# Patient Record
Sex: Male | Born: 1955 | Race: White | Hispanic: No | Marital: Married | Smoking: Former smoker
Health system: Southern US, Community
[De-identification: ages and names within clinical notes are randomized; demographics above are authoritative.]

## PROBLEM LIST (undated history)

## (undated) ENCOUNTER — Encounter: Attending: Cardiovascular Disease | Primary: Cardiovascular Disease

## (undated) ENCOUNTER — Encounter
Attending: Student in an Organized Health Care Education/Training Program | Primary: Student in an Organized Health Care Education/Training Program

## (undated) ENCOUNTER — Ambulatory Visit

## (undated) ENCOUNTER — Ambulatory Visit: Payer: MEDICARE | Attending: Cardiovascular Disease | Primary: Cardiovascular Disease

## (undated) ENCOUNTER — Telehealth

## (undated) ENCOUNTER — Ambulatory Visit: Payer: Medicare (Managed Care) | Attending: Cardiovascular Disease | Primary: Cardiovascular Disease

## (undated) ENCOUNTER — Encounter

## (undated) ENCOUNTER — Ambulatory Visit: Attending: Foot and Ankle Surgery | Primary: Foot and Ankle Surgery

## (undated) ENCOUNTER — Ambulatory Visit
Payer: Medicare (Managed Care) | Attending: Student in an Organized Health Care Education/Training Program | Primary: Student in an Organized Health Care Education/Training Program

## (undated) ENCOUNTER — Ambulatory Visit: Attending: Family Medicine | Primary: Family Medicine

## (undated) ENCOUNTER — Ambulatory Visit: Attending: Pharmacist | Primary: Pharmacist

## (undated) DIAGNOSIS — E7800 Pure hypercholesterolemia, unspecified: Secondary | ICD-10-CM

## (undated) DIAGNOSIS — G8929 Other chronic pain: Secondary | ICD-10-CM

## (undated) DIAGNOSIS — I739 Peripheral vascular disease, unspecified: Secondary | ICD-10-CM

## (undated) DIAGNOSIS — M1990 Unspecified osteoarthritis, unspecified site: Secondary | ICD-10-CM

## (undated) DIAGNOSIS — I213 ST elevation (STEMI) myocardial infarction of unspecified site: Secondary | ICD-10-CM

## (undated) DIAGNOSIS — I700 Atherosclerosis of aorta: Secondary | ICD-10-CM

## (undated) DIAGNOSIS — G4730 Sleep apnea, unspecified: Secondary | ICD-10-CM

## (undated) DIAGNOSIS — I209 Angina pectoris, unspecified: Secondary | ICD-10-CM

## (undated) DIAGNOSIS — Z972 Presence of dental prosthetic device (complete) (partial): Secondary | ICD-10-CM

## (undated) DIAGNOSIS — D171 Benign lipomatous neoplasm of skin and subcutaneous tissue of trunk: Secondary | ICD-10-CM

## (undated) DIAGNOSIS — G4733 Obstructive sleep apnea (adult) (pediatric): Secondary | ICD-10-CM

## (undated) DIAGNOSIS — N200 Calculus of kidney: Secondary | ICD-10-CM

## (undated) DIAGNOSIS — D696 Thrombocytopenia, unspecified: Secondary | ICD-10-CM

## (undated) DIAGNOSIS — E119 Type 2 diabetes mellitus without complications: Secondary | ICD-10-CM

## (undated) DIAGNOSIS — Z7902 Long term (current) use of antithrombotics/antiplatelets: Secondary | ICD-10-CM

## (undated) DIAGNOSIS — I10 Essential (primary) hypertension: Secondary | ICD-10-CM

## (undated) DIAGNOSIS — I2510 Atherosclerotic heart disease of native coronary artery without angina pectoris: Secondary | ICD-10-CM

## (undated) DIAGNOSIS — I771 Stricture of artery: Secondary | ICD-10-CM

## (undated) DIAGNOSIS — Z87442 Personal history of urinary calculi: Secondary | ICD-10-CM

## (undated) DIAGNOSIS — M19071 Primary osteoarthritis, right ankle and foot: Secondary | ICD-10-CM

## (undated) DIAGNOSIS — E785 Hyperlipidemia, unspecified: Secondary | ICD-10-CM

## (undated) DIAGNOSIS — K219 Gastro-esophageal reflux disease without esophagitis: Secondary | ICD-10-CM

## (undated) HISTORY — PX: APPENDECTOMY: SHX54

## (undated) HISTORY — DX: Hyperlipidemia, unspecified: E78.5

## (undated) HISTORY — PX: ESOPHAGOGASTRODUODENOSCOPY: SHX1529

## (undated) HISTORY — PX: COLONOSCOPY: SHX174

## (undated) HISTORY — PX: OTHER SURGICAL HISTORY: SHX169

## (undated) HISTORY — DX: Thrombocytopenia, unspecified: D69.6

## (undated) HISTORY — PX: NISSEN FUNDOPLICATION: SHX2091

## (undated) HISTORY — PX: HERNIA REPAIR: SHX51

## (undated) HISTORY — DX: Stricture of artery: I77.1

## (undated) HISTORY — PX: BACK SURGERY: SHX140

## (undated) HISTORY — PX: LUMBAR DISC SURGERY: SHX700

## (undated) HISTORY — DX: Personal history of urinary calculi: Z87.442

## (undated) HISTORY — PX: COLONOSCOPY WITH ESOPHAGOGASTRODUODENOSCOPY (EGD): SHX5779

## (undated) HISTORY — PX: FRACTURE SURGERY: SHX138

---

## 1980-06-30 HISTORY — PX: OTHER SURGICAL HISTORY: SHX169

## 2004-12-10 ENCOUNTER — Ambulatory Visit: Payer: Self-pay | Admitting: Unknown Physician Specialty

## 2004-12-12 ENCOUNTER — Ambulatory Visit: Payer: Self-pay | Admitting: Unknown Physician Specialty

## 2004-12-16 ENCOUNTER — Ambulatory Visit: Payer: Self-pay | Admitting: Unknown Physician Specialty

## 2004-12-19 ENCOUNTER — Ambulatory Visit: Payer: Self-pay | Admitting: Unknown Physician Specialty

## 2005-01-08 ENCOUNTER — Ambulatory Visit: Payer: Self-pay | Admitting: Unknown Physician Specialty

## 2005-01-21 ENCOUNTER — Inpatient Hospital Stay: Payer: Self-pay | Admitting: Surgery

## 2005-06-11 ENCOUNTER — Ambulatory Visit: Payer: Self-pay | Admitting: Surgery

## 2006-07-29 ENCOUNTER — Ambulatory Visit: Payer: Self-pay

## 2007-03-25 ENCOUNTER — Ambulatory Visit: Payer: Self-pay | Admitting: Surgery

## 2007-04-23 ENCOUNTER — Other Ambulatory Visit: Payer: Self-pay

## 2007-04-23 ENCOUNTER — Observation Stay: Payer: Self-pay | Admitting: Internal Medicine

## 2008-01-29 DIAGNOSIS — M5126 Other intervertebral disc displacement, lumbar region: Secondary | ICD-10-CM

## 2008-01-29 HISTORY — DX: Other intervertebral disc displacement, lumbar region: M51.26

## 2008-02-10 ENCOUNTER — Ambulatory Visit (HOSPITAL_COMMUNITY): Admission: RE | Admit: 2008-02-10 | Discharge: 2008-02-11 | Payer: Self-pay | Admitting: Orthopedic Surgery

## 2008-02-10 HISTORY — PX: LUMBAR DISC SURGERY: SHX700

## 2010-10-29 HISTORY — PX: LUMBAR LAMINECTOMY/DECOMPRESSION MICRODISCECTOMY: SHX5026

## 2010-11-12 NOTE — Op Note (Signed)
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   OPERATIVE PROCEDURE:  Lumbar laminectomy for diskectomy, CPT code 6030.   COMPLICATIONS:  None.   SURGEON:  Dahari D. Shon Baton, MD   FIRST ASSISTANT:  Crissie Reese, PA   HISTORY:  This is a very pleasant 55 year old gentleman who had acute  onset of severe left leg pain, weakness in his EHL, and positive nerve  root tension sign.  He had a previous L5-S1 disk herniation which did  well.  Because of the new radiculopathy, a new MRI was done which  demonstrated a central small disk protrusion at L5-S1 with no  significant neural compromise, but a significant large left-sided disk  herniation at L4-5 with compression of the traversing L5 nerve root.  After discussing treatment options, the patient elected to proceed with  surgery because of the radicular pain.  All appropriate risks, benefits,  and alternatives were discussed with the patient and consent was  obtained.   OPERATIVE NOTE:  The patient was brought to the operating room and  placed supine on the operating table.  After successful induction of  general anesthesia and endotracheal intubation, TED and SCDs were  applied.  He was turned prone onto a Wilson frame.  All bony prominences  were well padded and the back was prepped and draped in standard  fashion.  The previous incision was identified.  I then extended that  incision caudally cranially.  Sharp dissection was carried out down to  the deep fascia.  The deep fascia was sharply incised.  Then, I  used the  Cobb elevator to dissect the paraspinal muscles off the L4-5 spinous  processes.  At this point, I exposed the 4-5 interbody, the 4-5  ligamentum flavum, and the 4-5 space on the left side.  It should be  noted that prior to incision, we did do appropriate time-out to confirm  the appropriate level and I rechecked again with the MRI.   At this point with the 4-5 space exposed, I placed a Penfield 4  underneath the lamina of L4 and took an intraoperative x-ray, thereby  confirming that I was at the 4-5 space.  Once confirmed, I then proceed  with surgery.  A Taylor retractor was placed on the lateral aspect facet  joint in order to have exposure.  I then used a small curette to expand  a plane underneath the lamina and used a 3-mm Kerrison to perform a  small laminotomy of L4.  I then resected the ligamentum flavum to expose  the underlying thecal sac.  The five nerve root and the thecal sac was  dorsally displaced due to the  fragmented disk.  I could easily maneuver  the thecal sac out of the way and protect it with the D'Errico to expose  the very large posterolateral disk fragment.  At this point, I  coagulated the traversing epidural veins with a bipolar and then incised  the annulus with a 15-blade scalpel.  Using a combination of  micropituitary rongeurs and a regular 2-mm pituitary rongeur, I was able  to resect the large fragment of the disk material.  I then took the  micropituitary rongeur and entered into the actual disk space to remove  any loose fragments of disk material.   At this point, I had an adequate amount of disk material excised and so  I was swept circumferentially with an extended long nerve hook  underneath the thecal sac superiorly in the lateral recess and  inferiorly and then down along the lateral recess where the L5 nerve  root was.  At this point, there was no retained fragments of disk  material and there was no undue tension on the nerve root.   I then took  a dural elevator (hockey stick) and which gave me a longer extension  down to ensure that the L5 traversing nerve root was free of any  tension.  Once I had confirmed this, I irrigated copiously with normal  saline and then took another Epstein curette just to palpate directly  underneath the annulus to ensure that I had no free fragments of disk  material.  Once I was assured that I had all the fragments of disk  material out, I then checked the epidural veins, coagulating anything  that was bleeding with bipolar electrocautery, and then irrigated  copiously with normal saline.  I then used FloSeal to maintain  hemostasis, closed the deep fascia with interrupted #1 Vicryl sutures,  superficial with 2-0 Vicryl sutures, and a 3-0 Monocryl for the skin.  At the end of the case, all needle and sponge counts were counted and  were noted to be correct.  The patient after the dry dressing was  applied was transferred to the PACU without incident.      Alvy Beal, MD  Electronically Signed     DDB/MEDQ  D:  02/10/2008  T:  02/10/2008  Job:  [id number]

## 2010-11-20 ENCOUNTER — Ambulatory Visit: Payer: Self-pay | Admitting: Unknown Physician Specialty

## 2011-03-28 LAB — CBC
MCHC: 34.4
MCHC: 34.4
MCV: 90.8
Platelets: 132 — ABNORMAL LOW
RBC: 4.59
RDW: 13.8
WBC: 14.5 — ABNORMAL HIGH

## 2011-03-28 LAB — BASIC METABOLIC PANEL
BUN: 17
CO2: 25
CO2: 26
Calcium: 9
Chloride: 110
Creatinine, Ser: 0.64
Creatinine, Ser: 0.73
GFR calc Af Amer: >60
GFR calc non Af Amer: >60
Glucose, Bld: 130 — ABNORMAL HIGH

## 2011-06-10 ENCOUNTER — Ambulatory Visit: Payer: Self-pay | Admitting: Unknown Physician Specialty

## 2011-06-10 ENCOUNTER — Inpatient Hospital Stay: Payer: Self-pay | Admitting: Internal Medicine

## 2012-04-02 ENCOUNTER — Ambulatory Visit: Payer: Self-pay | Admitting: Unknown Physician Specialty

## 2013-03-06 ENCOUNTER — Emergency Department: Payer: Self-pay | Admitting: Emergency Medicine

## 2013-03-06 LAB — BASIC METABOLIC PANEL
BUN: 15 mg/dL (ref 7–18)
Chloride: 111 mmol/L — ABNORMAL HIGH (ref 98–107)
Co2: 25 mmol/L (ref 21–32)
Creatinine: 0.79 mg/dL (ref 0.60–1.30)
EGFR (Non-African Amer.): >60
Glucose: 146 mg/dL — ABNORMAL HIGH (ref 65–99)
Potassium: 4 mmol/L (ref 3.5–5.1)
Sodium: 142 mmol/L (ref 136–145)

## 2013-03-06 LAB — CBC
HCT: 44.4 % (ref 40.0–52.0)
MCH: 31.1 pg (ref 26.0–34.0)
MCHC: 34.3 g/dL (ref 32.0–36.0)
MCV: 91 fL (ref 80–100)
WBC: 7 10*3/uL (ref 3.8–10.6)

## 2013-03-06 LAB — TROPONIN I: Troponin-I: <0.02 ng/mL

## 2013-03-07 ENCOUNTER — Telehealth: Payer: Self-pay

## 2013-03-07 NOTE — Telephone Encounter (Signed)
Pt states he his seeing his current cardiologist on this Wednedsay. Pt would not give name of who he was seeing.

## 2013-03-07 NOTE — Telephone Encounter (Signed)
Called pt to schedule a follow up from ED over the weekend, pt states

## 2014-04-11 ENCOUNTER — Ambulatory Visit: Payer: Self-pay | Admitting: Urology

## 2014-08-01 DIAGNOSIS — E119 Type 2 diabetes mellitus without complications: Secondary | ICD-10-CM | POA: Insufficient documentation

## 2014-12-25 ENCOUNTER — Encounter: Payer: Self-pay | Admitting: *Deleted

## 2014-12-25 ENCOUNTER — Ambulatory Visit
Admission: RE | Admit: 2014-12-25 | Discharge: 2014-12-25 | Disposition: A | Discharge status: Home / Self Care | Payer: Medicare Other | Source: Ambulatory Visit | Attending: Unknown Physician Specialty | Admitting: Unknown Physician Specialty

## 2014-12-25 ENCOUNTER — Encounter
Admission: RE | Disposition: A | Discharge status: Home / Self Care | Payer: Self-pay | Source: Ambulatory Visit | Attending: Unknown Physician Specialty

## 2014-12-25 ENCOUNTER — Ambulatory Visit: Payer: Medicare Other | Admitting: Anesthesiology

## 2014-12-25 DIAGNOSIS — F17290 Nicotine dependence, other tobacco product, uncomplicated: Secondary | ICD-10-CM | POA: Diagnosis not present

## 2014-12-25 DIAGNOSIS — Z9049 Acquired absence of other specified parts of digestive tract: Secondary | ICD-10-CM | POA: Insufficient documentation

## 2014-12-25 DIAGNOSIS — Z9889 Other specified postprocedural states: Secondary | ICD-10-CM | POA: Insufficient documentation

## 2014-12-25 DIAGNOSIS — K317 Polyp of stomach and duodenum: Secondary | ICD-10-CM | POA: Diagnosis present

## 2014-12-25 DIAGNOSIS — Z79899 Other long term (current) drug therapy: Secondary | ICD-10-CM | POA: Insufficient documentation

## 2014-12-25 DIAGNOSIS — K219 Gastro-esophageal reflux disease without esophagitis: Secondary | ICD-10-CM | POA: Diagnosis not present

## 2014-12-25 DIAGNOSIS — K2950 Unspecified chronic gastritis without bleeding: Secondary | ICD-10-CM | POA: Insufficient documentation

## 2014-12-25 DIAGNOSIS — K319 Disease of stomach and duodenum, unspecified: Secondary | ICD-10-CM | POA: Diagnosis not present

## 2014-12-25 HISTORY — PX: ESOPHAGOGASTRODUODENOSCOPY (EGD) WITH PROPOFOL: SHX5813

## 2014-12-25 HISTORY — DX: Gastro-esophageal reflux disease without esophagitis: K21.9

## 2014-12-25 SURGERY — ESOPHAGOGASTRODUODENOSCOPY (EGD) WITH PROPOFOL
Anesthesia: General

## 2014-12-25 MED ORDER — LACTATED RINGERS IV SOLN
INTRAVENOUS | Status: DC
  Administered 2014-12-25: 10:00:00 via INTRAVENOUS

## 2014-12-25 MED ORDER — SODIUM CHLORIDE 0.9 % IV SOLN: INTRAVENOUS | Status: DC

## 2014-12-25 MED ORDER — PROPOFOL 10 MG/ML IV BOLUS
INTRAVENOUS | Status: DC | PRN
  Administered 2014-12-25: 50 mg via INTRAVENOUS

## 2014-12-25 MED ORDER — GLYCOPYRROLATE 0.2 MG/ML IJ SOLN
INTRAMUSCULAR | Status: DC | PRN
  Administered 2014-12-25: 0.2 mg via INTRAVENOUS

## 2014-12-25 MED ORDER — MIDAZOLAM HCL 5 MG/5ML IJ SOLN
INTRAMUSCULAR | Status: DC | PRN
  Administered 2014-12-25: 1 mg via INTRAVENOUS

## 2014-12-25 MED ORDER — PROPOFOL INFUSION 10 MG/ML OPTIME
INTRAVENOUS | Status: DC | PRN
  Administered 2014-12-25: 150 ug/kg/min via INTRAVENOUS

## 2014-12-25 MED ORDER — LIDOCAINE HCL (PF) 2 % IJ SOLN
INTRAMUSCULAR | Status: DC | PRN
  Administered 2014-12-25: 50 mg

## 2014-12-25 MED ORDER — FENTANYL CITRATE (PF) 100 MCG/2ML IJ SOLN
INTRAMUSCULAR | Status: DC | PRN
  Administered 2014-12-25: 50 ug via INTRAVENOUS

## 2014-12-25 NOTE — H&P (Signed)
Primary Care Physician:  Beaulah DinningFeldstein, Diana, MD Primary Gastroenterologist:  Dr. Mechele CollinElliott  Pre-Procedure History & Physical: HPI:  Curtis Sherman is a 59 y.o. male is here for an    Past Medical History  Diagnosis Date  . GERD (gastroesophageal reflux disease)     Past Surgical History  Procedure Laterality Date  . Back surgery    . Appendectomy    . Nissen fundoplication    . Fracture surgery      Prior to Admission medications   Medication Sig Start Date End Date Taking? Authorizing Provider  esomeprazole (NEXIUM) 20 MG capsule Take 20 mg by mouth daily at 12 noon.   Yes Historical Provider, MD  ezetimibe-simvastatin (VYTORIN) 10-40 MG per tablet Take 1 tablet by mouth daily.   Yes Historical Provider, MD    Allergies as of 11/03/2014  . (Not on File)    History reviewed. No pertinent family history.  History   Social History  . Marital Status: Married    Spouse Name: N/A  . Number of Children: N/A  . Years of Education: N/A   Occupational History  . Not on file.   Social History Main Topics  . Smoking status: Current Every Day Smoker    Types: Cigars  . Smokeless tobacco: Not on file  . Alcohol Use: No  . Drug Use: No  . Sexual Activity: Not on file   Other Topics Concern  . Not on file   Social History Narrative  . No narrative on file    Review of Systems: See HPI, otherwise negative ROS  Physical Exam: BP 124/69 mmHg  Pulse 51  Temp(Src) 96.6 F (35.9 C) (Tympanic)  Resp 18  Ht 6\' 1"  (1.854 m)  Wt 81.647 kg (180 lb)  BMI 23.75 kg/m2  SpO2 98% General:   Alert,  pleasant and cooperative in NAD Head:  Normocephalic and atraumatic. Neck:  Supple; no masses or thyromegaly. Lungs:  Clear throughout to auscultation.    Heart:  Regular rate and rhythm. Abdomen:  Soft, nontender and nondistended. Normal bowel sounds, without guarding, and without rebound.   Neurologic:  Alert and  oriented x4;  grossly normal  neurologically.  Impression/Plan: Curtis Sherman is here for an endoscopy to be performed for follow up duodenal polyp  Risks, benefits, limitations, and alternatives regarding  endoscopy have been reviewed with the patient.  Questions have been answered.  All parties agreeable.   Lynnae PrudeELLIOTT, Brynley Cuddeback, MD  12/25/2014, 10:10 AM   Primary Care Physician:  Beaulah DinningFeldstein, Diana, MD Primary Gastroenterologist:  Dr. Mechele CollinElliott  Pre-Procedure History & Physical: HPI:  Curtis Sherman is a 59 y.o. male is here for an endoscopy.   Past Medical History  Diagnosis Date  . GERD (gastroesophageal reflux disease)     Past Surgical History  Procedure Laterality Date  . Back surgery    . Appendectomy    . Nissen fundoplication    . Fracture surgery      Prior to Admission medications   Medication Sig Start Date End Date Taking? Authorizing Provider  esomeprazole (NEXIUM) 20 MG capsule Take 20 mg by mouth daily at 12 noon.   Yes Historical Provider, MD  ezetimibe-simvastatin (VYTORIN) 10-40 MG per tablet Take 1 tablet by mouth daily.   Yes Historical Provider, MD    Allergies as of 11/03/2014  . (Not on File)    History reviewed. No pertinent family history.  History   Social History  . Marital Status: Married  Spouse Name: N/A  . Number of Children: N/A  . Years of Education: N/A   Occupational History  . Not on file.   Social History Main Topics  . Smoking status: Current Every Day Smoker    Types: Cigars  . Smokeless tobacco: Not on file  . Alcohol Use: No  . Drug Use: No  . Sexual Activity: Not on file   Other Topics Concern  . Not on file   Social History Narrative  . No narrative on file    Review of Systems: See HPI, otherwise negative ROS  Physical Exam: BP 124/69 mmHg  Pulse 51  Temp(Src) 96.6 F (35.9 C) (Tympanic)  Resp 18  Ht  (1.854 m)  Wt 81.647 kg (180 lb)  BMI 23.75 kg/m2  SpO2 98% General:   Alert,  pleasant and cooperative in  NAD Head:  Normocephalic and atraumatic. Neck:  Supple; no masses or thyromegaly. Lungs:  Clear throughout to auscultation.    Heart:  Regular rate and rhythm. Abdomen:  Soft, nontender and nondistended. Normal bowel sounds, without guarding, and without rebound.   Neurologic:  Alert and  oriented x4;  grossly normal neurologically.  Impression/Plan: Curtis Sherman is here for an endoscopy to be performed for follow up duodenal polyp  Risks, benefits, limitations, and alternatives regarding  endoscopy have been reviewed with the patient.  Questions have been answered.  All parties agreeable.   Lynnae Prude, MD  12/25/2014, 10:10 AM

## 2014-12-25 NOTE — Op Note (Signed)
[facility] Patient Name: Curtis Sherman Procedure Date: 12/25/2014 10:15 AM MRN: [id number] Account #: [id number] Date of Birth: 19-Nov-1955 Admit Type: Outpatient Age: 59 Room: [facility] ENDO ROOM 1 Gender: Male Note Status: Finalized Procedure:         Upper GI endoscopy Indications:       follow up carcinoid of duodenum Providers:         Scot Jun, MD Referring MD:      Marina Goodell (Referring MD) Medicines:         Propofol per Anesthesia Complications:     No immediate complications. Procedure:         Pre-Anesthesia Assessment:                    - After reviewing the risks and benefits, the patient was                     deemed in satisfactory condition to undergo the procedure.                    After obtaining informed consent, the endoscope was passed                     under direct vision. Throughout the procedure, the                     patient's blood pressure, pulse, and oxygen saturations                     were monitored continuously. The Olympus GIF-160 endoscope                     (S#. [id number]) was introduced through the mouth, and                     advanced to the second part of duodenum. The upper GI                     endoscopy was accomplished without difficulty. The patient                     tolerated the procedure well. Findings:      The examined esophagus was normal. GEJ 43cm      Multiple localized, small non-bleeding erosions were found in the       gastric body. There were no stigmata of recent bleeding. Biopsies were       taken with a cold forceps for histology. Biopsies were taken with a cold       forceps for Helicobacter pylori testing.      Patchy mildly erythematous mucosa without active bleeding and with no       stigmata of bleeding was found in the duodenal bulb. Impression:        - Normal esophagus.                    - Non-bleeding erosive gastropathy. Biopsied.                    -  Normal examined duodenum. Recommendation:    - Await pathology results. Scot Jun, MD 12/25/2014 10:36:57 AM This report has been signed electronically. Number of Addenda: 0 Note Initiated On: 12/25/2014 10:15 AM      [facility]

## 2014-12-25 NOTE — Transfer of Care (Signed)
Immediate Anesthesia Transfer of Care Note  Patient: Curtis Sherman  Procedure(s) Performed: Procedure(s): ESOPHAGOGASTRODUODENOSCOPY (EGD) WITH PROPOFOL (N/A)  Patient Location: PACU  Anesthesia Type:General  Level of Consciousness: awake and alert   Airway & Oxygen Therapy: Patient Spontanous Breathing  Post-op Assessment: Report given to RN and Post -op Vital signs reviewed and stable  Post vital signs: Reviewed and stable  Last Vitals:  Filed Vitals:   12/25/14 1038  BP: 112/77  Pulse: 58  Temp:   Resp: 14    Complications: No apparent anesthesia complications

## 2014-12-25 NOTE — Anesthesia Preprocedure Evaluation (Signed)
Anesthesia Evaluation  Patient identified by MRN, date of birth, ID band Patient awake    Reviewed: Allergy & Precautions, H&P , NPO status , Patient's Chart, lab work & pertinent test results, reviewed documented beta blocker date and time   Airway Mallampati: II  TM Distance: >3 FB Neck ROM: full    Dental no notable dental hx.    Pulmonary neg pulmonary ROS, Current Smoker,  breath sounds clear to auscultation  Pulmonary exam normal       Cardiovascular Exercise Tolerance: Good negative cardio ROS  Rhythm:regular Rate:Normal     Neuro/Psych negative neurological ROS  negative psych ROS   GI/Hepatic negative GI ROS, Neg liver ROS, GERD-  ,  Endo/Other  negative endocrine ROS  Renal/GU negative Renal ROS  negative genitourinary   Musculoskeletal   Abdominal   Peds  Hematology negative hematology ROS (+)   Anesthesia Other Findings   Reproductive/Obstetrics negative OB ROS                             Anesthesia Physical Anesthesia Plan  ASA: II  Anesthesia Plan: General   Post-op Pain Management:    Induction:   Airway Management Planned:   Additional Equipment:   Intra-op Plan:   Post-operative Plan:   Informed Consent: I have reviewed the patients History and Physical, chart, labs and discussed the procedure including the risks, benefits and alternatives for the proposed anesthesia with the patient or authorized representative who has indicated his/her understanding and acceptance.   Dental Advisory Given  Plan Discussed with: CRNA  Anesthesia Plan Comments:         Anesthesia Quick Evaluation

## 2014-12-26 LAB — SURGICAL PATHOLOGY

## 2014-12-26 NOTE — Anesthesia Postprocedure Evaluation (Signed)
  Anesthesia Post-op Note  Patient: Curtis Sherman  Procedure(s) Performed: Procedure(s): ESOPHAGOGASTRODUODENOSCOPY (EGD) WITH PROPOFOL (N/A)  Anesthesia type:General  Patient location: PACU  Post pain: Pain level controlled  Post assessment: Post-op Vital signs reviewed, Patient's Cardiovascular Status Stable, Respiratory Function Stable, Patent Airway and No signs of Nausea or vomiting  Post vital signs: Reviewed and stable  Last Vitals:  Filed Vitals:   12/25/14 1100  BP: 138/85  Pulse: 54  Temp:   Resp: 19    Level of consciousness: awake, alert  and patient cooperative  Complications: No apparent anesthesia complications

## 2015-01-31 DIAGNOSIS — G8929 Other chronic pain: Secondary | ICD-10-CM | POA: Insufficient documentation

## 2015-01-31 DIAGNOSIS — M549 Dorsalgia, unspecified: Secondary | ICD-10-CM

## 2016-11-04 ENCOUNTER — Emergency Department
Admission: EM | Admit: 2016-11-04 | Discharge: 2016-11-04 | Disposition: A | Discharge status: Home / Self Care | Payer: Medicare HMO | Attending: Emergency Medicine | Admitting: Emergency Medicine

## 2016-11-04 ENCOUNTER — Emergency Department: Payer: Medicare HMO

## 2016-11-04 DIAGNOSIS — Y929 Unspecified place or not applicable: Secondary | ICD-10-CM | POA: Diagnosis not present

## 2016-11-04 DIAGNOSIS — S6992XA Unspecified injury of left wrist, hand and finger(s), initial encounter: Secondary | ICD-10-CM | POA: Diagnosis present

## 2016-11-04 DIAGNOSIS — W268XXA Contact with other sharp object(s), not elsewhere classified, initial encounter: Secondary | ICD-10-CM | POA: Diagnosis not present

## 2016-11-04 DIAGNOSIS — M659 Synovitis and tenosynovitis, unspecified: Secondary | ICD-10-CM | POA: Insufficient documentation

## 2016-11-04 DIAGNOSIS — F17290 Nicotine dependence, other tobacco product, uncomplicated: Secondary | ICD-10-CM | POA: Diagnosis not present

## 2016-11-04 DIAGNOSIS — S66992A Other injury of unspecified muscle, fascia and tendon at wrist and hand level, left hand, initial encounter: Secondary | ICD-10-CM

## 2016-11-04 DIAGNOSIS — Y999 Unspecified external cause status: Secondary | ICD-10-CM | POA: Insufficient documentation

## 2016-11-04 DIAGNOSIS — S61432A Puncture wound without foreign body of left hand, initial encounter: Secondary | ICD-10-CM | POA: Insufficient documentation

## 2016-11-04 DIAGNOSIS — Y9389 Activity, other specified: Secondary | ICD-10-CM | POA: Insufficient documentation

## 2016-11-04 DIAGNOSIS — Z23 Encounter for immunization: Secondary | ICD-10-CM | POA: Diagnosis not present

## 2016-11-04 LAB — CBC WITH DIFFERENTIAL/PLATELET
Basophils Absolute: 0.1 10*3/uL (ref 0–0.1)
Basophils Relative: 1 %
EOS ABS: 0.2 10*3/uL (ref 0–0.7)
Eosinophils Relative: 2 %
HEMATOCRIT: 49.3 % (ref 40.0–52.0)
HEMOGLOBIN: 17.1 g/dL (ref 13.0–18.0)
LYMPHS PCT: 28 %
Lymphs Abs: 2.5 10*3/uL (ref 1.0–3.6)
MCH: 30.8 pg (ref 26.0–34.0)
MCHC: 34.6 g/dL (ref 32.0–36.0)
MCV: 88.9 fL (ref 80.0–100.0)
MONO ABS: 0.7 10*3/uL (ref 0.2–1.0)
Monocytes Relative: 8 %
Neutro Abs: 5.7 10*3/uL (ref 1.4–6.5)
Neutrophils Relative %: 61 %
Platelets: 130 10*3/uL — ABNORMAL LOW (ref 150–440)
RBC: 5.55 MIL/uL (ref 4.40–5.90)
RDW: 13.7 % (ref 11.5–14.5)
WBC: 9.1 10*3/uL (ref 3.8–10.6)

## 2016-11-04 LAB — BASIC METABOLIC PANEL
Anion gap: 8 (ref 5–15)
BUN: 10 mg/dL (ref 6–20)
CALCIUM: 9.7 mg/dL (ref 8.9–10.3)
CO2: 27 mmol/L (ref 22–32)
Chloride: 104 mmol/L (ref 101–111)
Creatinine, Ser: 0.71 mg/dL (ref 0.61–1.24)
GFR calc Af Amer: >60 mL/min (ref >60)
GFR calc non Af Amer: >60 mL/min (ref >60)
GLUCOSE: 88 mg/dL (ref 65–99)
POTASSIUM: 4 mmol/L (ref 3.5–5.1)
SODIUM: 139 mmol/L (ref 135–145)

## 2016-11-04 MED ORDER — CEFTRIAXONE SODIUM IN DEXTROSE 20 MG/ML IV SOLN
1.0000 g | Freq: Once | INTRAVENOUS | Status: AC
  Administered 2016-11-04: 1 g via INTRAVENOUS
  Filled 2016-11-04: qty 50

## 2016-11-04 MED ORDER — AMOXICILLIN-POT CLAVULANATE 875-125 MG PO TABS: 1.0000 | ORAL_TABLET | Freq: Two times a day (BID) | ORAL | 0 refills | Status: DC

## 2016-11-04 MED ORDER — KETOROLAC TROMETHAMINE 10 MG PO TABS: 10.0000 mg | ORAL_TABLET | Freq: Three times a day (TID) | ORAL | 0 refills | Status: DC

## 2016-11-04 MED ORDER — ORPHENADRINE CITRATE 30 MG/ML IJ SOLN
60.0000 mg | INTRAMUSCULAR | Status: AC
  Administered 2016-11-04: 60 mg via INTRAMUSCULAR
  Filled 2016-11-04: qty 2

## 2016-11-04 MED ORDER — SULFAMETHOXAZOLE-TRIMETHOPRIM 800-160 MG PO TABS: 1.0000 | ORAL_TABLET | Freq: Two times a day (BID) | ORAL | 0 refills | Status: DC

## 2016-11-04 MED ORDER — KETOROLAC TROMETHAMINE 30 MG/ML IJ SOLN
30.0000 mg | Freq: Once | INTRAMUSCULAR | Status: AC
  Administered 2016-11-04: 30 mg via INTRAVENOUS
  Filled 2016-11-04: qty 1

## 2016-11-04 MED ORDER — TETANUS-DIPHTH-ACELL PERTUSSIS 5-2.5-18.5 LF-MCG/0.5 IM SUSP
0.5000 mL | Freq: Once | INTRAMUSCULAR | Status: AC
  Administered 2016-11-04: 0.5 mL via INTRAMUSCULAR
  Filled 2016-11-04: qty 0.5

## 2016-11-04 MED ORDER — CYCLOBENZAPRINE HCL 5 MG PO TABS: 5.0000 mg | ORAL_TABLET | Freq: Three times a day (TID) | ORAL | 0 refills | Status: DC | PRN
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
## 2016-11-04 NOTE — ED Notes (Signed)
D/c inst to pt.  Pt signed esignature.  Iv d/ced 
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
## 2016-11-04 NOTE — Discharge Instructions (Signed)
You are being treated for a severe inflammatory response after a puncture wound to the palm of the hand. This puncture caused injury and inflammation to the tendons of the fingers. Take the antibiotics as directed. Take the pain medicines as needed. Call Dr. Ernest PineHooten for wound check and follow-up tomorrow. Return to the ED for any concerning symptoms or developments in the interim.
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
## 2017-06-02 ENCOUNTER — Emergency Department
Admission: EM | Admit: 2017-06-02 | Discharge: 2017-06-02 | Disposition: A | Discharge status: Home / Self Care | Payer: No Typology Code available for payment source | Attending: Emergency Medicine | Admitting: Emergency Medicine

## 2017-06-02 ENCOUNTER — Encounter: Payer: Self-pay | Admitting: Emergency Medicine

## 2017-06-02 ENCOUNTER — Other Ambulatory Visit: Payer: Self-pay

## 2017-06-02 ENCOUNTER — Emergency Department: Payer: No Typology Code available for payment source

## 2017-06-02 DIAGNOSIS — R200 Anesthesia of skin: Secondary | ICD-10-CM | POA: Diagnosis not present

## 2017-06-02 DIAGNOSIS — Z79899 Other long term (current) drug therapy: Secondary | ICD-10-CM | POA: Insufficient documentation

## 2017-06-02 DIAGNOSIS — S199XXA Unspecified injury of neck, initial encounter: Secondary | ICD-10-CM | POA: Diagnosis present

## 2017-06-02 DIAGNOSIS — Y939 Activity, unspecified: Secondary | ICD-10-CM | POA: Diagnosis not present

## 2017-06-02 DIAGNOSIS — S161XXA Strain of muscle, fascia and tendon at neck level, initial encounter: Secondary | ICD-10-CM | POA: Insufficient documentation

## 2017-06-02 DIAGNOSIS — F17210 Nicotine dependence, cigarettes, uncomplicated: Secondary | ICD-10-CM | POA: Insufficient documentation

## 2017-06-02 DIAGNOSIS — Y929 Unspecified place or not applicable: Secondary | ICD-10-CM | POA: Insufficient documentation

## 2017-06-02 DIAGNOSIS — Y999 Unspecified external cause status: Secondary | ICD-10-CM | POA: Diagnosis not present

## 2017-06-02 MED ORDER — CYCLOBENZAPRINE HCL 5 MG PO TABS
5.0000 mg | ORAL_TABLET | Freq: Three times a day (TID) | ORAL | 0 refills | Status: DC | PRN
Start: 2017-06-02 — End: 2017-08-24

## 2017-06-02 MED ORDER — CYCLOBENZAPRINE HCL 10 MG PO TABS
10.0000 mg | ORAL_TABLET | Freq: Once | ORAL | Status: AC
  Administered 2017-06-02: 10 mg via ORAL
  Filled 2017-06-02: qty 1
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
## 2017-06-17 ENCOUNTER — Emergency Department
Admission: EM | Admit: 2017-06-17 | Discharge: 2017-06-17 | Disposition: A | Discharge status: Other Acute Inpatient Hospital | Payer: Medicare HMO | Attending: Internal Medicine | Admitting: Internal Medicine

## 2017-06-17 ENCOUNTER — Emergency Department: Payer: Medicare HMO

## 2017-06-17 ENCOUNTER — Inpatient Hospital Stay (HOSPITAL_COMMUNITY)
Admission: AD | Admit: 2017-06-17 | Discharge: 2017-06-24 | DRG: 236 | Disposition: A | Discharge status: Home / Self Care | Payer: Medicare HMO | Source: Other Acute Inpatient Hospital | Attending: Cardiothoracic Surgery | Admitting: Cardiothoracic Surgery

## 2017-06-17 ENCOUNTER — Encounter
Admission: EM | Disposition: A | Discharge status: Other Acute Inpatient Hospital | Payer: Self-pay | Source: Home / Self Care | Attending: Emergency Medicine

## 2017-06-17 ENCOUNTER — Other Ambulatory Visit: Payer: Self-pay

## 2017-06-17 ENCOUNTER — Inpatient Hospital Stay (HOSPITAL_COMMUNITY): Payer: Medicare HMO

## 2017-06-17 DIAGNOSIS — R079 Chest pain, unspecified: Secondary | ICD-10-CM | POA: Diagnosis present

## 2017-06-17 DIAGNOSIS — F17290 Nicotine dependence, other tobacco product, uncomplicated: Secondary | ICD-10-CM | POA: Diagnosis not present

## 2017-06-17 DIAGNOSIS — R001 Bradycardia, unspecified: Secondary | ICD-10-CM | POA: Diagnosis present

## 2017-06-17 DIAGNOSIS — E8770 Fluid overload, unspecified: Secondary | ICD-10-CM | POA: Diagnosis present

## 2017-06-17 DIAGNOSIS — I4891 Unspecified atrial fibrillation: Secondary | ICD-10-CM | POA: Diagnosis present

## 2017-06-17 DIAGNOSIS — I2119 ST elevation (STEMI) myocardial infarction involving other coronary artery of inferior wall: Secondary | ICD-10-CM

## 2017-06-17 DIAGNOSIS — D696 Thrombocytopenia, unspecified: Secondary | ICD-10-CM | POA: Diagnosis present

## 2017-06-17 DIAGNOSIS — I208 Other forms of angina pectoris: Secondary | ICD-10-CM | POA: Diagnosis present

## 2017-06-17 DIAGNOSIS — I25119 Atherosclerotic heart disease of native coronary artery with unspecified angina pectoris: Secondary | ICD-10-CM | POA: Diagnosis not present

## 2017-06-17 DIAGNOSIS — E119 Type 2 diabetes mellitus without complications: Secondary | ICD-10-CM | POA: Diagnosis present

## 2017-06-17 DIAGNOSIS — I10 Essential (primary) hypertension: Secondary | ICD-10-CM | POA: Insufficient documentation

## 2017-06-17 DIAGNOSIS — K219 Gastro-esophageal reflux disease without esophagitis: Secondary | ICD-10-CM | POA: Diagnosis not present

## 2017-06-17 DIAGNOSIS — I213 ST elevation (STEMI) myocardial infarction of unspecified site: Secondary | ICD-10-CM | POA: Insufficient documentation

## 2017-06-17 DIAGNOSIS — Z951 Presence of aortocoronary bypass graft: Secondary | ICD-10-CM

## 2017-06-17 DIAGNOSIS — J9811 Atelectasis: Secondary | ICD-10-CM | POA: Diagnosis not present

## 2017-06-17 DIAGNOSIS — F17210 Nicotine dependence, cigarettes, uncomplicated: Secondary | ICD-10-CM | POA: Diagnosis present

## 2017-06-17 DIAGNOSIS — E7800 Pure hypercholesterolemia, unspecified: Secondary | ICD-10-CM | POA: Diagnosis present

## 2017-06-17 DIAGNOSIS — Z79899 Other long term (current) drug therapy: Secondary | ICD-10-CM | POA: Insufficient documentation

## 2017-06-17 DIAGNOSIS — D62 Acute posthemorrhagic anemia: Secondary | ICD-10-CM | POA: Diagnosis not present

## 2017-06-17 DIAGNOSIS — I2510 Atherosclerotic heart disease of native coronary artery without angina pectoris: Secondary | ICD-10-CM

## 2017-06-17 DIAGNOSIS — E785 Hyperlipidemia, unspecified: Secondary | ICD-10-CM | POA: Diagnosis not present

## 2017-06-17 DIAGNOSIS — I361 Nonrheumatic tricuspid (valve) insufficiency: Secondary | ICD-10-CM

## 2017-06-17 DIAGNOSIS — Z09 Encounter for follow-up examination after completed treatment for conditions other than malignant neoplasm: Secondary | ICD-10-CM

## 2017-06-17 DIAGNOSIS — I480 Paroxysmal atrial fibrillation: Secondary | ICD-10-CM | POA: Diagnosis not present

## 2017-06-17 DIAGNOSIS — R946 Abnormal results of thyroid function studies: Secondary | ICD-10-CM | POA: Diagnosis present

## 2017-06-17 DIAGNOSIS — Z01810 Encounter for preprocedural cardiovascular examination: Secondary | ICD-10-CM | POA: Diagnosis not present

## 2017-06-17 HISTORY — DX: ST elevation (STEMI) myocardial infarction of unspecified site: I21.3

## 2017-06-17 HISTORY — DX: ST elevation (STEMI) myocardial infarction involving other coronary artery of inferior wall: I21.19

## 2017-06-17 HISTORY — DX: Pure hypercholesterolemia, unspecified: E78.00

## 2017-06-17 HISTORY — PX: CORONARY/GRAFT ACUTE MI REVASCULARIZATION: CATH118305

## 2017-06-17 HISTORY — PX: LEFT HEART CATH AND CORONARY ANGIOGRAPHY: CATH118249

## 2017-06-17 LAB — COMPREHENSIVE METABOLIC PANEL
ALBUMIN: 4 g/dL (ref 3.5–5.0)
ALBUMIN: 3.5 g/dL (ref 3.5–5.0)
ALK PHOS: 87 U/L (ref 38–126)
ALT: 38 U/L (ref 17–63)
ALT: 37 U/L (ref 17–63)
ANION GAP: 7 (ref 5–15)
AST: 39 U/L (ref 15–41)
AST: 37 U/L (ref 15–41)
Alkaline Phosphatase: 88 U/L (ref 38–126)
Anion gap: 3 — ABNORMAL LOW (ref 5–15)
BILIRUBIN TOTAL: 1 mg/dL (ref 0.3–1.2)
BUN: 12 mg/dL (ref 6–20)
BUN: 8 mg/dL (ref 6–20)
CALCIUM: 9.3 mg/dL (ref 8.9–10.3)
CHLORIDE: 107 mmol/L (ref 101–111)
CO2: 24 mmol/L (ref 22–32)
CO2: 24 mmol/L (ref 22–32)
CREATININE: 0.72 mg/dL (ref 0.61–1.24)
CREATININE: 0.71 mg/dL (ref 0.61–1.24)
Calcium: 9.3 mg/dL (ref 8.9–10.3)
Chloride: 108 mmol/L (ref 101–111)
GFR calc Af Amer: >60 mL/min (ref >60)
GFR calc Af Amer: >60 mL/min (ref >60)
GFR calc non Af Amer: >60 mL/min (ref >60)
GLUCOSE: 122 mg/dL — AB (ref 65–99)
GLUCOSE: 95 mg/dL (ref 65–99)
POTASSIUM: 3.9 mmol/L (ref 3.5–5.1)
Potassium: 4 mmol/L (ref 3.5–5.1)
SODIUM: 134 mmol/L — AB (ref 135–145)
Sodium: 139 mmol/L (ref 135–145)
TOTAL PROTEIN: 5.9 g/dL — AB (ref 6.5–8.1)
Total Bilirubin: 0.9 mg/dL (ref 0.3–1.2)
Total Protein: 6.6 g/dL (ref 6.5–8.1)

## 2017-06-17 LAB — CBC WITH DIFFERENTIAL/PLATELET
BASOS ABS: 0 10*3/uL (ref 0–0.1)
BASOS PCT: 0 %
Eosinophils Absolute: 0.1 10*3/uL (ref 0–0.7)
Eosinophils Relative: 1 %
HEMATOCRIT: 50 % (ref 40.0–52.0)
HEMOGLOBIN: 16.8 g/dL (ref 13.0–18.0)
LYMPHS PCT: 15 %
Lymphs Abs: 1.7 10*3/uL (ref 1.0–3.6)
MCH: 30.8 pg (ref 26.0–34.0)
MCHC: 33.5 g/dL (ref 32.0–36.0)
MCV: 92 fL (ref 80.0–100.0)
MONO ABS: 0.9 10*3/uL (ref 0.2–1.0)
Monocytes Relative: 8 %
NEUTROS ABS: 8.4 10*3/uL — AB (ref 1.4–6.5)
NEUTROS PCT: 76 %
Platelets: 117 10*3/uL — ABNORMAL LOW (ref 150–440)
RBC: 5.44 MIL/uL (ref 4.40–5.90)
RDW: 13.4 % (ref 11.5–14.5)
WBC: 11.2 10*3/uL — ABNORMAL HIGH (ref 3.8–10.6)

## 2017-06-17 LAB — LIPID PANEL
CHOL/HDL RATIO: 3.7 ratio
Cholesterol: 173 mg/dL (ref 0–200)
HDL: 47 mg/dL (ref >40)
LDL Cholesterol: 109 mg/dL — ABNORMAL HIGH (ref 0–99)
Triglycerides: 85 mg/dL (ref <150)
VLDL: 17 mg/dL (ref 0–40)

## 2017-06-17 LAB — APTT
APTT: 28 s (ref 24–36)
APTT: 45 s — AB (ref 24–36)

## 2017-06-17 LAB — POCT I-STAT 3, ART BLOOD GAS (G3+)
ACID-BASE DEFICIT: 1 mmol/L (ref 0.0–2.0)
Bicarbonate: 23 mmol/L (ref 20.0–28.0)
O2 SAT: 94 %
PCO2 ART: 37.4 mmHg (ref 32.0–48.0)
PH ART: 7.397 (ref 7.350–7.450)
TCO2: 24 mmol/L (ref 22–32)
pO2, Arterial: 70 mmHg — ABNORMAL LOW (ref 83.0–108.0)

## 2017-06-17 LAB — HEMOGLOBIN A1C
Hgb A1c MFr Bld: 6.1 % — ABNORMAL HIGH (ref 4.8–5.6)
Mean Plasma Glucose: 128.37 mg/dL

## 2017-06-17 LAB — TYPE AND SCREEN
ABO/RH(D): A POS
Antibody Screen: NEGATIVE

## 2017-06-17 LAB — PROTIME-INR
INR: 1.01
INR: 1.12
Prothrombin Time: 13.2 seconds (ref 11.4–15.2)
Prothrombin Time: 14.3 seconds (ref 11.4–15.2)

## 2017-06-17 LAB — TROPONIN I: TROPONIN I: 1.13 ng/mL — AB (ref <0.03)

## 2017-06-17 SURGERY — CORONARY/GRAFT ACUTE MI REVASCULARIZATION
Anesthesia: Moderate Sedation

## 2017-06-17 MED ORDER — PLASMA-LYTE 148 IV SOLN
INTRAVENOUS | Status: AC
  Filled 2017-06-17: qty 2.5

## 2017-06-17 MED ORDER — TEMAZEPAM 15 MG PO CAPS: 15.0000 mg | ORAL_CAPSULE | Freq: Once | ORAL | Status: DC | PRN

## 2017-06-17 MED ORDER — SODIUM CHLORIDE 0.9 % IV SOLN
30.0000 ug/min | INTRAVENOUS | Status: DC
  Filled 2017-06-17: qty 2

## 2017-06-17 MED ORDER — HEPARIN (PORCINE) IN NACL 2-0.9 UNIT/ML-% IJ SOLN
INTRAMUSCULAR | Status: AC | PRN
  Administered 2017-06-17: 1000 mL via INTRA_ARTERIAL

## 2017-06-17 MED ORDER — DEXMEDETOMIDINE HCL IN NACL 400 MCG/100ML IV SOLN
0.1000 ug/kg/h | INTRAVENOUS | Status: DC
  Filled 2017-06-17: qty 100

## 2017-06-17 MED ORDER — ACETAMINOPHEN 325 MG PO TABS: 650.0000 mg | ORAL_TABLET | ORAL | Status: DC | PRN

## 2017-06-17 MED ORDER — NITROGLYCERIN 0.4 MG SL SUBL: 0.4000 mg | SUBLINGUAL_TABLET | SUBLINGUAL | Status: DC | PRN

## 2017-06-17 MED ORDER — ASPIRIN EC 81 MG PO TBEC
81.0000 mg | DELAYED_RELEASE_TABLET | Freq: Every day | ORAL | Status: DC
  Administered 2017-06-18: 81 mg via ORAL
  Filled 2017-06-17: qty 1

## 2017-06-17 MED ORDER — HEPARIN (PORCINE) IN NACL 100-0.45 UNIT/ML-% IJ SOLN
1250.0000 [IU]/h | INTRAMUSCULAR | Status: DC
  Administered 2017-06-17: 1100 [IU]/h via INTRAVENOUS
  Administered 2017-06-18: 1250 [IU]/h via INTRAVENOUS
  Filled 2017-06-17: qty 250

## 2017-06-17 MED ORDER — ASPIRIN 81 MG PO CHEW
324.0000 mg | CHEWABLE_TABLET | ORAL | Status: AC
  Administered 2017-06-17: 324 mg via ORAL
  Filled 2017-06-17: qty 4

## 2017-06-17 MED ORDER — ONDANSETRON HCL 4 MG/2ML IJ SOLN: 4.0000 mg | Freq: Four times a day (QID) | INTRAMUSCULAR | Status: DC | PRN

## 2017-06-17 MED ORDER — CHLORHEXIDINE GLUCONATE CLOTH 2 % EX PADS: 6.0000 | MEDICATED_PAD | Freq: Once | CUTANEOUS | Status: DC

## 2017-06-17 MED ORDER — HEPARIN (PORCINE) IN NACL 100-0.45 UNIT/ML-% IJ SOLN
1100.0000 [IU]/h | INTRAMUSCULAR | Status: DC
  Administered 2017-06-17: 1100 [IU]/h via INTRAVENOUS

## 2017-06-17 MED ORDER — CHLORHEXIDINE GLUCONATE 0.12 % MT SOLN: 15.0000 mL | Freq: Once | OROMUCOSAL | Status: DC

## 2017-06-17 MED ORDER — CHLORHEXIDINE GLUCONATE CLOTH 2 % EX PADS
6.0000 | MEDICATED_PAD | Freq: Once | CUTANEOUS | Status: AC
  Administered 2017-06-17: 6 via TOPICAL

## 2017-06-17 MED ORDER — BISACODYL 5 MG PO TBEC: 5.0000 mg | DELAYED_RELEASE_TABLET | Freq: Once | ORAL | Status: DC

## 2017-06-17 MED ORDER — SODIUM CHLORIDE 0.9 % IV SOLN
INTRAVENOUS | Status: DC
  Administered 2017-06-17: 10 mL/h via INTRAVENOUS

## 2017-06-17 MED ORDER — VANCOMYCIN HCL 10 G IV SOLR
1500.0000 mg | INTRAVENOUS | Status: DC
  Filled 2017-06-17: qty 1500

## 2017-06-17 MED ORDER — EPINEPHRINE PF 1 MG/ML IJ SOLN
0.0000 ug/min | INTRAVENOUS | Status: DC
  Filled 2017-06-17: qty 4

## 2017-06-17 MED ORDER — MILRINONE LACTATE IN DEXTROSE 20-5 MG/100ML-% IV SOLN
0.1250 ug/kg/min | INTRAVENOUS | Status: AC
  Filled 2017-06-17: qty 100

## 2017-06-17 MED ORDER — HEPARIN SODIUM (PORCINE) 5000 UNIT/ML IJ SOLN: 60.0000 [IU]/kg | Freq: Once | INTRAMUSCULAR | Status: DC

## 2017-06-17 MED ORDER — MAGNESIUM SULFATE 50 % IJ SOLN
40.0000 meq | INTRAMUSCULAR | Status: DC
  Filled 2017-06-17 (×2): qty 9.85

## 2017-06-17 MED ORDER — POTASSIUM CHLORIDE 2 MEQ/ML IV SOLN
80.0000 meq | INTRAVENOUS | Status: DC
  Filled 2017-06-17: qty 40

## 2017-06-17 MED ORDER — DEXTROSE 5 % IV SOLN
1.5000 g | INTRAVENOUS | Status: DC
  Filled 2017-06-17: qty 1.5

## 2017-06-17 MED ORDER — CEFUROXIME SODIUM 750 MG IJ SOLR
750.0000 mg | INTRAMUSCULAR | Status: DC
  Filled 2017-06-17: qty 750

## 2017-06-17 MED ORDER — HEPARIN SODIUM (PORCINE) 5000 UNIT/ML IJ SOLN
4000.0000 [IU] | Freq: Once | INTRAMUSCULAR | Status: AC
  Administered 2017-06-17: 4000 [IU] via INTRAVENOUS

## 2017-06-17 MED ORDER — IOPAMIDOL (ISOVUE-300) INJECTION 61%
INTRAVENOUS | Status: DC | PRN
Start: 2017-06-17 — End: 2017-06-17
  Administered 2017-06-17: 125 mL via INTRA_ARTERIAL

## 2017-06-17 MED ORDER — SODIUM CHLORIDE 0.9 % IV SOLN
INTRAVENOUS | Status: DC
  Filled 2017-06-17: qty 1

## 2017-06-17 MED ORDER — METOPROLOL TARTRATE 12.5 MG HALF TABLET: 12.5000 mg | ORAL_TABLET | Freq: Once | ORAL | Status: DC

## 2017-06-17 MED ORDER — DOPAMINE-DEXTROSE 3.2-5 MG/ML-% IV SOLN
0.0000 ug/kg/min | INTRAVENOUS | Status: DC
  Filled 2017-06-17: qty 250

## 2017-06-17 MED ORDER — HEPARIN (PORCINE) IN NACL 100-0.45 UNIT/ML-% IJ SOLN
INTRAMUSCULAR | Status: AC
  Filled 2017-06-17: qty 250

## 2017-06-17 MED ORDER — NITROGLYCERIN IN D5W 200-5 MCG/ML-% IV SOLN
2.0000 ug/min | INTRAVENOUS | Status: DC
  Filled 2017-06-17: qty 250

## 2017-06-17 MED ORDER — BIVALIRUDIN TRIFLUOROACETATE 250 MG IV SOLR
INTRAVENOUS | Status: AC
  Filled 2017-06-17: qty 250

## 2017-06-17 MED ORDER — HEPARIN (PORCINE) IN NACL 2-0.9 UNIT/ML-% IJ SOLN
INTRAMUSCULAR | Status: AC
  Filled 2017-06-17: qty 500

## 2017-06-17 MED ORDER — TRANEXAMIC ACID 1000 MG/10ML IV SOLN
1.5000 mg/kg/h | INTRAVENOUS | Status: DC
  Filled 2017-06-17: qty 25

## 2017-06-17 MED ORDER — TRANEXAMIC ACID (OHS) PUMP PRIME SOLUTION
2.0000 mg/kg | INTRAVENOUS | Status: DC
  Filled 2017-06-17: qty 1.67

## 2017-06-17 MED ORDER — ASPIRIN 300 MG RE SUPP: 300.0000 mg | RECTAL | Status: AC

## 2017-06-17 MED ORDER — SODIUM CHLORIDE 0.9 % IV SOLN
INTRAVENOUS | Status: DC
  Filled 2017-06-17: qty 30

## 2017-06-17 MED ORDER — NITROGLYCERIN 5 MG/ML IV SOLN
INTRAVENOUS | Status: AC
  Filled 2017-06-17: qty 10

## 2017-06-17 MED ORDER — TRANEXAMIC ACID (OHS) BOLUS VIA INFUSION
15.0000 mg/kg | INTRAVENOUS | Status: DC
  Filled 2017-06-17: qty 1250

## 2017-06-17 MED ORDER — SODIUM CHLORIDE 0.9 % IV SOLN: INTRAVENOUS | Status: DC

## 2017-06-17 SURGICAL SUPPLY — 10 items
CATH 5FR JL4 DIAGNOSTIC (CATHETERS) ×3 IMPLANT
CATH INFINITI 5FR ANG PIGTAIL (CATHETERS) ×3 IMPLANT
CATH INFINITI JR4 5F (CATHETERS) ×3 IMPLANT
DEVICE CLOSURE MYNXGRIP 6/7F (Vascular Products) ×3 IMPLANT
DEVICE INFLAT 30 PLUS (MISCELLANEOUS) ×3 IMPLANT
GUIDEWIRE 3MM J TIP .035 145 (WIRE) ×3 IMPLANT
KIT MANI 3VAL PERCEP (MISCELLANEOUS) ×3 IMPLANT
NEEDLE PERC 18GX7CM (NEEDLE) ×3 IMPLANT
PACK CARDIAC CATH (CUSTOM PROCEDURE TRAY) ×3 IMPLANT
SHEATH AVANTI 6FR X 11CM (SHEATH) ×3 IMPLANT

## 2017-06-17 NOTE — Anesthesia Preprocedure Evaluation (Addendum)
Anesthesia Evaluation    Reviewed: Allergy & Precautions, NPO status , Patient's Chart, lab work & pertinent test results, reviewed documented beta blocker date and time   Airway Mallampati: II  TM Distance: >3 FB Neck ROM: Full    Dental  (+) Dental Advisory Given, Edentulous Upper, Partial Lower   Pulmonary Current Smoker,    Pulmonary exam normal breath sounds clear to auscultation       Cardiovascular hypertension, + CAD and + Past MI  Normal cardiovascular exam Rhythm:Regular Rate:Normal  TTE 2018 - Mild LVH, EF 55-60%, possible inferolateral hypokinesis  Cath 2018 -  Dist RCA lesion is 95% stenosed.  Prox RCA lesion is 60% stenosed.  Prox RCA to Mid RCA lesion is 50% stenosed.  Mid RCA lesion is 50% stenosed.  Ost 1st Mrg lesion is 100% stenosed.  Ost LAD to Prox LAD lesion is 75% stenosed.  Ost 1st Diag lesion is 80% stenosed.   Neuro/Psych negative neurological ROS     GI/Hepatic Neg liver ROS, GERD  Medicated and Controlled,S/p Nissen   Endo/Other  Pre-DM  Renal/GU negative Renal ROS     Musculoskeletal   Abdominal   Peds  Hematology  (+) Blood dyscrasia (thrombocytopenia), ,   Anesthesia Other Findings   Reproductive/Obstetrics                           Lab Results  Component Value Date   WBC 11.2 (H) 06/17/2017   HGB 16.8 06/17/2017   HCT 50.0 06/17/2017   MCV 92.0 06/17/2017   PLT 117 (L) 06/17/2017   Lab Results  Component Value Date   CREATININE 0.72 06/17/2017   BUN 12 06/17/2017   NA 134 (L) 06/17/2017   K 3.9 06/17/2017   CL 107 06/17/2017   CO2 24 06/17/2017    Anesthesia Physical Anesthesia Plan  ASA: IV  Anesthesia Plan: General   Post-op Pain Management:    Induction: Intravenous  PONV Risk Score and Plan: Treatment may vary due to age or medical condition  Airway Management Planned: Oral ETT  Additional Equipment: Arterial line, CVP,  PA Cath and Ultrasound Guidance Line Placement  Intra-op Plan:   Post-operative Plan: Post-operative intubation/ventilation  Informed Consent: I have reviewed the patients History and Physical, chart, labs and discussed the procedure including the risks, benefits and alternatives for the proposed anesthesia with the patient or authorized representative who has indicated his/her understanding and acceptance.   Dental advisory given  Plan Discussed with: CRNA and Surgeon  Anesthesia Plan Comments: (Will avoid TEE given hx Nissen, unless absolutely required for diagnostic purposes intraoperatively)     Anesthesia Quick Evaluation

## 2017-06-17 NOTE — Progress Notes (Signed)
  Echocardiogram 2D Echocardiogram has been performed.  [name], Curtis Sherman R 06/17/2017, 10:22 PM
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
## 2017-06-17 NOTE — ED Provider Notes (Signed)
[facility] Emergency Department Provider Note  ____________________________________________   First MD Initiated Contact with Patient 06/17/17 1719     (approximate)  I have reviewed the triage vital signs and the nursing notes.   HISTORY  Chief Complaint Chest Pain   HPI Curtis Sherman is a [age] y.o. male with a history of high cholesterol who is chest pain that started suddenly about 1 hour previously.  He says the pain was a 10 out of 10 but after 2 nitroglycerin and in route with EMS it is now a 4 out of 10.  He was also given 324 mg of aspirin prior to arrival.  Patient says the pain is radiating down both arms.  Says that he has a history of high cholesterol but no previous coronary artery disease.  Has a brother who has had cardiac bypass surgery.   Past Medical History:  Diagnosis Date  . GERD (gastroesophageal reflux disease)   . High cholesterol     There are no active problems to display for this patient.   Past Surgical History:  Procedure Laterality Date  . APPENDECTOMY    . BACK SURGERY    . ESOPHAGOGASTRODUODENOSCOPY (EGD) WITH PROPOFOL N/A 12/25/2014   Procedure: ESOPHAGOGASTRODUODENOSCOPY (EGD) WITH PROPOFOL;  Surgeon: Scot Junobert T Elliott, MD;  [facility] ENDOSCOPY;  Service: Endoscopy;  Laterality: N/A;  . FRACTURE SURGERY    . HERNIA REPAIR    . NISSEN FUNDOPLICATION      Prior to Admission medications   Medication Sig Start Date End Date Taking? Authorizing Provider  cyclobenzaprine (FLEXERIL) 5 MG tablet Take 1 tablet (5 mg total) by mouth 3 (three) times daily as needed for muscle spasms. 06/02/17   Menshew, Charlesetta IvoryJenise V Bacon, PA-C  ezetimibe-simvastatin (VYTORIN) 10-40 MG per tablet Take 1 tablet by mouth daily.    [provider]  ketorolac (TORADOL) 10 MG tablet Take 1 tablet (10 mg total) by mouth every 8 (eight) hours. 11/04/16   Menshew, Charlesetta IvoryJenise V Bacon, PA-C  pantoprazole (PROTONIX) 40 MG tablet Take 40 mg by  mouth daily.    [provider]    Allergies Codeine  History reviewed. No pertinent family history.  Social History Social History   Tobacco Use  . Smoking status: Current Every Day Smoker    Types: Cigars  . Smokeless tobacco: Never Used  Substance Use Topics  . Alcohol use: No  . Drug use: No    Review of Systems  Constitutional: No fever/chills Eyes: No visual changes. ENT: No sore throat. Cardiovascular: Denies chest pain. Respiratory: As above Gastrointestinal: No abdominal pain.  No nausea, no vomiting.  No diarrhea.  No constipation. Genitourinary: Negative for dysuria. Musculoskeletal: Negative for back pain. Skin: Negative for rash. Neurological: Negative for headaches, focal weakness or numbness.   ____________________________________________   PHYSICAL EXAM:  VITAL SIGNS: ED Triage Vitals  Enc Vitals Group     BP 06/17/17 1719 (!) 145/84     Pulse Rate 06/17/17 1719 65     Resp 06/17/17 1719 14     Temp 06/17/17 1719 98.3 F (36.8 C)     Temp Source 06/17/17 1719 Oral     SpO2 06/17/17 1719 97 %     Weight 06/17/17 1723 185 lb (83.9 kg)     Height 06/17/17 1723 6\' 1"  (1.854 m)     Head Circumference --      Peak Flow --      Pain Score 06/17/17 1712 4  Pain Loc --      Pain Edu? --      Excl. in GC? --     Constitutional: Alert and oriented. Well appearing and in no acute distress. Eyes: Conjunctivae are normal.  Head: Atraumatic. Nose: No congestion/rhinnorhea. Mouth/Throat: Mucous membranes are moist.  Neck: No stridor.   Cardiovascular: Normal rate, regular rhythm. Grossly normal heart sounds.  Good peripheral circulation with equal and bilateral radial pulses Respiratory: Normal respiratory effort.  No retractions. Lungs CTAB. Gastrointestinal: Soft and nontender. No distention.  Musculoskeletal: No lower extremity tenderness nor edema.  No joint effusions. Neurologic:  Normal speech and language. No gross focal  neurologic deficits are appreciated. Skin:  Skin is warm, dry and intact. No rash noted. Psychiatric: Mood and affect are normal. Speech and behavior are normal.  ____________________________________________   LABS (all labs ordered are listed, but only abnormal results are displayed)  Labs Reviewed  CBC WITH DIFFERENTIAL/PLATELET - Abnormal; Notable for the following components:      Result Value   WBC 11.2 (*)    Platelets 117 (*)    Neutro Abs 8.4 (*)    All other components within normal limits  PROTIME-INR  APTT  COMPREHENSIVE METABOLIC PANEL  TROPONIN I  LIPID PANEL   ____________________________________________  EKG  ED ECG REPORT I, Arelia Longestavid M Ciin Brazzel, the attending physician, personally viewed and interpreted this ECG.   Date: 06/17/2017  EKG Time: 1658  Rate: 70  Rhythm: normal sinus rhythm  Axis: normal  Intervals:none  ST&T Change: T wave peaking in V2 and V3 with minimal depression in 2 3 and aVF with T wave inversion in 2 3, aVF as well as V5 and V6.  ED ECG REPORT I, Arelia Longestavid M Timmy Bubeck, the attending physician, personally viewed and interpreted this ECG.   Date: 06/17/2017  EKG Time: 1713  Rate: 63  Rhythm: normal sinus rhythm  Axis: Normal  Intervals:none  ST&T Change: Hyperacute T waves in V2 with ST depression in V5 and V6 with deep T wave inversions in 3 and aVF.  Compared with previous EKG and the T wave inversions and depressions are new.   ____________________________________________  RADIOLOGY  No acute finding on the chest x-ray ____________________________________________   PROCEDURES  Procedure(s) performed:   Procedures  Critical Care performed:  CRITICAL CARE Performed by: Arelia Longestavid M Jamez Ambrocio   Total critical care time: 35 minutes  Critical care time was exclusive of separately billable procedures and treating other patients.  Critical care was necessary to treat or prevent imminent or life-threatening  deterioration.  Critical care was time spent personally by me on the following activities: development of treatment plan with patient and/or surrogate as well as nursing, discussions with consultants, evaluation of patient's response to treatment, examination of patient, obtaining history from patient or surrogate, ordering and performing treatments and interventions, ordering and review of laboratory studies, ordering and review of radiographic studies, pulse oximetry and re-evaluation of patient's condition.   ____________________________________________   INITIAL IMPRESSION / ASSESSMENT AND PLAN / ED COURSE  Pertinent labs & imaging results that were available during my care of the patient were reviewed by me and considered in my medical decision making (see chart for details).  Differential diagnosis includes, but is not limited to, ACS, aortic dissection, pulmonary embolism, cardiac tamponade, pneumothorax, pneumonia, pericarditis, myocarditis, GI-related causes including esophagitis/gastritis, and musculoskeletal chest wall pain.   As part of my medical decision making, I reviewed the following data within the electronic MEDICAL RECORD NUMBER Notes  from prior ED visits  STEMI alert called and I discussed the case with Dr. Darrold Junker who says that he is on his way to the emergency department.    ----------------------------------------- 5:38 PM on 06/17/2017 -----------------------------------------  Cardiologist at the bedside and will be taking the patient to the Cath Lab.  Patient would not like any pain meds at this time.  Says that he does not need them.  He was administered 4000 units of heparin prior to leaving the emergency department.  Also discussed the case with Dr. Nemiah Commander of the medicine service.  ____________________________________________   FINAL CLINICAL IMPRESSION(S) / ED DIAGNOSES  STEMI   NEW MEDICATIONS STARTED DURING THIS VISIT:  This SmartLink is deprecated.  Use AVSMEDLIST instead to display the medication list for a patient.   Note:  This document was prepared using Dragon voice recognition software and may include unintentional dictation errors.     Myrna Blazer, MD 06/17/17 1739

## 2017-06-17 NOTE — ED Notes (Signed)
Dr. Parachos at bedside.  

## 2017-06-17 NOTE — ED Provider Notes (Signed)
received a call from Dr. Darrold JunkerParaschos is taking care of this patient in the catheter lab. Patient is planned to be transferred to [facility] for CABG due to three-vessel disease. Currently stabilized per Dr. Darrold JunkerParaschos. Due to the complexity of the electronic medical record, he is unable to complete the EMTALA form for transfer, which has been arranged.  I entered the form based on information provided by phone to help facilitate care and overcome EMR shortcomings.   Sharman CheekStafford, Mahli Glahn, MD 06/17/17 1949

## 2017-06-17 NOTE — Progress Notes (Signed)
eLink Physician-Brief Progress Note Patient Name: Curtis Sherman DOB: 14-Apr-1956 MRN: [id number]   Date of Service  06/17/2017  HPI/Events of Note  [facility] with STEMI. Taken to cath lab, found to have 3V CAD, [facility] for [facility] management.  Camera current not active. Pt is hemodynamically stable, on RA.   eICU Interventions  No recommendations at this time.         [name] 06/17/2017, 9:12 PM

## 2017-06-17 NOTE — Progress Notes (Addendum)
ANTICOAGULATION CONSULT NOTE - Initial Consult  Pharmacy Consult for Heparin  Indication: Three vessel CAD awaiting CABG  Allergies  Allergen Reactions  . Codeine Nausea Only    Patient Measurements: Height: 6\' 1"  (185.4 cm) Weight: 183 lb 10.3 oz (83.3 kg) IBW/kg (Calculated) : 79.9 Vital Signs: Temp: 98.6 F (37 C) (12/19 2035) Temp Source: Oral (12/19 2035) BP: 154/77 (12/19 2035) Pulse Rate: 63 (12/19 2035)  Labs: Recent Labs    06/17/17 1718  HGB 16.8  HCT 50.0  PLT 117*  APTT 28  LABPROT 13.2  INR 1.01  CREATININE 0.72  TROPONINI 1.13*    Estimated Creatinine Clearance: 108.2 mL/min (by C-G formula based on SCr of 0.72 mg/dL).   Medical History: Past Medical History:  Diagnosis Date  . GERD (gastroesophageal reflux disease)   . High cholesterol     Assessment: 61 y/o M transfer from [place] s/p cath on heparin at 1100 units/hr, awaiting CABG 12/20 AM, cath site looks ok per RN, CBC/renal function good  Goal of Therapy:  Heparin level 0.3-0.7 units/ml Monitor platelets by anticoagulation protocol: Yes   Plan:  Cont heparin at 1100 units/hr 0500 HL  Jeanetta Alonzo 06/17/2017,11:27 PM   =========================== Addendum 6:29 AM Initial heparin level this AM is just below goal at 0.28 -Inc heparin to 1250 units/hr -1300 HL Abran DukeJames Amrita Radu, PharmD, BCPS Clinical Pharmacist Phone: [phone] ============================

## 2017-06-17 NOTE — ED Triage Notes (Signed)
Pt arrives via [facility] for chest pain that is all across chest and down L arm. Pt wasn't exerting self when pain began. Took 4 baby ASA, 2 nitros PTA. Arrives 18 L AC. Doesn't wear oxygen. No N&V. Alert, oriented.

## 2017-06-17 NOTE — ED Notes (Signed)
Pt denies cardiac hx. Brother has cardiac hx. Pt denies blood thinner use.

## 2017-06-17 NOTE — ED Notes (Signed)
Pt transferred to cath lab.

## 2017-06-18 ENCOUNTER — Other Ambulatory Visit: Payer: Self-pay

## 2017-06-18 ENCOUNTER — Inpatient Hospital Stay (HOSPITAL_COMMUNITY): Payer: Medicare HMO

## 2017-06-18 ENCOUNTER — Encounter: Payer: Self-pay | Admitting: Cardiology

## 2017-06-18 DIAGNOSIS — Z01810 Encounter for preprocedural cardiovascular examination: Secondary | ICD-10-CM

## 2017-06-18 DIAGNOSIS — I213 ST elevation (STEMI) myocardial infarction of unspecified site: Secondary | ICD-10-CM

## 2017-06-18 DIAGNOSIS — I2510 Atherosclerotic heart disease of native coronary artery without angina pectoris: Secondary | ICD-10-CM

## 2017-06-18 DIAGNOSIS — I779 Disorder of arteries and arterioles, unspecified: Secondary | ICD-10-CM

## 2017-06-18 HISTORY — DX: Disorder of arteries and arterioles, unspecified: I77.9

## 2017-06-18 LAB — ECHOCARDIOGRAM COMPLETE
E decel time: 275 msec
E/e' ratio: 9.42
FS: 22 % — AB (ref 28–44)
Height: 73 in
IVS/LV PW RATIO, ED: 1.11
LA ID, A-P, ES: 31 mm
LA diam end sys: 31 mm
LA diam index: 1.49 cm/m2
LA vol A4C: 41.4 ml
LA vol index: 21.8 mL/m2
LA vol: 45.4 mL
LV E/e' medial: 9.42
LV E/e'average: 9.42
LV PW d: 12.9 mm — AB (ref 0.6–1.1)
LV dias vol index: 42 mL/m2
LV dias vol: 87 mL (ref 62–150)
LV e' LATERAL: 9.14 cm/s
LV sys vol index: 20 mL/m2
LV sys vol: 41 mL (ref 21–61)
LVOT SV: 90 mL
LVOT VTI: 26 cm
LVOT area: 3.46 cm2
LVOT diameter: 21 mm
LVOT peak grad rest: 5 mmHg
LVOT peak vel: 114 cm/s
Lateral S' vel: 8.38 cm/s
MV Dec: 275
MV Peak grad: 3 mmHg
MV pk A vel: 104 m/s
MV pk E vel: 86.1 m/s
Simpson's disk: 53
Stroke v: 46 ml
TAPSE: 17.5 mm
TDI e' lateral: 9.14
TDI e' medial: 5
Weight: 2938.29 oz

## 2017-06-18 LAB — PULMONARY FUNCTION TEST
FEF 25-75 POST: 3.73 L/s
FEF 25-75 Pre: 2.8 L/sec
FEF2575-%Change-Post: 33 %
FEF2575-%PRED-POST: 119 %
FEF2575-%Pred-Pre: 89 %
FEV1-%CHANGE-POST: 5 %
FEV1-%Pred-Post: 80 %
FEV1-%Pred-Pre: 76 %
FEV1-POST: 3.16 L
FEV1-Pre: 2.99 L
FEV1FVC-%Change-Post: 5 %
FEV1FVC-%PRED-PRE: 105 %
FEV6-%Change-Post: 3 %
FEV6-%PRED-POST: 76 %
FEV6-%Pred-Pre: 73 %
FEV6-POST: 3.78 L
FEV6-PRE: 3.65 L
FEV6FVC-%CHANGE-POST: 2 %
FEV6FVC-%PRED-PRE: 102 %
FEV6FVC-%Pred-Post: 104 %
FVC-%CHANGE-POST: 0 %
FVC-%PRED-POST: 73 %
FVC-%PRED-PRE: 72 %
FVC-POST: 3.8 L
FVC-PRE: 3.78 L
POST FEV1/FVC RATIO: 83 %
PRE FEV6/FVC RATIO: 97 %
Post FEV6/FVC ratio: 100 %
Pre FEV1/FVC ratio: 79 %

## 2017-06-18 LAB — BLOOD GAS, ARTERIAL

## 2017-06-18 LAB — URINALYSIS, ROUTINE W REFLEX MICROSCOPIC
BACTERIA UA: NONE SEEN
Bilirubin Urine: NEGATIVE
Glucose, UA: NEGATIVE mg/dL
Ketones, ur: NEGATIVE mg/dL
Leukocytes, UA: NEGATIVE
Nitrite: NEGATIVE
Protein, ur: NEGATIVE mg/dL
SPECIFIC GRAVITY, URINE: 1.033 — AB (ref 1.005–1.030)
SQUAMOUS EPITHELIAL / LPF: NONE SEEN
pH: 6 (ref 5.0–8.0)

## 2017-06-18 LAB — CBC
HEMATOCRIT: 47.6 % (ref 39.0–52.0)
Hemoglobin: 16.4 g/dL (ref 13.0–17.0)
MCH: 31.6 pg (ref 26.0–34.0)
MCHC: 34.5 g/dL (ref 30.0–36.0)
MCV: 91.7 fL (ref 78.0–100.0)
Platelets: 116 10*3/uL — ABNORMAL LOW (ref 150–400)
RBC: 5.19 MIL/uL (ref 4.22–5.81)
RDW: 13.6 % (ref 11.5–15.5)
WBC: 7.1 10*3/uL (ref 4.0–10.5)

## 2017-06-18 LAB — VAS US DOPPLER PRE CABG
LEFT ECA DIAS: -20 cm/s
LEFT VERTEBRAL DIAS: -14 cm/s
Left CCA dist dias: -23 cm/s
Left CCA dist sys: -100 cm/s
Left CCA prox dias: 20 cm/s
Left CCA prox sys: 122 cm/s
Left ICA dist dias: -34 cm/s
Left ICA dist sys: -109 cm/s
Left ICA prox dias: -29 cm/s
Left ICA prox sys: -96 cm/s
RIGHT ECA DIAS: -28 cm/s
RIGHT VERTEBRAL DIAS: 12 cm/s
Right CCA prox dias: -14 cm/s
Right CCA prox sys: -81 cm/s
Right cca dist sys: -91 cm/s

## 2017-06-18 LAB — BASIC METABOLIC PANEL
Anion gap: 8 (ref 5–15)
BUN: 10 mg/dL (ref 6–20)
CHLORIDE: 105 mmol/L (ref 101–111)
CO2: 24 mmol/L (ref 22–32)
CREATININE: 0.77 mg/dL (ref 0.61–1.24)
Calcium: 9.2 mg/dL (ref 8.9–10.3)
GFR calc Af Amer: >60 mL/min (ref >60)
GFR calc non Af Amer: >60 mL/min (ref >60)
GLUCOSE: 102 mg/dL — AB (ref 65–99)
Potassium: 4.2 mmol/L (ref 3.5–5.1)
SODIUM: 137 mmol/L (ref 135–145)

## 2017-06-18 LAB — MRSA PCR SCREENING: MRSA by PCR: NEGATIVE

## 2017-06-18 LAB — HEPARIN LEVEL (UNFRACTIONATED)
HEPARIN UNFRACTIONATED: 0.28 [IU]/mL — AB (ref 0.30–0.70)
HEPARIN UNFRACTIONATED: 0.32 [IU]/mL (ref 0.30–0.70)

## 2017-06-18 LAB — HIV ANTIBODY (ROUTINE TESTING W REFLEX): HIV Screen 4th Generation wRfx: NONREACTIVE

## 2017-06-18 LAB — ABO/RH: ABO/RH(D): A POS

## 2017-06-18 MED ORDER — PROPOFOL 10 MG/ML IV BOLUS
INTRAVENOUS | Status: AC
  Filled 2017-06-18: qty 20

## 2017-06-18 MED ORDER — MIDAZOLAM HCL 10 MG/2ML IJ SOLN
INTRAMUSCULAR | Status: AC
  Filled 2017-06-18: qty 2

## 2017-06-18 MED ORDER — KENNESTONE BLOOD CARDIOPLEGIA (KBC) MANNITOL SYRINGE (20%, 32ML)
32.0000 mL | Freq: Once | INTRAVENOUS | Status: DC
  Filled 2017-06-18 (×4): qty 32

## 2017-06-18 MED ORDER — CHLORHEXIDINE GLUCONATE CLOTH 2 % EX PADS
6.0000 | MEDICATED_PAD | Freq: Four times a day (QID) | CUTANEOUS | Status: AC
  Administered 2017-06-18 – 2017-06-19 (×2): 6 via TOPICAL

## 2017-06-18 MED ORDER — KENNESTONE BLOOD CARDIOPLEGIA VIAL
13.0000 mL | Freq: Once | Status: DC
  Filled 2017-06-18 (×3): qty 13

## 2017-06-18 MED ORDER — ALBUTEROL SULFATE (2.5 MG/3ML) 0.083% IN NEBU
2.5000 mg | INHALATION_SOLUTION | Freq: Once | RESPIRATORY_TRACT | Status: AC
  Administered 2017-06-18: 2.5 mg via RESPIRATORY_TRACT

## 2017-06-18 MED ORDER — FENTANYL CITRATE (PF) 250 MCG/5ML IJ SOLN
INTRAMUSCULAR | Status: AC
  Filled 2017-06-18: qty 25

## 2017-06-18 MED ORDER — SODIUM CHLORIDE 0.9 % IV SOLN: INTRAVENOUS | Status: DC

## 2017-06-18 NOTE — Care Management Note (Signed)
Case Management Note  Patient Details  Name: Curtis Sherman MRN: [id number] Date of Birth: 1956-06-24  Subjective/Objective:   From home with spouse, presents with NSTEMI, cath shows 3 vessel dz, plan for CABG tomorrow.                  Action/Plan: NCM will follow for dc needs.   Expected Discharge Date:                  Expected Discharge Plan:     In-House Referral:     Discharge planning Services  CM Consult  Post Acute Care Choice:    Choice offered to:     DME Arranged:    DME Agency:     HH Arranged:    HH Agency:     Status of Service:  In process, will continue to follow  If discussed at Long Length of Stay Meetings, dates discussed:    Additional Comments:  Leone Havenaylor, Angelea Penny Clinton, RN 06/18/2017, 3:44 PM

## 2017-06-18 NOTE — Progress Notes (Signed)
ANTICOAGULATION CONSULT NOTE - Follow Up Consult  Pharmacy Consult for Heparin  Indication: Three vessel CAD awaiting CABG  Allergies  Allergen Reactions  . Codeine Nausea Only    Patient Measurements: Height: 6\' 1"  (185.4 cm) Weight: 183 lb 10.3 oz (83.3 kg) IBW/kg (Calculated) : 79.9 Vital Signs: Temp: 98.2 F (36.8 C) (12/20 1236) Temp Source: Oral (12/20 1236) BP: 124/63 (12/20 0800) Pulse Rate: 58 (12/20 0800)  Labs: Recent Labs    06/17/17 1718 06/17/17 2214 06/18/17 0530 06/18/17 0532 06/18/17 1414  HGB 16.8  --  16.4  --   --   HCT 50.0  --  47.6  --   --   PLT 117*  --  116*  --   --   APTT 28 45*  --   --   --   LABPROT 13.2 14.3  --   --   --   INR 1.01 1.12  --   --   --   HEPARINUNFRC  --   --   --  0.28* 0.32  CREATININE 0.72 0.71 0.77  --   --   TROPONINI 1.13*  --   --   --   --     Estimated Creatinine Clearance: 108.2 mL/min (by C-G formula based on SCr of 0.77 mg/dL).   Medical History: Past Medical History:  Diagnosis Date  . GERD (gastroesophageal reflux disease)   . High cholesterol     Assessment: 61 y/o M transfer from Short s/p cath on heparin at 1100 units/hr, awaiting CABG. Surgery was moved to 12/21 AM at 0815. First level had come back close to goal at 0.28, with slight rate increase. Repeat level came back therapeutic at 0.32, on 1250 units/hr. CBC remains stable. No signs/symptoms of bleeding. No issues with heparin infusion per nursing.  Goal of Therapy:  Heparin level 0.3-0.7 units/ml Monitor platelets by anticoagulation protocol: Yes   Plan:  Continue heparin at 1250 units/hr Monitor daily heparin level and CBC Monitor signs/symptoms of bleeding Follow up after CABG tomorrow   Girard CooterKimberly Perkins, PharmD Clinical Pharmacist  Pager: [phone] Clinical Phone for 06/18/2017 until 3:30pm: [phone] If after 3:30pm, please call main pharmacy at [phone] 06/18/2017,3:07 PM

## 2017-06-18 NOTE — Progress Notes (Signed)
Pre-op Cardiac Surgery  Carotid Findings:  1-39% ICA stenosis.  Vertebral artery flow is antegrade.  Upper Extremity Right Left  Brachial Pressures 149T 140T  Radial Waveforms T T  Ulnar Waveforms T T  Palmar Arch (Allen's Test) Doppler signal remains normal with radial compression and obliterates with ulnar compression Doppler signal remains normal with radial compression and obliterates with ulnar compression   Findings:      Lower  Extremity Right Left  Dorsalis Pedis 129T 144T  Anterior Tibial    Posterior Tibial 146T 148TT  Ankle/Brachial Indices      Findings:

## 2017-06-18 NOTE — Progress Notes (Signed)
Patient ID: Curtis Sherman, male   DOB: 11-12-1955, 61 y.o.   MRN: [id number] TCTS DAILY ICU PROGRESS NOTE                   [address]            [place] [id number]          [phone]    Total Length of Stay:  LOS: 1 day   Subjective: Patient stable , awake and neurologically intact.  He has had no chest pain since admission  Objective: Vital signs in last 24 hours: Temp:  [97.9 F (36.6 C)-98.6 F (37 C)] 98.1 F (36.7 C) (12/20 0300) Pulse Rate:  [51-71] 52 (12/20 0500) Cardiac Rhythm: Sinus bradycardia (12/20 0400) Resp:  [10-17] 16 (12/20 0500) BP: (108-154)/(62-84) 108/62 (12/20 0500) SpO2:  [94 %-99 %] 94 % (12/20 0500) Weight:  [183 lb 10.3 oz (83.3 kg)-185 lb (83.9 kg)] 183 lb 10.3 oz (83.3 kg) (12/19 2035)  Filed Weights   06/17/17 2035  Weight: 183 lb 10.3 oz (83.3 kg)    Weight change:    Hemodynamic parameters for last 24 hours:    Intake/Output from previous day: 12/19 0701 - 12/20 0700 In: 45.3 [I.V.:45.3] Out: 450 [Urine:450]  Intake/Output this shift: Total I/O In: 45.3 [I.V.:45.3] Out: 450 [Urine:450]  Current Meds: Scheduled Meds: . aspirin EC  81 mg Oral Daily  . bisacodyl  5 mg Oral Once  . chlorhexidine  15 mL Mouth/Throat Once  . Chlorhexidine Gluconate Cloth  6 each Topical Once  . heparin-papaverine-plasmalyte irrigation   Irrigation To OR  . magnesium sulfate  40 mEq Other To OR  . metoprolol tartrate  12.5 mg Oral Once  . potassium chloride  80 mEq Other To OR  . tranexamic acid  15 mg/kg Intravenous To OR  . tranexamic acid  2 mg/kg Intracatheter To OR   Continuous Infusions: . cefUROXime (ZINACEF)  IV    . cefUROXime (ZINACEF)  IV    . dexmedetomidine    . DOPamine    . epinephrine    . heparin 30,000 units/NS 1000 mL solution for CELLSAVER    . heparin 1,100 Units/hr (06/17/17 2353)  . insulin (NOVOLIN-R) infusion    . milrinone    . nitroGLYCERIN    . phenylephrine 20mg /22450mL NS (0.08mg /ml)  infusion    . tranexamic acid (CYKLOKAPRON) infusion ([facility])    . vancomycin     PRN Meds:.acetaminophen, nitroGLYCERIN, ondansetron (ZOFRAN) IV, temazepam  General appearance: alert and cooperative Neurologic: intact Heart: regular rate and rhythm, S1, S2 normal, no murmur, click, rub or gallop Lungs: clear to auscultation bilaterally Abdomen: soft, non-tender; bowel sounds normal; no masses,  no organomegaly Extremities: extremities normal, atraumatic, no cyanosis or edema Wound: Right groin stable  Lab Results: CBC: Recent Labs    06/17/17 1718 06/18/17 0530  WBC 11.2* 7.1  HGB 16.8 16.4  HCT 50.0 47.6  PLT 117* PENDING   BMET:  Recent Labs    06/17/17 2214 06/18/17 0530  NA 139 137  K 4.0 4.2  CL 108 105  CO2 24 24  GLUCOSE 95 102*  BUN 8 10  CREATININE 0.71 0.77  CALCIUM 9.3 9.2    CMET: Lab Results  Component Value Date   WBC 7.1 06/18/2017   HGB 16.4 06/18/2017   HCT 47.6 06/18/2017   PLT PENDING 06/18/2017   GLUCOSE 102 (H) 06/18/2017   CHOL 173 06/17/2017   TRIG 85  06/17/2017   HDL 47 06/17/2017   LDLCALC 109 (H) 06/17/2017   ALT 37 06/17/2017   AST 37 06/17/2017   NA 137 06/18/2017   K 4.2 06/18/2017   CL 105 06/18/2017   CREATININE 0.77 06/18/2017   BUN 10 06/18/2017   CO2 24 06/18/2017   INR 1.12 06/17/2017   HGBA1C 6.1 (H) 06/17/2017      PT/INR:  Recent Labs    06/17/17 2214  LABPROT 14.3  INR 1.12   Radiology: Dg Chest Port 1 View  Result Date: 06/17/2017 CLINICAL DATA:  Chest pain across chest and down LEFT arm, began without exertion EXAM: PORTABLE CHEST 1 VIEW COMPARISON:  Portable exam 1820 hours compared to 03/06/2013 FINDINGS: External pacing leads project over chest. Upper normal size of cardiac silhouette. Mediastinal contours normal. Peribronchial thickening with questionable LEFT perihilar infiltrate versus edema. Remaining lungs grossly clear. No pleural effusion or pneumothorax. Bones demineralized. IMPRESSION:  Peribronchial thickening with LEFT perihilar opacity which could represent pneumonia or asymmetric edema. Electronically Signed   By: Ulyses SouthwardMark  Boles M.D.   On: 06/17/2017 17:37    Lab Results  Component Value Date   TROPONINI 1.13 (HH) 06/17/2017    Assessment/Plan: Patient was scheduled initially for coronary artery bypass grafting this morning, due to another critical patient with acute aortic dissection patient's surgery has been delayed from first thing this morning.  Potentially will proceed with coronary artery bypass grafting tomorrow morning or possibly sooner depending on the availability of room and surgeon.    Delight Ovensdward B Mervyn Pflaum 06/18/2017 6:38 AM

## 2017-06-18 NOTE — Progress Notes (Signed)
Discussed sternal precautions, IS (able to inspire 2500 mL with correct mechanics), mobility post op and d/c planning. His wife works so encouraged pt to talk with family about when he goes home. Gave [facility] booklet and careguide, video system is not working right now.  [id number] Ethelda ChickKristan Cloe Sockwell CES, ACSM 1:48 PM 06/18/2017

## 2017-06-18 NOTE — H&P (Signed)
Cardiology Admission History and Physical:   Patient ID: Curtis Sherman; MRN: [id number]; DOB: 01-10-1956   Admission date: 06/17/2017  Primary Care Provider: Marina Goodell, MD Primary Cardiologist:  Primary Electrophysiologist:    Chief Complaint:  STEMI, S/P CATH  3V disease, transferred for CAB G 12/20 AM    Patient Profile:   Curtis Sherman is a 61 y.o. male with a history of 3vd  , CAD,  S/P EMERGENT  CATH AT  [facility]    in cardiac intensive care unit [facility] approximately 8:45 pm .    On presentation he appears comfortable and notes the chest pain that he previously had had resolved.      History of Present Illness:   Curtis Sherman had a STEMI at [facility]   The patient reports his usual health until 1 hour prior to presenting to [facility] emergency room with substernal chest pain, radiation to his left arm, with tingling in his right arm. At one point chest pain was rated 10 out of 10.   The patient was brought to [facility] emergency room via EMS, treated with nitroglycerin spray, with some relief of chest pain. ECG reveals sinus rhythm, peaked T waves in leads V2 and V3 with reciprocal ST depressions in the inferior and lateral leads. Cardiac cath done. Patient was chest pain free , no acute intervention done on RCA and patient transferred to [facility] to consider CABG. CURRENTLY ON HEP GTT, NO CHEST PAIN, Underwent Echo which shows EF 60%, NO SIGNIFICANT VALCE REGURGITATION OR STENOSIS.( PRELIM READ/PERSONAL).   Past Medical History:  Diagnosis Date  . GERD (gastroesophageal reflux disease)   . High cholesterol     Past Surgical History:  Procedure Laterality Date  . APPENDECTOMY    . BACK SURGERY    . ESOPHAGOGASTRODUODENOSCOPY (EGD) WITH PROPOFOL N/A 12/25/2014   Procedure: ESOPHAGOGASTRODUODENOSCOPY (EGD) WITH PROPOFOL;  Surgeon: Scot Jun, MD;  Location: [facility] ENDOSCOPY;  Service: Endoscopy;  Laterality: N/A;  . FRACTURE SURGERY    . HERNIA REPAIR    . NISSEN  FUNDOPLICATION       Medications Prior to Admission: Prior to Admission medications   Medication Sig Start Date End Date Taking? Authorizing Provider  ezetimibe-simvastatin (VYTORIN) 10-40 MG per tablet Take 1 tablet by mouth daily.   Yes [provider]  pantoprazole (PROTONIX) 40 MG tablet Take 40 mg by mouth daily.   Yes [provider]  cyclobenzaprine (FLEXERIL) 5 MG tablet Take 1 tablet (5 mg total) by mouth 3 (three) times daily as needed for muscle spasms. Patient not taking: Reported on 06/17/2017 06/02/17   Menshew, Charlesetta Ivory, PA-C  ketorolac (TORADOL) 10 MG tablet Take 1 tablet (10 mg total) by mouth every 8 (eight) hours. Patient not taking: Reported on 06/17/2017 11/04/16   Menshew, Charlesetta Ivory, PA-C     Allergies:    Allergies  Allergen Reactions  . Codeine Nausea Only    Social History:   Social History   Socioeconomic History  . Marital status: Married    Spouse name: Not on file  . Number of children: Not on file  . Years of education: Not on file  . Highest education level: Not on file  Social Needs  . Financial resource strain: Not on file  . Food insecurity - worry: Not on file  . Food insecurity - inability: Not on file  . Transportation needs - medical: Not on file  . Transportation needs - non-medical: Not on file  Occupational History  . Not on file  Tobacco Use  . Smoking status: Current Every Day Smoker    Types: Cigars  . Smokeless tobacco: Never Used  Substance and Sexual Activity  . Alcohol use: No  . Drug use: No  . Sexual activity: Not on file  Other Topics Concern  . Not on file  Social History Narrative  . Not on file    Family History:   The patient's family history is not on file.    ROS:  Please see the history of present illness.  All other ROS reviewed and negative.    POSITIVE FOR CHEST PAIN  EARLIER NO  CHEST PAIN NOW, RESTING  COMFORTABLY  Physical Exam/Data:   Vitals:   06/17/17 2035  06/17/17 2300  BP: (!) 154/77   Pulse: 63   Resp: 17   Temp: 98.6 F (37 C) 97.9 F (36.6 C)  TempSrc: Oral Oral  SpO2: 95%   Weight: 183 lb 10.3 oz (83.3 kg)   Height: 6\' 1"  (1.854 m)     Intake/Output Summary (Last 24 hours) at 06/18/2017 0054 Last data filed at 06/17/2017 2035 Gross per 24 hour  Intake -  Output 450 ml  Net -450 ml   Filed Weights   06/17/17 2035  Weight: 183 lb 10.3 oz (83.3 kg)   Body mass index is 24.23 kg/m.   Review of Systems:   Review of Systems  Constitutional: Negative for chills, diaphoresis, fever, malaise/fatigue and weight loss.  HENT: Negative.   Eyes: Negative for blurred vision, double vision, discharge and redness.  Respiratory: Negative for cough, hemoptysis, sputum production, shortness of breath and wheezing.   Cardiovascular: Positive for chest pain. Negative for palpitations, orthopnea, claudication, leg swelling and PND.  Gastrointestinal: Positive for heartburn. Negative for abdominal pain, blood in stool, constipation, diarrhea, melena, nausea and vomiting.  Genitourinary: Negative for dysuria, frequency and urgency.  Musculoskeletal: Positive for neck pain. Negative for back pain, falls and joint pain.  Skin: Negative.   Neurological: Negative for dizziness, tingling, tremors, sensory change, speech change, focal weakness, seizures, loss of consciousness and headaches.  Endo/Heme/Allergies: Negative for environmental allergies. Does not bruise/bleed easily.  Psychiatric/Behavioral: Negative.    Pertinent items are noted in HPI.    EKG:  The ECG that was done  was personally reviewed and demonstrates [facility] EKG/STEMI pattern , v2 v3  Relevant CV Studies: 12/19 CATH AT [facility]   Coronary/Graft Acute MI Revascularization  LEFT HEART CATH AND CORONARY ANGIOGRAPHY  Conclusion     Dist RCA lesion is 95% stenosed.  Prox RCA lesion is 60% stenosed.  Prox RCA to Mid RCA lesion is 50% stenosed.  Mid RCA lesion is 50%  stenosed.  Ost 1st Mrg lesion is 100% stenosed.  Ost LAD to Prox LAD lesion is 75% stenosed.  Ost 1st Diag lesion is 80% stenosed.  1. Three-vessel coronary artery disease with eccentric diffuse 75% stenosis proximal mid LAD, 80% stenosis ostium D G1, occluded OM1, high-grade thrombotic 95% stenosis distal RCA, diffuse 50-75% stenosis proximal mid RCA. 2. Mild reduced left ventricular function, with LVEF 45-50%, with inferior apical hypokinesis  Recommendations  1. CABG    Laboratory Data:  Chemistry Recent Labs  Lab 06/17/17 1718 06/17/17 2214  NA 134* 139  K 3.9 4.0  CL 107 108  CO2 24 24  GLUCOSE 122* 95  BUN 12 8  CREATININE 0.72 0.71  CALCIUM 9.3 9.3  GFRNONAA >60 >60  GFRAA >60 >60  ANIONGAP  3* 7    Recent Labs  Lab 06/17/17 1718 06/17/17 2214  PROT 6.6 5.9*  ALBUMIN 4.0 3.5  AST 39 37  ALT 38 37  ALKPHOS 88 87  BILITOT 0.9 1.0   Hematology Recent Labs  Lab 06/17/17 1718  WBC 11.2*  RBC 5.44  HGB 16.8  HCT 50.0  MCV 92.0  MCH 30.8  MCHC 33.5  RDW 13.4  PLT 117*   Cardiac Enzymes Recent Labs  Lab 06/17/17 1718  TROPONINI 1.13*   No results for input(s): TROPIPOC in the last 168 hours.  BNPNo results for input(s): BNP, PROBNP in the last 168 hours.  DDimer No results for input(s): DDIMER in the last 168 hours.  Radiology/Studies:  Dg Chest Port 1 View  Result Date: 06/17/2017 CLINICAL DATA:  Chest pain across chest and down LEFT arm, began without exertion EXAM: PORTABLE CHEST 1 VIEW COMPARISON:  Portable exam 1820 hours compared to 03/06/2013 FINDINGS: External pacing leads project over chest. Upper normal size of cardiac silhouette. Mediastinal contours normal. Peribronchial thickening with questionable LEFT perihilar infiltrate versus edema. Remaining lungs grossly clear. No pleural effusion or pneumothorax. Bones demineralized. IMPRESSION: Peribronchial thickening with LEFT perihilar opacity which could represent pneumonia or  asymmetric edema. Electronically Signed   By: Ulyses SouthwardMark  Boles M.D.   On: 06/17/2017 17:37    Assessment and Plan:   1/ Three vessel coronary disease S/P EMERGENT LHC   Dist RCA lesion is 95% stenosed.  Prox RCA lesion is 60% stenosed.  Prox RCA to Mid RCA lesion is 50% stenosed.  Mid RCA lesion is 50% stenosed.  Ost 1st Mrg lesion is 100% stenosed.  Ost LAD to Prox LAD lesion is 75% stenosed.  Ost 1st Diag lesion is 80% stenosed.  1. Three-vessel coronary artery disease with eccentric diffuse 75% stenosis proximal mid LAD, 80% stenosis ostium D G1, occluded OM1, high-grade thrombotic 95% stenosis distal RCA, diffuse 50-75% stenosis proximal mid RCA. 2. Mild reduced left ventricular function, with LVEF 45-50%, with inferior apical CABG planned 12/20 AM  Continue hep gtt EF 55-60%,No significant valve regurgitation or stenosis,PRELIM  2. Dyslipidemia Continue high intensity statin  3. HTN  WELL CONTROLLED, stable       Severity of Illness: The appropriate patient status for this patient is INPATIENT. Inpatient status is judged to be reasonable and necessary in order to provide the required intensity of service to ensure the patient's safety. The patient's presenting symptoms, physical exam findings, and initial radiographic and laboratory data in the context of their chronic comorbidities is felt to place them at high risk for further clinical deterioration. Furthermore, it is not anticipated that the patient will be medically stable for discharge from the hospital within 2 midnights of admission. The following factors support the patient status of inpatient.   " The patient's presenting symptoms include chest pain, dyspnea. " The worrisome physical exam findings include Difficutly in breathing, abnl  Ekg, decreased  Lung  Sounds, rhonchi " The initial radiographic and laboratory data are worrisome because of  ABNL  EKG, V3 V3 , STEMI,  3 VESSEL DISEASE ON CATH  " The  chronic co-morbidities include  AGE, dyslipidemia, family history    * I certify that at the point of admission it is my clinical judgment that the patient will require inpatient hospital care spanning beyond 2 midnights from the point of admission due to high intensity of service, high risk for further deterioration and high frequency of surveillance required.*  For questions or updates, please contact [facility] Please consult [email or web address] for contact info under Cardiology/STEMI.    Signed, Lynwood DawleyAmeeth Kellan Boehlke, MD  06/18/2017 12:54 AM

## 2017-06-19 ENCOUNTER — Inpatient Hospital Stay (HOSPITAL_COMMUNITY): Payer: Medicare HMO

## 2017-06-19 ENCOUNTER — Inpatient Hospital Stay (HOSPITAL_COMMUNITY): Payer: Medicare HMO | Admitting: Anesthesiology

## 2017-06-19 ENCOUNTER — Inpatient Hospital Stay (HOSPITAL_COMMUNITY)
Admission: AD | Disposition: A | Discharge status: Home / Self Care | Payer: Self-pay | Source: Other Acute Inpatient Hospital | Attending: Cardiothoracic Surgery

## 2017-06-19 DIAGNOSIS — Z951 Presence of aortocoronary bypass graft: Secondary | ICD-10-CM

## 2017-06-19 HISTORY — DX: Presence of aortocoronary bypass graft: Z95.1

## 2017-06-19 HISTORY — PX: CORONARY ARTERY BYPASS GRAFT: SHX141

## 2017-06-19 LAB — POCT I-STAT, CHEM 8
BUN: 11 mg/dL (ref 6–20)
BUN: 11 mg/dL (ref 6–20)
BUN: 9 mg/dL (ref 6–20)
BUN: 10 mg/dL (ref 6–20)
BUN: 10 mg/dL (ref 6–20)
BUN: 12 mg/dL (ref 6–20)
CALCIUM ION: 1.13 mmol/L — AB (ref 1.15–1.40)
CHLORIDE: 106 mmol/L (ref 101–111)
CHLORIDE: 103 mmol/L (ref 101–111)
CREATININE: 0.5 mg/dL — AB (ref 0.61–1.24)
CREATININE: 0.5 mg/dL — AB (ref 0.61–1.24)
CREATININE: 0.5 mg/dL — AB (ref 0.61–1.24)
Calcium, Ion: 1.3 mmol/L (ref 1.15–1.40)
Calcium, Ion: 1.1 mmol/L — ABNORMAL LOW (ref 1.15–1.40)
Calcium, Ion: 1.24 mmol/L (ref 1.15–1.40)
Calcium, Ion: 1.17 mmol/L (ref 1.15–1.40)
Calcium, Ion: 1.19 mmol/L (ref 1.15–1.40)
Chloride: 105 mmol/L (ref 101–111)
Chloride: 100 mmol/L — ABNORMAL LOW (ref 101–111)
Chloride: 105 mmol/L (ref 101–111)
Chloride: 103 mmol/L (ref 101–111)
Creatinine, Ser: 0.4 mg/dL — ABNORMAL LOW (ref 0.61–1.24)
Creatinine, Ser: 0.5 mg/dL — ABNORMAL LOW (ref 0.61–1.24)
Creatinine, Ser: 0.6 mg/dL — ABNORMAL LOW (ref 0.61–1.24)
GLUCOSE: 148 mg/dL — AB (ref 65–99)
Glucose, Bld: 103 mg/dL — ABNORMAL HIGH (ref 65–99)
Glucose, Bld: 103 mg/dL — ABNORMAL HIGH (ref 65–99)
Glucose, Bld: 91 mg/dL (ref 65–99)
Glucose, Bld: 102 mg/dL — ABNORMAL HIGH (ref 65–99)
Glucose, Bld: 109 mg/dL — ABNORMAL HIGH (ref 65–99)
HCT: 42 % (ref 39.0–52.0)
HEMATOCRIT: 43 % (ref 39.0–52.0)
HEMATOCRIT: 34 % — AB (ref 39.0–52.0)
HEMATOCRIT: 43 % (ref 39.0–52.0)
HEMATOCRIT: 35 % — AB (ref 39.0–52.0)
HEMATOCRIT: 35 % — AB (ref 39.0–52.0)
HEMOGLOBIN: 14.6 g/dL (ref 13.0–17.0)
HEMOGLOBIN: 11.6 g/dL — AB (ref 13.0–17.0)
HEMOGLOBIN: 14.6 g/dL (ref 13.0–17.0)
HEMOGLOBIN: 11.9 g/dL — AB (ref 13.0–17.0)
Hemoglobin: 11.9 g/dL — ABNORMAL LOW (ref 13.0–17.0)
Hemoglobin: 14.3 g/dL (ref 13.0–17.0)
POTASSIUM: 4.6 mmol/L (ref 3.5–5.1)
POTASSIUM: 4.6 mmol/L (ref 3.5–5.1)
Potassium: 4.5 mmol/L (ref 3.5–5.1)
Potassium: 4.7 mmol/L (ref 3.5–5.1)
Potassium: 4 mmol/L (ref 3.5–5.1)
Potassium: 4.9 mmol/L (ref 3.5–5.1)
SODIUM: 138 mmol/L (ref 135–145)
SODIUM: 139 mmol/L (ref 135–145)
SODIUM: 141 mmol/L (ref 135–145)
Sodium: 139 mmol/L (ref 135–145)
Sodium: 138 mmol/L (ref 135–145)
Sodium: 139 mmol/L (ref 135–145)
TCO2: 28 mmol/L (ref 22–32)
TCO2: 27 mmol/L (ref 22–32)
TCO2: 28 mmol/L (ref 22–32)
TCO2: 26 mmol/L (ref 22–32)
TCO2: 27 mmol/L (ref 22–32)
TCO2: 24 mmol/L (ref 22–32)

## 2017-06-19 LAB — POCT I-STAT 3, ART BLOOD GAS (G3+)
ACID-BASE DEFICIT: 1 mmol/L (ref 0.0–2.0)
ACID-BASE DEFICIT: 6 mmol/L — AB (ref 0.0–2.0)
ACID-BASE DEFICIT: 6 mmol/L — AB (ref 0.0–2.0)
ACID-BASE DEFICIT: 4 mmol/L — AB (ref 0.0–2.0)
ACID-BASE EXCESS: 2 mmol/L (ref 0.0–2.0)
Acid-Base Excess: 2 mmol/L (ref 0.0–2.0)
Acid-base deficit: 5 mmol/L — ABNORMAL HIGH (ref 0.0–2.0)
BICARBONATE: 28 mmol/L (ref 20.0–28.0)
BICARBONATE: 25.3 mmol/L (ref 20.0–28.0)
BICARBONATE: 22.1 mmol/L (ref 20.0–28.0)
BICARBONATE: 21.1 mmol/L (ref 20.0–28.0)
Bicarbonate: 26.3 mmol/L (ref 20.0–28.0)
Bicarbonate: 24.5 mmol/L (ref 20.0–28.0)
Bicarbonate: 20.1 mmol/L (ref 20.0–28.0)
Bicarbonate: 22.3 mmol/L (ref 20.0–28.0)
O2 SAT: 100 %
O2 SAT: 98 %
O2 SAT: 97 %
O2 SAT: 97 %
O2 SAT: 97 %
O2 Saturation: 100 %
O2 Saturation: 100 %
O2 Saturation: 96 %
PCO2 ART: 34.2 mmHg (ref 32.0–48.0)
PCO2 ART: 46.2 mmHg (ref 32.0–48.0)
PCO2 ART: 42.2 mmHg (ref 32.0–48.0)
PCO2 ART: 44.7 mmHg (ref 32.0–48.0)
PH ART: 7.37 (ref 7.350–7.450)
PH ART: 7.307 — AB (ref 7.350–7.450)
PO2 ART: 317 mmHg — AB (ref 83.0–108.0)
PO2 ART: 478 mmHg — AB (ref 83.0–108.0)
PO2 ART: 391 mmHg — AB (ref 83.0–108.0)
PO2 ART: 110 mmHg — AB (ref 83.0–108.0)
PO2 ART: 95 mmHg (ref 83.0–108.0)
PO2 ART: 89 mmHg (ref 83.0–108.0)
Patient temperature: 35.9
Patient temperature: 36.5
Patient temperature: 37.1
TCO2: 28 mmol/L (ref 22–32)
TCO2: 29 mmol/L (ref 22–32)
TCO2: 26 mmol/L (ref 22–32)
TCO2: 26 mmol/L (ref 22–32)
TCO2: 24 mmol/L (ref 22–32)
TCO2: 21 mmol/L — AB (ref 22–32)
TCO2: 23 mmol/L (ref 22–32)
TCO2: 24 mmol/L (ref 22–32)
pCO2 arterial: 51.4 mmHg — ABNORMAL HIGH (ref 32.0–48.0)
pCO2 arterial: 48.5 mmHg — ABNORMAL HIGH (ref 32.0–48.0)
pCO2 arterial: 40.2 mmHg (ref 32.0–48.0)
pCO2 arterial: 46.5 mmHg (ref 32.0–48.0)
pH, Arterial: 7.318 — ABNORMAL LOW (ref 7.350–7.450)
pH, Arterial: 7.476 — ABNORMAL HIGH (ref 7.350–7.450)
pH, Arterial: 7.393 (ref 7.350–7.450)
pH, Arterial: 7.281 — ABNORMAL LOW (ref 7.350–7.450)
pH, Arterial: 7.263 — ABNORMAL LOW (ref 7.350–7.450)
pH, Arterial: 7.285 — ABNORMAL LOW (ref 7.350–7.450)
pO2, Arterial: 95 mmHg (ref 83.0–108.0)
pO2, Arterial: 104 mmHg (ref 83.0–108.0)

## 2017-06-19 LAB — CBC
HCT: 42.5 % (ref 39.0–52.0)
HCT: 42.4 % (ref 39.0–52.0)
Hemoglobin: 14.2 g/dL (ref 13.0–17.0)
Hemoglobin: 14.3 g/dL (ref 13.0–17.0)
MCH: 31.2 pg (ref 26.0–34.0)
MCH: 31.2 pg (ref 26.0–34.0)
MCHC: 33.4 g/dL (ref 30.0–36.0)
MCHC: 33.7 g/dL (ref 30.0–36.0)
MCV: 93.4 fL (ref 78.0–100.0)
MCV: 92.4 fL (ref 78.0–100.0)
PLATELETS: 73 10*3/uL — AB (ref 150–400)
Platelets: 82 10*3/uL — ABNORMAL LOW (ref 150–400)
RBC: 4.55 MIL/uL (ref 4.22–5.81)
RBC: 4.59 MIL/uL (ref 4.22–5.81)
RDW: 13.3 % (ref 11.5–15.5)
RDW: 13.2 % (ref 11.5–15.5)
WBC: 11 10*3/uL — ABNORMAL HIGH (ref 4.0–10.5)
WBC: 10.8 10*3/uL — ABNORMAL HIGH (ref 4.0–10.5)

## 2017-06-19 LAB — GLUCOSE, CAPILLARY
GLUCOSE-CAPILLARY: 116 mg/dL — AB (ref 65–99)
GLUCOSE-CAPILLARY: 106 mg/dL — AB (ref 65–99)
GLUCOSE-CAPILLARY: 123 mg/dL — AB (ref 65–99)
Glucose-Capillary: 92 mg/dL (ref 65–99)
Glucose-Capillary: 92 mg/dL (ref 65–99)
Glucose-Capillary: 138 mg/dL — ABNORMAL HIGH (ref 65–99)

## 2017-06-19 LAB — HEMOGLOBIN AND HEMATOCRIT, BLOOD
HCT: 38.1 % — ABNORMAL LOW (ref 39.0–52.0)
Hemoglobin: 12.7 g/dL — ABNORMAL LOW (ref 13.0–17.0)

## 2017-06-19 LAB — POCT I-STAT 4, (NA,K, GLUC, HGB,HCT)
Glucose, Bld: 97 mg/dL (ref 65–99)
HCT: 41 % (ref 39.0–52.0)
Hemoglobin: 13.9 g/dL (ref 13.0–17.0)
Potassium: 3.9 mmol/L (ref 3.5–5.1)
Sodium: 142 mmol/L (ref 135–145)

## 2017-06-19 LAB — CREATININE, SERUM
Creatinine, Ser: 0.77 mg/dL (ref 0.61–1.24)
GFR calc Af Amer: >60 mL/min (ref >60)
GFR calc non Af Amer: >60 mL/min (ref >60)

## 2017-06-19 LAB — MAGNESIUM: Magnesium: 2.4 mg/dL (ref 1.7–2.4)

## 2017-06-19 LAB — SURGICAL PCR SCREEN
MRSA, PCR: NEGATIVE
Staphylococcus aureus: NEGATIVE

## 2017-06-19 LAB — APTT: aPTT: 31 seconds (ref 24–36)

## 2017-06-19 LAB — PLATELET COUNT: Platelets: 84 10*3/uL — ABNORMAL LOW (ref 150–400)

## 2017-06-19 LAB — PROTIME-INR
INR: 1.36
PROTHROMBIN TIME: 16.6 s — AB (ref 11.4–15.2)

## 2017-06-19 SURGERY — CORONARY ARTERY BYPASS GRAFTING (CABG)
Anesthesia: General | Site: Chest

## 2017-06-19 MED ORDER — SODIUM CHLORIDE 0.9 % IV SOLN
0.0000 ug/min | INTRAVENOUS | Status: DC
  Filled 2017-06-19: qty 2

## 2017-06-19 MED ORDER — ASPIRIN EC 325 MG PO TBEC
325.0000 mg | DELAYED_RELEASE_TABLET | Freq: Every day | ORAL | Status: DC
  Administered 2017-06-20: 325 mg via ORAL
  Filled 2017-06-19: qty 1

## 2017-06-19 MED ORDER — PROTAMINE SULFATE 10 MG/ML IV SOLN
INTRAVENOUS | Status: DC | PRN
  Administered 2017-06-19 (×2): 50 mg via INTRAVENOUS
  Administered 2017-06-19: 40 mg via INTRAVENOUS
  Administered 2017-06-19 (×3): 50 mg via INTRAVENOUS

## 2017-06-19 MED ORDER — CEFUROXIME SODIUM 1.5 G IV SOLR
1.5000 g | INTRAVENOUS | Status: DC
  Filled 2017-06-19: qty 1.5

## 2017-06-19 MED ORDER — PLASMA-LYTE 148 IV SOLN
INTRAVENOUS | Status: AC
  Administered 2017-06-19: 500 mL
  Filled 2017-06-19: qty 2.5

## 2017-06-19 MED ORDER — PAPAVERINE HCL 30 MG/ML IJ SOLN
INTRAMUSCULAR | Status: AC
  Administered 2017-06-19: 13:00:00
  Filled 2017-06-19: qty 2.5

## 2017-06-19 MED ORDER — PHENYLEPHRINE HCL 10 MG/ML IJ SOLN
30.0000 ug/min | INTRAMUSCULAR | Status: DC
  Filled 2017-06-19: qty 2

## 2017-06-19 MED ORDER — SODIUM CHLORIDE 0.9 % IV SOLN
INTRAVENOUS | Status: DC | PRN
  Administered 2017-06-19: 1 [IU]/h via INTRAVENOUS

## 2017-06-19 MED ORDER — SODIUM CHLORIDE 0.9% FLUSH
3.0000 mL | Freq: Two times a day (BID) | INTRAVENOUS | Status: DC
  Administered 2017-06-20 – 2017-06-21 (×4): 3 mL via INTRAVENOUS

## 2017-06-19 MED ORDER — DEXMEDETOMIDINE HCL IN NACL 400 MCG/100ML IV SOLN
0.1000 ug/kg/h | INTRAVENOUS | Status: DC
  Filled 2017-06-19: qty 100

## 2017-06-19 MED ORDER — EPINEPHRINE PF 1 MG/ML IJ SOLN
0.0000 ug/min | INTRAMUSCULAR | Status: DC
Start: 2017-06-19 — End: 2017-06-19
  Filled 2017-06-19: qty 4

## 2017-06-19 MED ORDER — OXYCODONE HCL 5 MG PO TABS
5.0000 mg | ORAL_TABLET | ORAL | Status: DC | PRN
  Administered 2017-06-20 (×2): 5 mg via ORAL
  Filled 2017-06-19 (×2): qty 1

## 2017-06-19 MED ORDER — MIDAZOLAM HCL 2 MG/2ML IJ SOLN
2.0000 mg | INTRAMUSCULAR | Status: DC | PRN
  Administered 2017-06-19 (×2): 2 mg via INTRAVENOUS
  Filled 2017-06-19 (×2): qty 2

## 2017-06-19 MED ORDER — LACTATED RINGERS IV SOLN
INTRAVENOUS | Status: DC | PRN
  Administered 2017-06-19: 09:00:00 via INTRAVENOUS

## 2017-06-19 MED ORDER — PLASMA-LYTE 148 IV SOLN
INTRAVENOUS | Status: DC
  Filled 2017-06-19: qty 2.5

## 2017-06-19 MED ORDER — MORPHINE SULFATE (PF) 4 MG/ML IV SOLN: 1.0000 mg | INTRAVENOUS | Status: AC | PRN

## 2017-06-19 MED ORDER — PROTAMINE SULFATE 10 MG/ML IV SOLN
INTRAVENOUS | Status: AC
  Filled 2017-06-19: qty 25

## 2017-06-19 MED ORDER — HEPARIN SODIUM (PORCINE) 1000 UNIT/ML IJ SOLN
INTRAMUSCULAR | Status: DC | PRN
  Administered 2017-06-19: 29 mL via INTRAVENOUS

## 2017-06-19 MED ORDER — DEXTROSE 5 % IV SOLN
INTRAVENOUS | Status: DC | PRN
  Administered 2017-06-19: 750 mg via INTRAVENOUS

## 2017-06-19 MED ORDER — TRANEXAMIC ACID (OHS) BOLUS VIA INFUSION
15.0000 mg/kg | INTRAVENOUS | Status: AC
  Administered 2017-06-19: 1249.5 mg via INTRAVENOUS
  Filled 2017-06-19: qty 1250

## 2017-06-19 MED ORDER — FAMOTIDINE IN NACL 20-0.9 MG/50ML-% IV SOLN
20.0000 mg | Freq: Two times a day (BID) | INTRAVENOUS | Status: DC
  Administered 2017-06-19: 20 mg via INTRAVENOUS

## 2017-06-19 MED ORDER — NITROGLYCERIN IN D5W 200-5 MCG/ML-% IV SOLN: 0.0000 ug/min | INTRAVENOUS | Status: DC

## 2017-06-19 MED ORDER — TRAMADOL HCL 50 MG PO TABS
50.0000 mg | ORAL_TABLET | ORAL | Status: DC | PRN
  Administered 2017-06-21: 100 mg via ORAL
  Administered 2017-06-21: 50 mg via ORAL
  Administered 2017-06-22: 100 mg via ORAL
  Filled 2017-06-19: qty 1
  Filled 2017-06-19 (×2): qty 2

## 2017-06-19 MED ORDER — VANCOMYCIN HCL IN DEXTROSE 1-5 GM/200ML-% IV SOLN
1000.0000 mg | Freq: Once | INTRAVENOUS | Status: AC
  Administered 2017-06-19: 1000 mg via INTRAVENOUS
  Filled 2017-06-19: qty 200

## 2017-06-19 MED ORDER — ALBUMIN HUMAN 5 % IV SOLN
INTRAVENOUS | Status: DC | PRN
  Administered 2017-06-19: 15:00:00 via INTRAVENOUS

## 2017-06-19 MED ORDER — ACETAMINOPHEN 500 MG PO TABS
1000.0000 mg | ORAL_TABLET | Freq: Four times a day (QID) | ORAL | Status: DC
  Administered 2017-06-20 – 2017-06-22 (×9): 1000 mg via ORAL
  Filled 2017-06-19 (×9): qty 2

## 2017-06-19 MED ORDER — MIDAZOLAM HCL 10 MG/2ML IJ SOLN
INTRAMUSCULAR | Status: AC
  Filled 2017-06-19: qty 2

## 2017-06-19 MED ORDER — MORPHINE SULFATE (PF) 4 MG/ML IV SOLN
2.0000 mg | INTRAVENOUS | Status: DC | PRN
  Administered 2017-06-20 (×2): 2 mg via INTRAVENOUS
  Filled 2017-06-19 (×2): qty 1

## 2017-06-19 MED ORDER — MIDAZOLAM HCL 5 MG/5ML IJ SOLN
INTRAMUSCULAR | Status: DC | PRN
  Administered 2017-06-19: 2 mg via INTRAVENOUS
  Administered 2017-06-19: 3 mg via INTRAVENOUS
  Administered 2017-06-19: 1 mg via INTRAVENOUS
  Administered 2017-06-19 (×2): 2 mg via INTRAVENOUS

## 2017-06-19 MED ORDER — MORPHINE SULFATE (PF) 2 MG/ML IV SOLN: 2.0000 mg | INTRAVENOUS | Status: DC | PRN

## 2017-06-19 MED ORDER — FENTANYL CITRATE (PF) 250 MCG/5ML IJ SOLN
INTRAMUSCULAR | Status: AC
  Filled 2017-06-19: qty 25

## 2017-06-19 MED ORDER — KENNESTONE BLOOD CARDIOPLEGIA VIAL
13.0000 mL | Freq: Once | Status: DC
  Filled 2017-06-19: qty 13

## 2017-06-19 MED ORDER — DOPAMINE-DEXTROSE 3.2-5 MG/ML-% IV SOLN
0.0000 ug/kg/min | INTRAVENOUS | Status: DC
  Filled 2017-06-19: qty 250

## 2017-06-19 MED ORDER — ONDANSETRON HCL 4 MG/2ML IJ SOLN
4.0000 mg | Freq: Four times a day (QID) | INTRAMUSCULAR | Status: DC | PRN
  Administered 2017-06-19 – 2017-06-21 (×4): 4 mg via INTRAVENOUS
  Filled 2017-06-19 (×4): qty 2

## 2017-06-19 MED ORDER — SODIUM CHLORIDE 0.9 % IV SOLN: INTRAVENOUS | Status: DC

## 2017-06-19 MED ORDER — 0.9 % SODIUM CHLORIDE (POUR BTL) OPTIME
TOPICAL | Status: DC | PRN
  Administered 2017-06-19: 6000 mL

## 2017-06-19 MED ORDER — SODIUM CHLORIDE 0.45 % IV SOLN
INTRAVENOUS | Status: DC | PRN
  Administered 2017-06-19: 20 mL/h via INTRAVENOUS

## 2017-06-19 MED ORDER — PROPOFOL 10 MG/ML IV BOLUS
INTRAVENOUS | Status: DC | PRN
  Administered 2017-06-19: 50 mg via INTRAVENOUS
  Administered 2017-06-19 (×2): 100 mg via INTRAVENOUS

## 2017-06-19 MED ORDER — INSULIN REGULAR BOLUS VIA INFUSION
0.0000 [IU] | Freq: Three times a day (TID) | INTRAVENOUS | Status: DC
  Filled 2017-06-19: qty 10

## 2017-06-19 MED ORDER — SODIUM CHLORIDE 0.9 % IV SOLN
0.0000 ug/kg/h | INTRAVENOUS | Status: DC
  Administered 2017-06-19: 0.7 ug/kg/h via INTRAVENOUS
  Filled 2017-06-19: qty 2

## 2017-06-19 MED ORDER — DEXTROSE 5 % IV SOLN
1.5000 g | Freq: Two times a day (BID) | INTRAVENOUS | Status: AC
  Administered 2017-06-19 – 2017-06-21 (×4): 1.5 g via INTRAVENOUS
  Filled 2017-06-19 (×4): qty 1.5

## 2017-06-19 MED ORDER — SODIUM CHLORIDE 0.9 % IV SOLN
INTRAVENOUS | Status: DC
  Filled 2017-06-19: qty 30

## 2017-06-19 MED ORDER — DEXTROSE 5 % IV SOLN
INTRAVENOUS | Status: DC | PRN
  Administered 2017-06-19: 1.5 g via INTRAVENOUS

## 2017-06-19 MED ORDER — HEMOSTATIC AGENTS (NO CHARGE) OPTIME
TOPICAL | Status: DC | PRN
  Administered 2017-06-19 (×2): 1 via TOPICAL

## 2017-06-19 MED ORDER — METOPROLOL TARTRATE 25 MG/10 ML ORAL SUSPENSION: 12.5000 mg | Freq: Two times a day (BID) | ORAL | Status: DC

## 2017-06-19 MED ORDER — INSULIN ASPART 100 UNIT/ML ~~LOC~~ SOLN
0.0000 [IU] | SUBCUTANEOUS | Status: DC
  Administered 2017-06-19 – 2017-06-20 (×3): 2 [IU] via SUBCUTANEOUS

## 2017-06-19 MED ORDER — FENTANYL CITRATE (PF) 250 MCG/5ML IJ SOLN
INTRAMUSCULAR | Status: DC | PRN
  Administered 2017-06-19: 100 ug via INTRAVENOUS
  Administered 2017-06-19: 250 ug via INTRAVENOUS
  Administered 2017-06-19 (×3): 50 ug via INTRAVENOUS
  Administered 2017-06-19 (×2): 250 ug via INTRAVENOUS
  Administered 2017-06-19: 150 ug via INTRAVENOUS
  Administered 2017-06-19: 50 ug via INTRAVENOUS
  Administered 2017-06-19 (×2): 250 ug via INTRAVENOUS
  Administered 2017-06-19: 50 ug via INTRAVENOUS

## 2017-06-19 MED ORDER — TRANEXAMIC ACID (OHS) PUMP PRIME SOLUTION
2.0000 mg/kg | INTRAVENOUS | Status: DC
  Filled 2017-06-19: qty 1.67

## 2017-06-19 MED ORDER — MORPHINE SULFATE (PF) 2 MG/ML IV SOLN: 1.0000 mg | INTRAVENOUS | Status: DC | PRN

## 2017-06-19 MED ORDER — MAGNESIUM SULFATE 4 GM/100ML IV SOLN
4.0000 g | Freq: Once | INTRAVENOUS | Status: AC
  Administered 2017-06-19: 4 g via INTRAVENOUS
  Filled 2017-06-19: qty 100

## 2017-06-19 MED ORDER — ACETAMINOPHEN 160 MG/5ML PO SOLN: 1000.0000 mg | Freq: Four times a day (QID) | ORAL | Status: DC

## 2017-06-19 MED ORDER — PROPOFOL 10 MG/ML IV BOLUS
INTRAVENOUS | Status: AC
  Filled 2017-06-19: qty 20

## 2017-06-19 MED ORDER — PROTAMINE SULFATE 10 MG/ML IV SOLN
INTRAVENOUS | Status: AC
  Filled 2017-06-19: qty 5

## 2017-06-19 MED ORDER — TRANEXAMIC ACID 1000 MG/10ML IV SOLN
INTRAVENOUS | Status: DC | PRN
  Administered 2017-06-19: 1.5 mg/kg/h via INTRAVENOUS

## 2017-06-19 MED ORDER — LACTATED RINGERS IV SOLN
INTRAVENOUS | Status: DC
  Administered 2017-06-20: 09:00:00 via INTRAVENOUS

## 2017-06-19 MED ORDER — LACTATED RINGERS IV SOLN: INTRAVENOUS | Status: DC

## 2017-06-19 MED ORDER — ASPIRIN 81 MG PO CHEW
324.0000 mg | CHEWABLE_TABLET | Freq: Every day | ORAL | Status: DC
  Administered 2017-06-21: 324 mg
  Filled 2017-06-19: qty 4

## 2017-06-19 MED ORDER — KENNESTONE BLOOD CARDIOPLEGIA (KBC) MANNITOL SYRINGE (20%, 32ML)
32.0000 mL | Freq: Once | INTRAVENOUS | Status: DC
  Filled 2017-06-19: qty 32

## 2017-06-19 MED ORDER — SODIUM CHLORIDE 0.9% FLUSH: 3.0000 mL | INTRAVENOUS | Status: DC | PRN

## 2017-06-19 MED ORDER — SODIUM CHLORIDE 0.9 % IV SOLN
INTRAVENOUS | Status: DC
  Filled 2017-06-19: qty 1

## 2017-06-19 MED ORDER — ACETAMINOPHEN 160 MG/5ML PO SOLN: 650.0000 mg | Freq: Once | ORAL | Status: AC

## 2017-06-19 MED ORDER — CHLORHEXIDINE GLUCONATE 0.12 % MT SOLN
15.0000 mL | OROMUCOSAL | Status: AC
  Administered 2017-06-19: 15 mL via OROMUCOSAL

## 2017-06-19 MED ORDER — SODIUM CHLORIDE 0.9 % IV SOLN: 250.0000 mL | INTRAVENOUS | Status: DC

## 2017-06-19 MED ORDER — LACTATED RINGERS IV SOLN
INTRAVENOUS | Status: DC | PRN
  Administered 2017-06-19 (×2): via INTRAVENOUS

## 2017-06-19 MED ORDER — ROCURONIUM BROMIDE 100 MG/10ML IV SOLN
INTRAVENOUS | Status: DC | PRN
  Administered 2017-06-19 (×2): 100 mg via INTRAVENOUS
  Administered 2017-06-19: 70 mg via INTRAVENOUS

## 2017-06-19 MED ORDER — DOCUSATE SODIUM 100 MG PO CAPS
200.0000 mg | ORAL_CAPSULE | Freq: Every day | ORAL | Status: DC
  Administered 2017-06-20 – 2017-06-21 (×2): 200 mg via ORAL
  Filled 2017-06-19 (×2): qty 2

## 2017-06-19 MED ORDER — FENTANYL CITRATE (PF) 250 MCG/5ML IJ SOLN
INTRAMUSCULAR | Status: AC
  Filled 2017-06-19: qty 10

## 2017-06-19 MED ORDER — ACETAMINOPHEN 650 MG RE SUPP
650.0000 mg | Freq: Once | RECTAL | Status: AC
  Administered 2017-06-19: 650 mg via RECTAL

## 2017-06-19 MED ORDER — NITROGLYCERIN IN D5W 200-5 MCG/ML-% IV SOLN
2.0000 ug/min | INTRAVENOUS | Status: DC
  Filled 2017-06-19: qty 250

## 2017-06-19 MED ORDER — TRANEXAMIC ACID 1000 MG/10ML IV SOLN
1.5000 mg/kg/h | INTRAVENOUS | Status: DC
  Filled 2017-06-19: qty 25

## 2017-06-19 MED ORDER — BISACODYL 10 MG RE SUPP: 10.0000 mg | Freq: Every day | RECTAL | Status: DC

## 2017-06-19 MED ORDER — SODIUM CHLORIDE 0.9 % IV SOLN
INTRAVENOUS | Status: DC | PRN
  Administered 2017-06-19: 0.2 ug/kg/h via INTRAVENOUS

## 2017-06-19 MED ORDER — NITROGLYCERIN IN D5W 200-5 MCG/ML-% IV SOLN
INTRAVENOUS | Status: DC | PRN
  Administered 2017-06-19: 16.6 ug/min via INTRAVENOUS

## 2017-06-19 MED ORDER — POTASSIUM CHLORIDE 2 MEQ/ML IV SOLN
80.0000 meq | INTRAVENOUS | Status: DC
  Filled 2017-06-19: qty 40

## 2017-06-19 MED ORDER — MILRINONE LACTATE IN DEXTROSE 20-5 MG/100ML-% IV SOLN
0.1250 ug/kg/min | INTRAVENOUS | Status: DC
  Filled 2017-06-19: qty 100

## 2017-06-19 MED ORDER — METOPROLOL TARTRATE 12.5 MG HALF TABLET
12.5000 mg | ORAL_TABLET | Freq: Two times a day (BID) | ORAL | Status: DC
  Administered 2017-06-21: 12.5 mg via ORAL
  Filled 2017-06-19: qty 1

## 2017-06-19 MED ORDER — ALBUMIN HUMAN 5 % IV SOLN
250.0000 mL | INTRAVENOUS | Status: AC | PRN
  Administered 2017-06-19 (×2): 250 mL via INTRAVENOUS

## 2017-06-19 MED ORDER — HEPARIN SODIUM (PORCINE) 1000 UNIT/ML IJ SOLN
INTRAMUSCULAR | Status: AC
  Filled 2017-06-19: qty 1

## 2017-06-19 MED ORDER — LACTATED RINGERS IV SOLN: 500.0000 mL | Freq: Once | INTRAVENOUS | Status: DC | PRN

## 2017-06-19 MED ORDER — VANCOMYCIN HCL 10 G IV SOLR
1250.0000 mg | INTRAVENOUS | Status: AC
  Administered 2017-06-19: 1250 mg via INTRAVENOUS
  Filled 2017-06-19: qty 1250

## 2017-06-19 MED ORDER — PANTOPRAZOLE SODIUM 40 MG PO TBEC
40.0000 mg | DELAYED_RELEASE_TABLET | Freq: Every day | ORAL | Status: DC
  Administered 2017-06-21: 40 mg via ORAL
  Filled 2017-06-19 (×2): qty 1

## 2017-06-19 MED ORDER — POTASSIUM CHLORIDE 10 MEQ/50ML IV SOLN
10.0000 meq | INTRAVENOUS | Status: AC
  Administered 2017-06-19 (×3): 10 meq via INTRAVENOUS

## 2017-06-19 MED ORDER — MAGNESIUM SULFATE 50 % IJ SOLN
40.0000 meq | INTRAMUSCULAR | Status: DC
  Filled 2017-06-19: qty 9.85

## 2017-06-19 MED ORDER — METOPROLOL TARTRATE 5 MG/5ML IV SOLN: 2.5000 mg | INTRAVENOUS | Status: DC | PRN

## 2017-06-19 MED ORDER — ORAL CARE MOUTH RINSE
15.0000 mL | Freq: Four times a day (QID) | OROMUCOSAL | Status: DC
  Administered 2017-06-19 – 2017-06-20 (×2): 15 mL via OROMUCOSAL

## 2017-06-19 MED ORDER — LACTATED RINGERS IV SOLN
INTRAVENOUS | Status: DC | PRN
  Administered 2017-06-19: 08:00:00 via INTRAVENOUS

## 2017-06-19 MED ORDER — DEXTROSE 5 % IV SOLN
750.0000 mg | INTRAVENOUS | Status: DC
  Filled 2017-06-19: qty 750

## 2017-06-19 MED ORDER — SODIUM CHLORIDE 0.9 % IJ SOLN
INTRAMUSCULAR | Status: DC | PRN
  Administered 2017-06-19 (×3): 4 mL via TOPICAL

## 2017-06-19 MED ORDER — SODIUM BICARBONATE 8.4 % IV SOLN
50.0000 meq | Freq: Once | INTRAVENOUS | Status: AC
  Administered 2017-06-19: 50 meq via INTRAVENOUS

## 2017-06-19 MED ORDER — BISACODYL 5 MG PO TBEC
10.0000 mg | DELAYED_RELEASE_TABLET | Freq: Every day | ORAL | Status: DC
  Administered 2017-06-20 – 2017-06-21 (×2): 10 mg via ORAL
  Filled 2017-06-19 (×2): qty 2

## 2017-06-19 MED ORDER — CHLORHEXIDINE GLUCONATE 0.12% ORAL RINSE (MEDLINE KIT)
15.0000 mL | Freq: Two times a day (BID) | OROMUCOSAL | Status: DC
  Administered 2017-06-19 – 2017-06-20 (×2): 15 mL via OROMUCOSAL

## 2017-06-19 SURGICAL SUPPLY — 71 items
BAG DECANTER FOR FLEXI CONT (MISCELLANEOUS) ×2 IMPLANT
BANDAGE ACE 4X5 VEL STRL LF (GAUZE/BANDAGES/DRESSINGS) IMPLANT
BANDAGE ACE 6X5 VEL STRL LF (GAUZE/BANDAGES/DRESSINGS) IMPLANT
BANDAGE ELASTIC 4 VELCRO ST LF (GAUZE/BANDAGES/DRESSINGS) ×2 IMPLANT
BANDAGE ELASTIC 6 VELCRO ST LF (GAUZE/BANDAGES/DRESSINGS) ×2 IMPLANT
BLADE STERNUM SYSTEM 6 (BLADE) ×2 IMPLANT
BNDG GAUZE ELAST 4 BULKY (GAUZE/BANDAGES/DRESSINGS) ×2 IMPLANT
CANISTER SUCT 3000ML PPV (MISCELLANEOUS) ×2 IMPLANT
CATH CPB KIT GERHARDT (MISCELLANEOUS) ×2 IMPLANT
CATH THORACIC 28FR (CATHETERS) ×2 IMPLANT
CLIP FOGARTY SPRING 6M (CLIP) ×2 IMPLANT
CLIP VESOCCLUDE SM WIDE 24/CT (CLIP) ×8 IMPLANT
COVER SURGICAL LIGHT HANDLE (MISCELLANEOUS) ×2 IMPLANT
CRADLE DONUT ADULT HEAD (MISCELLANEOUS) ×2 IMPLANT
DRAIN CHANNEL 28F RND 3/8 FF (WOUND CARE) ×2 IMPLANT
DRAPE CARDIOVASCULAR INCISE (DRAPES) ×1
DRAPE SLUSH/WARMER DISC (DRAPES) ×2 IMPLANT
DRAPE SRG 135X102X78XABS (DRAPES) ×1 IMPLANT
DRSG AQUACEL AG ADV 3.5X14 (GAUZE/BANDAGES/DRESSINGS) ×2 IMPLANT
ELECT BLADE 4.0 EZ CLEAN MEGAD (MISCELLANEOUS) ×2
ELECT REM PT RETURN 9FT ADLT (ELECTROSURGICAL) ×4
ELECTRODE BLDE 4.0 EZ CLN MEGD (MISCELLANEOUS) ×1 IMPLANT
ELECTRODE REM PT RTRN 9FT ADLT (ELECTROSURGICAL) ×2 IMPLANT
FELT TEFLON 1X6 (MISCELLANEOUS) ×2 IMPLANT
GAUZE SPONGE 4X4 12PLY STRL (GAUZE/BANDAGES/DRESSINGS) IMPLANT
GAUZE SPONGE 4X4 12PLY STRL LF (GAUZE/BANDAGES/DRESSINGS) ×4 IMPLANT
GLOVE BIO SURGEON STRL SZ 6.5 (GLOVE) ×6 IMPLANT
GOWN STRL REUS W/ TWL LRG LVL3 (GOWN DISPOSABLE) ×4 IMPLANT
GOWN STRL REUS W/TWL LRG LVL3 (GOWN DISPOSABLE) ×4
HEMOSTAT POWDER SURGIFOAM 1G (HEMOSTASIS) ×6 IMPLANT
HEMOSTAT SURGICEL 2X14 (HEMOSTASIS) ×2 IMPLANT
KIT BASIN OR (CUSTOM PROCEDURE TRAY) ×2 IMPLANT
KIT CATH SUCT 8FR (CATHETERS) ×2 IMPLANT
KIT ROOM TURNOVER OR (KITS) ×2 IMPLANT
KIT SUCTION CATH 14FR (SUCTIONS) ×4 IMPLANT
KIT VASOVIEW HEMOPRO VH 3000 (KITS) ×2 IMPLANT
LEAD PACING MYOCARDI (MISCELLANEOUS) ×2 IMPLANT
MARKER GRAFT CORONARY BYPASS (MISCELLANEOUS) ×6 IMPLANT
NS IRRIG 1000ML POUR BTL (IV SOLUTION) ×10 IMPLANT
PACK E OPEN HEART (SUTURE) ×2 IMPLANT
PACK OPEN HEART (CUSTOM PROCEDURE TRAY) ×2 IMPLANT
PAD ARMBOARD 7.5X6 YLW CONV (MISCELLANEOUS) ×4 IMPLANT
PAD ELECT DEFIB RADIOL ZOLL (MISCELLANEOUS) ×2 IMPLANT
PENCIL BUTTON HOLSTER BLD 10FT (ELECTRODE) ×2 IMPLANT
PUNCH AORTIC ROTATE  4.5MM 8IN (MISCELLANEOUS) ×2 IMPLANT
SET CARDIOPLEGIA MPS 5001102 (MISCELLANEOUS) ×2 IMPLANT
SPONGE LAP 18X18 X RAY DECT (DISPOSABLE) ×4 IMPLANT
SUT BONE WAX W31G (SUTURE) ×2 IMPLANT
SUT PROLENE 3 0 SH1 36 (SUTURE) ×2 IMPLANT
SUT PROLENE 4 0 TF (SUTURE) ×6 IMPLANT
SUT PROLENE 6 0 C 1 30 (SUTURE) ×4 IMPLANT
SUT PROLENE 6 0 CC (SUTURE) ×6 IMPLANT
SUT PROLENE 7 0 BV1 MDA (SUTURE) ×6 IMPLANT
SUT PROLENE 8 0 BV175 6 (SUTURE) ×4 IMPLANT
SUT SILK  1 MH (SUTURE) ×1
SUT SILK 1 MH (SUTURE) ×1 IMPLANT
SUT STEEL 6MS V (SUTURE) ×2 IMPLANT
SUT STEEL SZ 6 DBL 3X14 BALL (SUTURE) ×2 IMPLANT
SUT VIC AB 1 CTX 18 (SUTURE) ×4 IMPLANT
SUT VIC AB 2-0 CT1 27 (SUTURE) ×1
SUT VIC AB 2-0 CT1 TAPERPNT 27 (SUTURE) ×1 IMPLANT
SUT VIC AB 3-0 X1 27 (SUTURE) ×2 IMPLANT
SYSTEM SAHARA CHEST DRAIN ATS (WOUND CARE) ×2 IMPLANT
TAPE CLOTH SURG 4X10 WHT LF (GAUZE/BANDAGES/DRESSINGS) ×2 IMPLANT
TAPE PAPER 2X10 WHT MICROPORE (GAUZE/BANDAGES/DRESSINGS) ×2 IMPLANT
TOWEL GREEN STERILE (TOWEL DISPOSABLE) ×2 IMPLANT
TOWEL GREEN STERILE FF (TOWEL DISPOSABLE) ×2 IMPLANT
TRAY FOLEY SILVER 16FR TEMP (SET/KITS/TRAYS/PACK) ×2 IMPLANT
TUBING INSUFFLATION (TUBING) ×2 IMPLANT
UNDERPAD 30X30 (UNDERPADS AND DIAPERS) ×2 IMPLANT
WATER STERILE IRR 1000ML POUR (IV SOLUTION) ×4 IMPLANT

## 2017-06-19 NOTE — Transfer of Care (Signed)
Immediate Anesthesia Transfer of Care Note  Patient: Curtis Sherman  Procedure(s) Performed: CORONARY ARTERY BYPASS GRAFTING (CABG) x 3 WITH RIGHT ENDOVEIN HARVESTING (N/A Chest)  Patient Location: SICU  Anesthesia Type:General  Level of Consciousness: Patient remains intubated per anesthesia plan  Airway & Oxygen Therapy: Patient remains intubated per anesthesia plan and Patient placed on Ventilator (see vital sign flow sheet for setting)  Post-op Assessment: Report given to RN and Post -op Vital signs reviewed and stable  Post vital signs: Reviewed and stable  Last Vitals:  Vitals:   06/19/17 1558 06/19/17 1600  BP: (!) 101/43   Pulse: 90   Resp: 12   Temp:    SpO2: 93% 93%    Last Pain:  Vitals:   06/19/17 0400  TempSrc:   PainSc: 0-No pain         Complications: No apparent anesthesia complications

## 2017-06-19 NOTE — Procedures (Signed)
Extubation Procedure Note  Patient Details:   Name: Curtis Sherman DOB: 1955/08/08 MRN: [id number]   Airway Documentation:  Airway 8 mm (Active)  Secured at (cm) 23 cm 06/19/2017  4:00 PM  Measured From Lips 06/19/2017  4:00 PM  Secured Location Right 06/19/2017  4:00 PM  Secured By Pink Tape 06/19/2017  4:00 PM  Site Condition Dry 06/19/2017  4:00 PM    Evaluation  O2 sats: stable throughout Complications: No apparent complications Patient did tolerate procedure well. Bilateral Breath Sounds: Clear   Yes Patient extubated to 4L nasal canulla without complication.  Sats 98% on 4L.  Patient able to vocalize.  NIF and VC not measured due to excessive coughing and OK per Dr Dorris FetchHendrickson.  Patient follows commands, lifts head, sticks out tongue and pulls good volumes through the vent prior to extubation.   Donnelly AngelicaMcCollum, Tashanna Dolin Ann 06/19/2017, 7:37 PM

## 2017-06-19 NOTE — Progress Notes (Signed)
Increased RR to 16 based on ABG results.

## 2017-06-19 NOTE — Anesthesia Procedure Notes (Signed)
Central Venous Catheter Insertion Performed by: Beryle LatheBrock, Thomas E, MD, anesthesiologist Start/End12/21/2018 8:00 AM, 06/19/2017 8:06 AM Patient location: Pre-op. Preanesthetic checklist: patient identified, IV checked, risks and benefits discussed, surgical consent, monitors and equipment checked, pre-op evaluation, timeout performed and anesthesia consent Hand hygiene performed  and maximum sterile barriers used  Total catheter length 10. PA cath was placed.Swan type:thermodilution PA Cath depth:50 Procedure performed without using ultrasound guided technique. Attempts: 1 Patient tolerated the procedure well with no immediate complications.

## 2017-06-19 NOTE — Progress Notes (Signed)
      [address]       [place] [id number]             [phone]      S/p CABG  Waking up  BP 98/67   Pulse 80   Temp (!) 96.8 F (36 C)   Resp 16   Ht 6\' 1"  (1.854 m)   Wt 183 lb 10.3 oz (83.3 kg)   SpO2 97%   BMI 24.23 kg/m   CI= 2.3  Intake/Output Summary (Last 24 hours) at 06/19/2017 1926 Last data filed at 06/19/2017 1900 Gross per 24 hour  Intake 4005.09 ml  Output 2335 ml  Net 1670.09 ml   Doing well early postop  Viviann SpareSteven C. Dorris FetchHendrickson, MD [facility] [phone]

## 2017-06-19 NOTE — Anesthesia Procedure Notes (Signed)
Procedure Name: Intubation Date/Time: 06/19/2017 9:57 AM Performed by: Annis Lagoy T, CRNA Pre-anesthesia Checklist: Patient identified, Emergency Drugs available, Suction available and Patient being monitored Patient Re-evaluated:Patient Re-evaluated prior to induction Oxygen Delivery Method: Circle system utilized and Simple face mask Preoxygenation: Pre-oxygenation with 100% oxygen Induction Type: IV induction Ventilation: Mask ventilation without difficulty and Oral airway inserted - appropriate to patient size Laryngoscope Size: Miller and 3 Grade View: Grade I Tube type: Subglottic suction tube Tube size: 8.0 mm Number of attempts: 1 Airway Equipment and Method: Patient positioned with wedge pillow and Stylet Placement Confirmation: ETT inserted through vocal cords under direct vision,  positive ETCO2 and breath sounds checked- equal and bilateral Secured at: 22 cm Tube secured with: Tape Dental Injury: Teeth and Oropharynx as per pre-operative assessment

## 2017-06-19 NOTE — Progress Notes (Signed)
RT called to room by RN to perform recruitment maneuver. [facility] with total PC of 40, RR 10, PEEP 5, and I:E time of 1:1. Patient tolerated well the total 2 minutes. Vitals are stable and sats are 97%. RT will continue to monitor.

## 2017-06-19 NOTE — Anesthesia Procedure Notes (Signed)
Central Venous Catheter Insertion Performed by: Beryle LatheBrock, Thomas E, MD, anesthesiologist Start/End12/21/2018 7:50 AM, 06/19/2017 8:00 AM Patient location: Pre-op. Preanesthetic checklist: patient identified, IV checked, risks and benefits discussed, surgical consent, monitors and equipment checked, pre-op evaluation, timeout performed and anesthesia consent Position: Trendelenburg Lidocaine 1% used for infiltration and patient sedated Hand hygiene performed , maximum sterile barriers used  and Seldinger technique used Catheter size: 8.5 Fr Total catheter length 10. Central line was placed.Sheath introducer Swan type:thermodilution PA Cath depth:50 Procedure performed using ultrasound guided technique. Ultrasound Notes:anatomy identified, needle tip was noted to be adjacent to the nerve/plexus identified, no ultrasound evidence of intravascular and/or intraneural injection and image(s) printed for medical record Attempts: 1 Following insertion, line sutured and dressing applied. Post procedure assessment: blood return through all ports, free fluid flow and no air  Patient tolerated the procedure well with no immediate complications.

## 2017-06-19 NOTE — Progress Notes (Signed)
      [address]       [place] [id number]             [phone]     Pre Procedure note for inpatients:   Curtis Sherman has been scheduled for Procedure(s): CORONARY ARTERY BYPASS GRAFTING (CABG) WITH ENDOVEIN (N/A) today. The various methods of treatment have been discussed with the patient. After consideration of the risks, benefits and treatment options the patient has consented to the planned procedure.   The patient has been seen and labs reviewed. There are no changes in the patient's condition to prevent proceeding with the planned procedure today.  Recent labs:  Lab Results  Component Value Date   WBC 7.1 06/18/2017   HGB 16.4 06/18/2017   HCT 47.6 06/18/2017   PLT 116 (L) 06/18/2017   GLUCOSE 102 (H) 06/18/2017   CHOL 173 06/17/2017   TRIG 85 06/17/2017   HDL 47 06/17/2017   LDLCALC 109 (H) 06/17/2017   ALT 37 06/17/2017   AST 37 06/17/2017   NA 137 06/18/2017   K 4.2 06/18/2017   CL 105 06/18/2017   CREATININE 0.77 06/18/2017   BUN 10 06/18/2017   CO2 24 06/18/2017   INR 1.12 06/17/2017   HGBA1C 6.1 (H) 06/17/2017    Delight OvensEdward B Buffy Ehler, MD 06/19/2017 7:47 AM

## 2017-06-19 NOTE — Anesthesia Procedure Notes (Signed)
Arterial Line Insertion Start/End12/21/2018 8:34 AM, 06/19/2017 8:39 AM Performed by: Beryle LatheBrock, Thomas E, MD, anesthesiologist  Patient location: Pre-op. Preanesthetic checklist: patient identified, IV checked, risks and benefits discussed, surgical consent, monitors and equipment checked, pre-op evaluation, timeout performed and anesthesia consent Lidocaine 1% used for infiltration radial was placed Catheter size: 20 Fr Hand hygiene performed  and maximum sterile barriers used   Attempts: 3 (2 attempts by CRNA prior to one attempt by MD) Procedure performed using ultrasound guided technique. Ultrasound Notes:anatomy identified, needle tip was noted to be adjacent to the nerve/plexus identified, no ultrasound evidence of intravascular and/or intraneural injection and image(s) printed for medical record Following insertion, dressing applied and Biopatch. Post procedure assessment: normal and unchanged  Patient tolerated the procedure well with no immediate complications.

## 2017-06-20 ENCOUNTER — Encounter (HOSPITAL_COMMUNITY): Payer: Self-pay

## 2017-06-20 ENCOUNTER — Inpatient Hospital Stay (HOSPITAL_COMMUNITY): Payer: Medicare HMO

## 2017-06-20 DIAGNOSIS — Z951 Presence of aortocoronary bypass graft: Secondary | ICD-10-CM

## 2017-06-20 LAB — CBC
HCT: 43 % (ref 39.0–52.0)
HEMATOCRIT: 42.9 % (ref 39.0–52.0)
Hemoglobin: 14.4 g/dL (ref 13.0–17.0)
Hemoglobin: 14.3 g/dL (ref 13.0–17.0)
MCH: 31 pg (ref 26.0–34.0)
MCH: 31.8 pg (ref 26.0–34.0)
MCHC: 33.5 g/dL (ref 30.0–36.0)
MCHC: 33.3 g/dL (ref 30.0–36.0)
MCV: 92.7 fL (ref 78.0–100.0)
MCV: 95.3 fL (ref 78.0–100.0)
PLATELETS: 84 10*3/uL — AB (ref 150–400)
Platelets: 90 10*3/uL — ABNORMAL LOW (ref 150–400)
RBC: 4.64 MIL/uL (ref 4.22–5.81)
RBC: 4.5 MIL/uL (ref 4.22–5.81)
RDW: 13.4 % (ref 11.5–15.5)
RDW: 13.7 % (ref 11.5–15.5)
WBC: 10.2 10*3/uL (ref 4.0–10.5)
WBC: 10.6 10*3/uL — ABNORMAL HIGH (ref 4.0–10.5)

## 2017-06-20 LAB — GLUCOSE, CAPILLARY
GLUCOSE-CAPILLARY: 126 mg/dL — AB (ref 65–99)
GLUCOSE-CAPILLARY: 143 mg/dL — AB (ref 65–99)
GLUCOSE-CAPILLARY: 167 mg/dL — AB (ref 65–99)
Glucose-Capillary: 107 mg/dL — ABNORMAL HIGH (ref 65–99)
Glucose-Capillary: 228 mg/dL — ABNORMAL HIGH (ref 65–99)

## 2017-06-20 LAB — BASIC METABOLIC PANEL
Anion gap: 6 (ref 5–15)
Anion gap: 7 (ref 5–15)
BUN: 11 mg/dL (ref 6–20)
BUN: 15 mg/dL (ref 6–20)
CALCIUM: 8.5 mg/dL — AB (ref 8.9–10.3)
CO2: 23 mmol/L (ref 22–32)
CO2: 25 mmol/L (ref 22–32)
CREATININE: 0.76 mg/dL (ref 0.61–1.24)
Calcium: 8.6 mg/dL — ABNORMAL LOW (ref 8.9–10.3)
Chloride: 108 mmol/L (ref 101–111)
Chloride: 101 mmol/L (ref 101–111)
Creatinine, Ser: 0.8 mg/dL (ref 0.61–1.24)
GFR calc Af Amer: >60 mL/min (ref >60)
GFR calc non Af Amer: >60 mL/min (ref >60)
GFR calc non Af Amer: >60 mL/min (ref >60)
Glucose, Bld: 123 mg/dL — ABNORMAL HIGH (ref 65–99)
Glucose, Bld: 120 mg/dL — ABNORMAL HIGH (ref 65–99)
Potassium: 4.6 mmol/L (ref 3.5–5.1)
Potassium: 4.7 mmol/L (ref 3.5–5.1)
SODIUM: 137 mmol/L (ref 135–145)
Sodium: 133 mmol/L — ABNORMAL LOW (ref 135–145)

## 2017-06-20 LAB — MAGNESIUM
MAGNESIUM: 2.1 mg/dL (ref 1.7–2.4)
Magnesium: 1.9 mg/dL (ref 1.7–2.4)

## 2017-06-20 LAB — NASOPHARYNGEAL CULTURE: CULTURE: NOT DETECTED

## 2017-06-20 MED ORDER — ENOXAPARIN SODIUM 40 MG/0.4ML ~~LOC~~ SOLN
40.0000 mg | Freq: Every day | SUBCUTANEOUS | Status: DC
  Administered 2017-06-20 – 2017-06-21 (×2): 40 mg via SUBCUTANEOUS
  Filled 2017-06-20 (×2): qty 0.4

## 2017-06-20 MED ORDER — SODIUM CHLORIDE 0.9% FLUSH
10.0000 mL | Freq: Two times a day (BID) | INTRAVENOUS | Status: DC
  Administered 2017-06-20: 10 mL

## 2017-06-20 MED ORDER — ORAL CARE MOUTH RINSE
15.0000 mL | Freq: Two times a day (BID) | OROMUCOSAL | Status: DC
  Administered 2017-06-21 – 2017-06-23 (×3): 15 mL via OROMUCOSAL

## 2017-06-20 MED ORDER — EZETIMIBE-SIMVASTATIN 10-40 MG PO TABS
1.0000 | ORAL_TABLET | Freq: Every day | ORAL | Status: DC
  Administered 2017-06-20 – 2017-06-21 (×2): 1 via ORAL
  Filled 2017-06-20 (×3): qty 1

## 2017-06-20 MED ORDER — INSULIN ASPART 100 UNIT/ML ~~LOC~~ SOLN
0.0000 [IU] | SUBCUTANEOUS | Status: DC
  Administered 2017-06-20: 8 [IU] via SUBCUTANEOUS
  Administered 2017-06-20: 2 [IU] via SUBCUTANEOUS
  Administered 2017-06-20: 4 [IU] via SUBCUTANEOUS

## 2017-06-20 MED ORDER — CYCLOBENZAPRINE HCL 10 MG PO TABS: 5.0000 mg | ORAL_TABLET | Freq: Three times a day (TID) | ORAL | Status: DC | PRN

## 2017-06-20 MED ORDER — SODIUM CHLORIDE 0.9% FLUSH: 10.0000 mL | INTRAVENOUS | Status: DC | PRN

## 2017-06-20 MED ORDER — FUROSEMIDE 10 MG/ML IJ SOLN
20.0000 mg | Freq: Two times a day (BID) | INTRAMUSCULAR | Status: AC
  Administered 2017-06-20 (×2): 20 mg via INTRAVENOUS
  Filled 2017-06-20 (×2): qty 2

## 2017-06-20 MED ORDER — CHLORHEXIDINE GLUCONATE CLOTH 2 % EX PADS
6.0000 | MEDICATED_PAD | Freq: Every day | CUTANEOUS | Status: DC
  Administered 2017-06-22 (×2): 6 via TOPICAL

## 2017-06-20 NOTE — Progress Notes (Signed)
      [address]       [place] [id number]             [phone]      POD # 1  Up in chair, some discomfort, overall feels well  Blood pressure 118/71, pulse 86, temperature 98.5 F (36.9 C), temperature source Oral, resp. rate 20, height 6\' 1"  (1.854 m), weight 190 lb 11.2 oz (86.5 kg), SpO2 100 %.   Intake/Output Summary (Last 24 hours) at 06/20/2017 1742 Last data filed at 06/20/2017 1400 Gross per 24 hour  Intake 2450.69 ml  Output 1768 ml  Net 682.69 ml   PM labs pending  CBG OK  Doing well POD # 1  Tanyah Debruyne C. Dorris FetchHendrickson, MD [facility] [phone]

## 2017-06-20 NOTE — Progress Notes (Signed)
1 Day Post-Op Procedure(s) (LRB): CORONARY ARTERY BYPASS GRAFTING (CABG) x 3 WITH RIGHT ENDOVEIN HARVESTING (N/A) Subjective: Not much pain, denies any anginal type pain or nausea  Objective: Vital signs in last 24 hours: Temp:  [96.3 F (35.7 C)-99.5 F (37.5 C)] 98.3 F (36.8 C) (12/22 0924) Pulse Rate:  [57-90] 80 (12/22 0800) Cardiac Rhythm: A-V Sequential paced (12/22 0800) Resp:  [11-24] 15 (12/22 0800) BP: (88-120)/(43-80) 94/63 (12/22 0800) SpO2:  [93 %-100 %] 98 % (12/22 0800) Arterial Line BP: (84-130)/(45-70) 100/53 (12/22 0800) FiO2 (%):  [40 %-50 %] 40 % (12/21 1855) Weight:  [190 lb 11.2 oz (86.5 kg)] 190 lb 11.2 oz (86.5 kg) (12/22 0400)  Hemodynamic parameters for last 24 hours: PAP: (17-49)/(7-24) 27/14 CO:  [4.3 L/min-6.9 L/min] 4.3 L/min CI:  [2.2 L/min/m2-3.3 L/min/m2] 2.2 L/min/m2  Intake/Output from previous day: 12/21 0701 - 12/22 0700 In: 5138.6 [I.V.:3388.6; Blood:800; IV Piggyback:950] Out: 3143 [Urine:2640; Emesis/NG output:10; Blood:250; Chest Tube:243] Intake/Output this shift: Total I/O In: 341 [P.O.:240; I.V.:101] Out: 80 [Urine:30; Chest Tube:50]  General appearance: alert, cooperative and no distress Neurologic: intact Heart: regular rate and rhythm Lungs: diminished breath sounds bibasilar Abdomen: normal findings: soft, non-tender  Lab Results: Recent Labs    06/19/17 2145 06/19/17 2155 06/20/17 0305  WBC 10.8*  --  10.2  HGB 14.3 14.3 14.4  HCT 42.4 42.0 43.0  PLT 82*  --  90*   BMET:  Recent Labs    06/18/17 0530  06/19/17 2155 06/20/17 0305  NA 137   < > 141 137  K 4.2   < > 4.9 4.6  CL 105   < > 103 108  CO2 24  --   --  23  GLUCOSE 102*   < > 148* 123*  BUN 10   < > 12 11  CREATININE 0.77   < > 0.60* 0.76  CALCIUM 9.2  --   --  8.5*   < > = values in this interval not displayed.    PT/INR:  Recent Labs    06/19/17 1606  LABPROT 16.6*  INR 1.36   ABG    Component Value Date/Time   PHART 7.307 (L)  06/19/2017 2039   HCO3 22.3 06/19/2017 2039   TCO2 24 06/19/2017 2155   ACIDBASEDEF 4.0 (H) 06/19/2017 2039   O2SAT 97.0 06/19/2017 2039   CBG (last 3)  Recent Labs    06/19/17 2322 06/20/17 0314 06/20/17 0802  GLUCAP 138* 107* 126*    Assessment/Plan: S/P Procedure(s) (LRB): CORONARY ARTERY BYPASS GRAFTING (CABG) x 3 WITH RIGHT ENDOVEIN HARVESTING (N/A) POD # 1 doing well  NEURO- intact CV- stable in SR in 60s with good index  ECG shows peaked T waves in V2 and 3- no significant change from yesterday, clinically no signs of ischemia  RESP_ IS for basilar atelectasis  RENAL- creatinine and lytes OK  ENDO- CBG well controlled- CBG/ SSI Q 4  DC chest tubes  Advance diet  mobiloize   LOS: 3 days    Loreli SlotSteven C Hendrickson 06/20/2017

## 2017-06-20 NOTE — Plan of Care (Signed)
POD #1 progressing well. CTT, Swan, Aline out. Increasing activity. Doing excellent with Is.

## 2017-06-20 NOTE — Progress Notes (Signed)
Patient in the chair, felt "odd" nauseous. Assisted back in bed. No change in VS, Perhaps a little further ST depression, slight pericardial rub ausculted. Dr. Dorris FetchHendrickson called. Patient resting with eyes closed, zofran administered. No orders at this time.

## 2017-06-20 NOTE — Progress Notes (Signed)
EKG CRITICAL VALUE     12 lead EKG performed.  Critical value noted.  Steph, RN notified.   Rachel BoMiranda M Brittish Bolinger, CCT 06/20/2017 12:42 PM

## 2017-06-21 ENCOUNTER — Inpatient Hospital Stay (HOSPITAL_COMMUNITY): Payer: Medicare HMO

## 2017-06-21 LAB — CBC
HEMATOCRIT: 38.8 % — AB (ref 39.0–52.0)
Hemoglobin: 12.8 g/dL — ABNORMAL LOW (ref 13.0–17.0)
MCH: 30.9 pg (ref 26.0–34.0)
MCHC: 33 g/dL (ref 30.0–36.0)
MCV: 93.7 fL (ref 78.0–100.0)
PLATELETS: 76 10*3/uL — AB (ref 150–400)
RBC: 4.14 MIL/uL — ABNORMAL LOW (ref 4.22–5.81)
RDW: 13.5 % (ref 11.5–15.5)
WBC: 8.4 10*3/uL (ref 4.0–10.5)

## 2017-06-21 LAB — BASIC METABOLIC PANEL
Anion gap: 3 — ABNORMAL LOW (ref 5–15)
BUN: 10 mg/dL (ref 6–20)
CHLORIDE: 104 mmol/L (ref 101–111)
CO2: 30 mmol/L (ref 22–32)
CREATININE: 0.71 mg/dL (ref 0.61–1.24)
Calcium: 8.6 mg/dL — ABNORMAL LOW (ref 8.9–10.3)
GFR calc Af Amer: >60 mL/min (ref >60)
GLUCOSE: 87 mg/dL (ref 65–99)
POTASSIUM: 3.8 mmol/L (ref 3.5–5.1)
Sodium: 137 mmol/L (ref 135–145)

## 2017-06-21 LAB — GLUCOSE, CAPILLARY
GLUCOSE-CAPILLARY: 121 mg/dL — AB (ref 65–99)
GLUCOSE-CAPILLARY: 107 mg/dL — AB (ref 65–99)
GLUCOSE-CAPILLARY: 128 mg/dL — AB (ref 65–99)
Glucose-Capillary: 81 mg/dL (ref 65–99)
Glucose-Capillary: 115 mg/dL — ABNORMAL HIGH (ref 65–99)

## 2017-06-21 MED ORDER — AMIODARONE HCL IN DEXTROSE 360-4.14 MG/200ML-% IV SOLN
60.0000 mg/h | INTRAVENOUS | Status: AC
  Administered 2017-06-21: 60 mg/h via INTRAVENOUS

## 2017-06-21 MED ORDER — AMIODARONE HCL IN DEXTROSE 360-4.14 MG/200ML-% IV SOLN
30.0000 mg/h | INTRAVENOUS | Status: DC
  Administered 2017-06-21: 30 mg/h via INTRAVENOUS
  Filled 2017-06-21 (×2): qty 200

## 2017-06-21 MED ORDER — METOPROLOL TARTRATE 25 MG PO TABS
25.0000 mg | ORAL_TABLET | Freq: Two times a day (BID) | ORAL | Status: DC
  Administered 2017-06-21: 25 mg via ORAL
  Filled 2017-06-21: qty 1

## 2017-06-21 MED ORDER — AMIODARONE HCL IN DEXTROSE 360-4.14 MG/200ML-% IV SOLN
INTRAVENOUS | Status: AC
  Administered 2017-06-21: 60 mg/h via INTRAVENOUS
  Filled 2017-06-21: qty 200

## 2017-06-21 MED ORDER — POTASSIUM CHLORIDE 10 MEQ/50ML IV SOLN
10.0000 meq | INTRAVENOUS | Status: AC
  Administered 2017-06-21 (×4): 10 meq via INTRAVENOUS
  Filled 2017-06-21 (×5): qty 50

## 2017-06-21 MED ORDER — KETOROLAC TROMETHAMINE 15 MG/ML IJ SOLN: 15.0000 mg | Freq: Four times a day (QID) | INTRAMUSCULAR | Status: DC | PRN

## 2017-06-21 MED ORDER — INSULIN ASPART 100 UNIT/ML ~~LOC~~ SOLN: 0.0000 [IU] | Freq: Three times a day (TID) | SUBCUTANEOUS | Status: DC

## 2017-06-21 NOTE — Progress Notes (Signed)
Upon initial exam patient has short term memory loss, does not remember why he came to the hospital. Yesterday he remembered everything. He knows who is family is and who he is initially thought he was at [facility]. Reoriented, and since has not remembered occurences/time, but knows day, place self. Very tired, did ambulate in hall today, with 4 wheel walker, tends to gravitate towards the left. Nurse guided the walker, then patient started righting self. Bilateral grips and foot strength equal. Wife at bedside, kept updated on condition. Converted from afib to SB. Continue on amnioderone at 30.

## 2017-06-21 NOTE — Progress Notes (Signed)
RN observed new onset afib on monitor. Dr. Dorris FetchHendrickson paged orders received for amiodarone gtt. Will follow orders and continue to monitor.

## 2017-06-21 NOTE — Progress Notes (Signed)
      [address]       [place] [id number]             [phone]      Feels a little better this PM  BP 99/69 (BP Location: Right Arm)   Pulse 61   Temp 98.3 F (36.8 C) (Oral)   Resp 16   Ht 6\' 1"  (1.854 m)   Wt 190 lb 11.2 oz (86.5 kg)   SpO2 95%   BMI 25.16 kg/m   Intake/Output Summary (Last 24 hours) at 06/21/2017 1742 Last data filed at 06/21/2017 1600 Gross per 24 hour  Intake 1398.02 ml  Output 2255 ml  Net -856.98 ml   Back in SR on amiodarone  Brooklinn Longbottom C. Dorris FetchHendrickson, MD [facility] [phone]

## 2017-06-21 NOTE — Progress Notes (Signed)
2 Days Post-Op Procedure(s) (LRB): CORONARY ARTERY BYPASS GRAFTING (CABG) x 3 WITH RIGHT ENDOVEIN HARVESTING (N/A) Subjective: Doesn't feel as well this AM Went into atrial fib with RVR overnight  Objective: Vital signs in last 24 hours: Temp:  [97.9 F (36.6 C)-98.9 F (37.2 C)] 98.4 F (36.9 C) (12/23 0924) Pulse Rate:  [59-100] 83 (12/23 0800) Cardiac Rhythm: Atrial fibrillation (12/23 0800) Resp:  [12-22] 17 (12/23 0800) BP: (96-126)/(50-79) 101/70 (12/23 0800) SpO2:  [93 %-100 %] 100 % (12/23 0800) Arterial Line BP: (113)/(47) 113/47 (12/22 1000)  Hemodynamic parameters for last 24 hours:    Intake/Output from previous day: 12/22 0701 - 12/23 0700 In: 1429.6 [P.O.:720; I.V.:609.6; IV Piggyback:100] Out: 2765 [Urine:2665; Chest Tube:100] Intake/Output this shift: Total I/O In: 53.3 [I.V.:53.3] Out: 100 [Urine:100]  General appearance: alert, cooperative and mild distress Neurologic: intact Heart: irregularly irregular rhythm Lungs: diminished breath sounds bibasilar L>R Abdomen: mildly distended, nontender, + BS  Lab Results: Recent Labs    06/20/17 1721 06/21/17 0256  WBC 10.6* 8.4  HGB 14.3 12.8*  HCT 42.9 38.8*  PLT 84* 76*   BMET:  Recent Labs    06/20/17 1721 06/21/17 0256  NA 133* 137  K 4.7 3.8  CL 101 104  CO2 25 30  GLUCOSE 120* 87  BUN 15 10  CREATININE 0.80 0.71  CALCIUM 8.6* 8.6*    PT/INR:  Recent Labs    06/19/17 1606  LABPROT 16.6*  INR 1.36   ABG    Component Value Date/Time   PHART 7.307 (L) 06/19/2017 2039   HCO3 22.3 06/19/2017 2039   TCO2 24 06/19/2017 2155   ACIDBASEDEF 4.0 (H) 06/19/2017 2039   O2SAT 97.0 06/19/2017 2039   CBG (last 3)  Recent Labs    06/20/17 2326 06/21/17 0307 06/21/17 0806  GLUCAP 228* 81 115*    Assessment/Plan: S/P Procedure(s) (LRB): CORONARY ARTERY BYPASS GRAFTING (CABG) x 3 WITH RIGHT ENDOVEIN HARVESTING (N/A) -CV- atrial fib with RVR- has been bolused with amiodarone and drip  initiated  Just received metoprolol  Will increase metoprolol to 25 BID  RESP_ elevated left hemidiaphragm with associated atelectasis- IS  RENAL- supplement K  Diuresed well yesterday- hold off on any additional diuresis for now  ENDO- CBG widely variable- elevated last night, low this AM  Change to AC/HS  Thrombocytopenia- PLT count relatively stable, will check HIT Ab although my index of suspicion is low. Will continue enoxaparin unless PLT drop further   LOS: 4 days    Loreli SlotSteven C Kalen Neidert 06/21/2017

## 2017-06-22 ENCOUNTER — Inpatient Hospital Stay (HOSPITAL_COMMUNITY): Payer: Medicare HMO

## 2017-06-22 ENCOUNTER — Encounter (HOSPITAL_COMMUNITY): Payer: Self-pay | Admitting: Cardiothoracic Surgery

## 2017-06-22 DIAGNOSIS — I480 Paroxysmal atrial fibrillation: Secondary | ICD-10-CM

## 2017-06-22 LAB — GLUCOSE, CAPILLARY
GLUCOSE-CAPILLARY: 94 mg/dL (ref 65–99)
GLUCOSE-CAPILLARY: 124 mg/dL — AB (ref 65–99)

## 2017-06-22 LAB — CBC
HEMATOCRIT: 38.9 % — AB (ref 39.0–52.0)
Hemoglobin: 12.7 g/dL — ABNORMAL LOW (ref 13.0–17.0)
MCH: 30.8 pg (ref 26.0–34.0)
MCHC: 32.6 g/dL (ref 30.0–36.0)
MCV: 94.2 fL (ref 78.0–100.0)
PLATELETS: 79 10*3/uL — AB (ref 150–400)
RBC: 4.13 MIL/uL — AB (ref 4.22–5.81)
RDW: 13.4 % (ref 11.5–15.5)
WBC: 7 10*3/uL (ref 4.0–10.5)

## 2017-06-22 LAB — BASIC METABOLIC PANEL
ANION GAP: 5 (ref 5–15)
BUN: 11 mg/dL (ref 6–20)
CO2: 29 mmol/L (ref 22–32)
Calcium: 8.8 mg/dL — ABNORMAL LOW (ref 8.9–10.3)
Chloride: 102 mmol/L (ref 101–111)
Creatinine, Ser: 0.66 mg/dL (ref 0.61–1.24)
GLUCOSE: 117 mg/dL — AB (ref 65–99)
POTASSIUM: 3.9 mmol/L (ref 3.5–5.1)
Sodium: 136 mmol/L (ref 135–145)

## 2017-06-22 MED ORDER — SODIUM CHLORIDE 0.9% FLUSH
3.0000 mL | Freq: Two times a day (BID) | INTRAVENOUS | Status: DC
  Administered 2017-06-22 – 2017-06-23 (×3): 3 mL via INTRAVENOUS

## 2017-06-22 MED ORDER — CLOPIDOGREL BISULFATE 75 MG PO TABS
75.0000 mg | ORAL_TABLET | Freq: Every day | ORAL | Status: DC
  Administered 2017-06-22 – 2017-06-24 (×3): 75 mg via ORAL
  Filled 2017-06-22 (×3): qty 1

## 2017-06-22 MED ORDER — METOPROLOL TARTRATE 12.5 MG HALF TABLET
12.5000 mg | ORAL_TABLET | Freq: Two times a day (BID) | ORAL | Status: DC
  Administered 2017-06-22 – 2017-06-24 (×5): 12.5 mg via ORAL
  Filled 2017-06-22 (×5): qty 1

## 2017-06-22 MED ORDER — AMIODARONE HCL 200 MG PO TABS
200.0000 mg | ORAL_TABLET | Freq: Two times a day (BID) | ORAL | Status: DC
  Administered 2017-06-22 – 2017-06-24 (×5): 200 mg via ORAL
  Filled 2017-06-22 (×5): qty 1

## 2017-06-22 MED ORDER — ACETAMINOPHEN 325 MG PO TABS: 650.0000 mg | ORAL_TABLET | Freq: Four times a day (QID) | ORAL | Status: DC | PRN

## 2017-06-22 MED ORDER — BISACODYL 5 MG PO TBEC: 10.0000 mg | DELAYED_RELEASE_TABLET | Freq: Every day | ORAL | Status: DC | PRN

## 2017-06-22 MED ORDER — BISACODYL 10 MG RE SUPP: 10.0000 mg | Freq: Every day | RECTAL | Status: DC | PRN

## 2017-06-22 MED ORDER — SODIUM CHLORIDE 0.9% FLUSH: 3.0000 mL | INTRAVENOUS | Status: DC | PRN

## 2017-06-22 MED ORDER — SODIUM CHLORIDE 0.9 % IV SOLN: 250.0000 mL | INTRAVENOUS | Status: DC | PRN

## 2017-06-22 MED ORDER — PANTOPRAZOLE SODIUM 40 MG PO TBEC
40.0000 mg | DELAYED_RELEASE_TABLET | Freq: Every day | ORAL | Status: DC
  Administered 2017-06-22 – 2017-06-24 (×3): 40 mg via ORAL
  Filled 2017-06-22 (×3): qty 1

## 2017-06-22 MED ORDER — AMIODARONE HCL 200 MG PO TABS: 200.0000 mg | ORAL_TABLET | Freq: Every day | ORAL | Status: DC

## 2017-06-22 MED ORDER — GUAIFENESIN ER 600 MG PO TB12: 600.0000 mg | ORAL_TABLET | Freq: Two times a day (BID) | ORAL | Status: DC | PRN

## 2017-06-22 MED ORDER — ONDANSETRON HCL 4 MG PO TABS: 4.0000 mg | ORAL_TABLET | Freq: Four times a day (QID) | ORAL | Status: DC | PRN

## 2017-06-22 MED ORDER — TRAMADOL HCL 50 MG PO TABS
50.0000 mg | ORAL_TABLET | ORAL | Status: DC | PRN
  Administered 2017-06-22: 100 mg via ORAL
  Filled 2017-06-22: qty 2

## 2017-06-22 MED ORDER — MOVING RIGHT ALONG BOOK
Freq: Once | Status: AC
  Administered 2017-06-22: 10:00:00
  Filled 2017-06-22: qty 1

## 2017-06-22 MED ORDER — ONDANSETRON HCL 4 MG/2ML IJ SOLN: 4.0000 mg | Freq: Four times a day (QID) | INTRAMUSCULAR | Status: DC | PRN

## 2017-06-22 MED ORDER — OXYCODONE HCL 5 MG PO TABS: 5.0000 mg | ORAL_TABLET | ORAL | Status: DC | PRN

## 2017-06-22 MED ORDER — ROSUVASTATIN CALCIUM 10 MG PO TABS
40.0000 mg | ORAL_TABLET | Freq: Every day | ORAL | Status: DC
  Administered 2017-06-22 – 2017-06-23 (×2): 40 mg via ORAL
  Filled 2017-06-22 (×3): qty 4

## 2017-06-22 MED ORDER — INSULIN ASPART 100 UNIT/ML ~~LOC~~ SOLN: 0.0000 [IU] | Freq: Three times a day (TID) | SUBCUTANEOUS | Status: DC

## 2017-06-22 MED ORDER — ASPIRIN EC 81 MG PO TBEC
81.0000 mg | DELAYED_RELEASE_TABLET | Freq: Every day | ORAL | Status: DC
  Administered 2017-06-22 – 2017-06-24 (×3): 81 mg via ORAL
  Filled 2017-06-22 (×3): qty 1

## 2017-06-22 MED ORDER — ENOXAPARIN SODIUM 30 MG/0.3ML ~~LOC~~ SOLN
30.0000 mg | Freq: Every day | SUBCUTANEOUS | Status: DC
  Administered 2017-06-22 – 2017-06-23 (×2): 30 mg via SUBCUTANEOUS
  Filled 2017-06-22 (×2): qty 0.3

## 2017-06-22 MED FILL — Phenylephrine HCl Inj 10 MG/ML: INTRAMUSCULAR | Qty: 2 | Status: AC

## 2017-06-22 MED FILL — Sodium Chloride IV Soln 0.9%: INTRAVENOUS | Qty: 250 | Status: AC

## 2017-06-22 MED FILL — Magnesium Sulfate Inj 50%: INTRAMUSCULAR | Qty: 10 | Status: AC

## 2017-06-22 MED FILL — Heparin Sodium (Porcine) Inj 1000 Unit/ML: INTRAMUSCULAR | Qty: 30 | Status: AC

## 2017-06-22 MED FILL — Potassium Chloride Inj 2 mEq/ML: INTRAVENOUS | Qty: 40 | Status: AC

## 2017-06-22 MED FILL — Dexmedetomidine HCl in NaCl 0.9% IV Soln 400 MCG/100ML: INTRAVENOUS | Qty: 100 | Status: AC

## 2017-06-22 NOTE — Progress Notes (Signed)
   CT surgery p.m. Rounds  Patient examined and record reviewed.Hemodynamics stable,labs satisfactory.Patient had stable day.Continue current care. Kathlee Nationseter Van Trigt III 06/22/2017

## 2017-06-22 NOTE — Progress Notes (Signed)
Progress Note  Patient Name: Curtis Sherman Date of Encounter: 06/22/2017  Primary Cardiologist:   No primary care provider on file.   Subjective   Feels OK. No chest pain.  No SOB.   Inpatient Medications    Scheduled Meds: . acetaminophen  1,000 mg Oral Q6H   Or  . acetaminophen (TYLENOL) oral liquid 160 mg/5 mL  1,000 mg Per Tube Q6H  . aspirin EC  325 mg Oral Daily   Or  . aspirin  324 mg Per Tube Daily  . bisacodyl  10 mg Oral Daily   Or  . bisacodyl  10 mg Rectal Daily  . Chlorhexidine Gluconate Cloth  6 each Topical Daily  . docusate sodium  200 mg Oral Daily  . enoxaparin (LOVENOX) injection  40 mg Subcutaneous QHS  . ezetimibe-simvastatin  1 tablet Oral Daily  . insulin aspart  0-15 Units Subcutaneous TID WC  . mouth rinse  15 mL Mouth Rinse BID  . metoprolol tartrate  25 mg Oral BID  . pantoprazole  40 mg Oral Daily  . sodium chloride flush  10-40 mL Intracatheter Q12H  . sodium chloride flush  3 mL Intravenous Q12H   Continuous Infusions: . sodium chloride Stopped (06/20/17 1027)  . sodium chloride    . sodium chloride Stopped (06/20/17 0400)  . amiodarone 30 mg/hr (06/22/17 0700)  . dexmedetomidine (PRECEDEX) IV infusion Stopped (06/20/17 0514)  . lactated ringers    . lactated ringers Stopped (06/20/17 0400)  . lactated ringers 20 mL/hr at 06/22/17 0700  . nitroGLYCERIN Stopped (06/19/17 1758)  . phenylephrine (NEO-SYNEPHRINE) Adult infusion Stopped (06/20/17 1026)   PRN Meds: sodium chloride, cyclobenzaprine, ketorolac, lactated ringers, metoprolol tartrate, midazolam, morphine injection, ondansetron (ZOFRAN) IV, oxyCODONE, sodium chloride flush, sodium chloride flush, traMADol   Vital Signs    Vitals:   06/22/17 0500 06/22/17 0600 06/22/17 0700 06/22/17 0739  BP: 124/72 117/70 139/75   Pulse: (!) 54 63 63   Resp: 15 19 20    Temp:    98.1 F (36.7 C)  TempSrc:    Oral  SpO2: 96% 96% 97%   Weight:  188 lb 0.8 oz (85.3 kg)    Height:         Intake/Output Summary (Last 24 hours) at 06/22/2017 0742 Last data filed at 06/22/2017 0700 Gross per 24 hour  Intake 2161.35 ml  Output 1535 ml  Net 626.35 ml   Filed Weights   06/20/17 0400 06/21/17 1600 06/22/17 0600  Weight: 190 lb 11.2 oz (86.5 kg) 191 lb 2.2 oz (86.7 kg) 188 lb 0.8 oz (85.3 kg)    Telemetry    NSR - Personally Reviewed  ECG    NA - Personally Reviewed  Physical Exam   GEN: No acute distress.   Neck: No  JVD Cardiac: RRR, no murmurs, rubs, or gallops.  Respiratory: Clear  to auscultation bilaterally. GI: Soft, nontender, non-distended  MS: No  edema; No deformity. Neuro:  Nonfocal  Psych: Normal affect   Labs    Chemistry Recent Labs  Lab 06/17/17 1718 06/17/17 2214  06/20/17 1721 06/21/17 0256 06/22/17 0353  NA 134* 139   < > 133* 137 136  K 3.9 4.0   < > 4.7 3.8 3.9  CL 107 108   < > 101 104 102  CO2 24 24   < > 25 30 29   GLUCOSE 122* 95   < > 120* 87 117*  BUN 12 8   < >  15 10 11   CREATININE 0.72 0.71   < > 0.80 0.71 0.66  CALCIUM 9.3 9.3   < > 8.6* 8.6* 8.8*  PROT 6.6 5.9*  --   --   --   --   ALBUMIN 4.0 3.5  --   --   --   --   AST 39 37  --   --   --   --   ALT 38 37  --   --   --   --   ALKPHOS 88 87  --   --   --   --   BILITOT 0.9 1.0  --   --   --   --   GFRNONAA >60 >60   < > >60 >60 >60  GFRAA >60 >60   < > >60 >60 >60  ANIONGAP 3* 7   < > 7 3* 5   < > = values in this interval not displayed.     Hematology Recent Labs  Lab 06/20/17 1721 06/21/17 0256 06/22/17 0353  WBC 10.6* 8.4 7.0  RBC 4.50 4.14* 4.13*  HGB 14.3 12.8* 12.7*  HCT 42.9 38.8* 38.9*  MCV 95.3 93.7 94.2  MCH 31.8 30.9 30.8  MCHC 33.3 33.0 32.6  RDW 13.7 13.5 13.4  PLT 84* 76* 79*    Cardiac Enzymes Recent Labs  Lab 06/17/17 1718  TROPONINI 1.13*   No results for input(s): TROPIPOC in the last 168 hours.   Lab Results  Component Value Date   CHOL 173 06/17/2017   TRIG 85 06/17/2017   HDL 47 06/17/2017   LDLCALC 109 (H)  06/17/2017    BNPNo results for input(s): BNP, PROBNP in the last 168 hours.   DDimer No results for input(s): DDIMER in the last 168 hours.   Radiology    Dg Chest Port 1 View  Result Date: 06/21/2017 CLINICAL DATA:  Status post coronary artery bypass graft. EXAM: PORTABLE CHEST 1 VIEW COMPARISON:  Radiograph of June 20, 2017. FINDINGS: Stable cardiomediastinal silhouette. Status post coronary artery bypass graft. Right internal jugular Swan-Ganz catheter has been removed. No pneumothorax is noted. Right lung is clear. Elevated left hemidiaphragm is noted with mild left basilar subsegmental atelectasis. Bony thorax is unremarkable. IMPRESSION: Stable elevated left hemidiaphragm is noted with mild left basilar subsegmental atelectasis. Electronically Signed   By: Lupita RaiderJames  Green Jr, M.D.   On: 06/21/2017 08:10    Cardiac Studies   Echo 06/17/17 Study Conclusions  - Left ventricle: Poor image quality possibl inferolateral   hypokinesis on apical views overall EF normal range. The cavity   size was normal. Wall thickness was increased in a pattern of   mild LVH. Systolic function was normal. The estimated ejection   fraction was in the range of 55% to 60%. Wall motion was normal;   there were no regional wall motion abnormalities. - Mitral valve: Calcified annulus. - Atrial septum: No defect or patent foramen ovale was identified.  Patient Profile     61 y.o. male with 3vd  , CAD,  [facility]  Assessment & Plan    ATRIAL FIB: Atrial fib yesterday.  Converted to sinus.  Could be changed to PO amiodarone.    CABG:  Doing well post op.   DYSLIPIDEMIA:  Stop Vytorin and start Crestor 40 mg daily.     HTN:  BP well controlled.    For questions or updates, please contact [facility] Please consult [email or web address] for  contact info under Cardiology/STEMI.   Signed, Rollene Rotunda, MD  06/22/2017, 7:42 AM

## 2017-06-22 NOTE — Discharge Instructions (Signed)
Coronary Artery Bypass Grafting, Care After °This sheet gives you information about how to care for yourself after your procedure. Your health care provider may also give you more specific instructions. If you have problems or questions, contact your health care provider. °What can I expect after the procedure? °After the procedure, it is common to have: °· Nausea and a lack of appetite. °· Constipation. °· Weakness and fatigue. °· Depression or irritability. °· Pain or discomfort in your incision areas. ° °Follow these instructions at home: °Medicines °· Take over-the-counter and prescription medicines only as told by your health care provider. Do not stop taking medicines or start any new medicines without approval from your health care provider. °· If you were prescribed an antibiotic medicine, take it as told by your health care provider. Do not stop taking the antibiotic even if you start to feel better. °· Do not drive or use heavy machinery while taking prescription pain medicine. °Incision care °· Follow instructions from your health care provider about how to take care of your incisions. Make sure you: °? Wash your hands with soap and water before you change your bandage (dressing). If soap and water are not available, use hand sanitizer. °? Change your dressing as told by your health care provider. °? Leave stitches (sutures), skin glue, or adhesive strips in place. These skin closures may need to stay in place for 2 weeks or longer. If adhesive strip edges start to loosen and curl up, you may trim the loose edges. Do not remove adhesive strips completely unless your health care provider tells you to do that. °· Keep incision areas clean, dry, and protected. °· Check your incision areas every day for signs of infection. Check for: °? More redness, swelling, or pain. °? More fluid or blood. °? Warmth. °? Pus or a bad smell. °· If incisions were made in your legs: °? Avoid crossing your legs. °? Avoid  sitting for long periods of time. Change positions every 30 minutes. °? Raise (elevate) your legs when you are sitting. °Bathing °· Do not take baths, swim, or use a hot tub until your health care provider approves. °· Only take sponge baths. Pat the incisions dry. Do not rub incisions with a washcloth or towel. °· Ask your health care provider when you can shower. °Eating and drinking °· Eat foods that are high in fiber, such as raw fruits and vegetables, whole grains, beans, and nuts. Meats should be lean cut. Avoid canned, processed, and fried foods. This can help prevent constipation and is a recommended part of a heart-healthy diet. °· Drink enough fluid to keep your urine clear or pale yellow. °· Limit alcohol intake to no more than 1 drink a day for nonpregnant women and 2 drinks a day for men. One drink equals 12 oz of beer, 5 oz of wine, or 1½ oz of hard liquor. °Activity °· Rest and limit your activity as told by your health care provider. You may be instructed to: °? Stop any activity right away if you have chest pain, shortness of breath, irregular heartbeats, or dizziness. Get help right away if you have any of these symptoms. °? Move around frequently for short periods or take short walks as directed by your health care provider. Gradually increase your activities. You may need physical therapy or cardiac rehabilitation to help strengthen your muscles and build your endurance. °? Avoid lifting, pushing, or pulling anything that is heavier than 10 lb (4.5 kg) for at   least 6 weeks or as told by your health care provider.  Do not drive until your health care provider approves.  Ask your health care provider when you may return to work.  Ask your health care provider when you may resume sexual activity. General instructions  Do not use any products that contain nicotine or tobacco, such as cigarettes and e-cigarettes. If you need help quitting, ask your health care provider.  Take 2-3 deep  breaths every few hours during the day, while you recover. This helps expand your lungs and prevent complications like pneumonia after surgery.  If you were given a device called an incentive spirometer, use it several times a day to practice deep breathing. Support your chest with a pillow or your arms when you take deep breaths or cough.  Wear compression stockings as told by your health care provider. These stockings help to prevent blood clots and reduce swelling in your legs.  Weigh yourself every day. This helps identify if your body is holding (retaining) fluid that may make your heart and lungs work harder.  Keep all follow-up visits as told by your health care provider. This is important. Contact a health care provider if:  You have more redness, swelling, or pain around any incision.  You have more fluid or blood coming from any incision.  Any incision feels warm to the touch.  You have pus or a bad smell coming from any incision  You have a fever.  You have swelling in your ankles or legs.  You have pain in your legs.  You gain 2 lb (0.9 kg) or more a day.  You are nauseous or you vomit.  You have diarrhea. Get help right away if:  You have chest pain that spreads to your jaw or arms.  You are short of breath.  You have a fast or irregular heartbeat.  You notice a "clicking" in your breastbone (sternum) when you move.  You have numbness or weakness in your arms or legs.  You feel dizzy or light-headed. Summary  After the procedure, it is common to have pain or discomfort in the incision areas.  Do not take baths, swim, or use a hot tub until your health care provider approves.  Gradually increase your activities. You may need physical therapy or cardiac rehabilitation to help strengthen your muscles and build your endurance.  Weigh yourself every day. This helps identify if your body is holding (retaining) fluid that may make your heart and lungs work  harder. This information is not intended to replace advice given to you by your health care provider. Make sure you discuss any questions you have with your health care provider. Document Released: 01/03/2005 Document Revised: 05/05/2016 Document Reviewed: 05/05/2016 Elsevier Interactive Patient Education  2018 [employer].  Preventing Type 2 Diabetes Mellitus Type 2 diabetes (type 2 diabetes mellitus) is a long-term (chronic) disease that affects blood sugar (glucose) levels. Normally, a hormone called insulin allows glucose to enter cells in the body. The cells use glucose for energy. In type 2 diabetes, one or both of these problems may be present:  The body does not make enough insulin.  The body does not respond properly to insulin that it makes (insulin resistance).  Insulin resistance or lack of insulin causes excess glucose to build up in the blood instead of going into cells. As a result, high blood glucose (hyperglycemia) develops, which can cause many complications. Being overweight or obese and having an inactive (sedentary) lifestyle  can increase your risk for diabetes. Type 2 diabetes can be delayed or prevented by making certain nutrition and lifestyle changes. What nutrition changes can be made?  Eat healthy meals and snacks regularly. Keep a healthy snack with you for when you get hungry between meals, such as fruit or a handful of nuts.  Eat lean meats and proteins that are low in saturated fats, such as chicken, fish, egg whites, and beans. Avoid processed meats.  Eat plenty of fruits and vegetables and plenty of grains that have not been processed (whole grains). It is recommended that you eat: ? 1?2 cups of fruit every day. ? 2?3 cups of vegetables every day. ? 6?8 oz of whole grains every day, such as oats, whole wheat, bulgur, brown rice, quinoa, and millet.  Eat low-fat dairy products, such as milk, yogurt, and cheese.  Eat foods that contain healthy fats, such  as nuts, avocado, olive oil, and canola oil.  Drink water throughout the day. Avoid drinks that contain added sugar, such as soda or sweet tea.  Follow instructions from your health care provider about specific eating or drinking restrictions.  Control how much food you eat at a time (portion size). ? Check food labels to find out the serving sizes of foods. ? Use a kitchen scale to weigh amounts of foods.  Saute or steam food instead of frying it. Cook with water or broth instead of oils or butter.  Limit your intake of: ? Salt (sodium). Have no more than 1 tsp (2,400 mg) of sodium a day. If you have heart disease or high blood pressure, have less than ? tsp (1,500 mg) of sodium a day. ? Saturated fat. This is fat that is solid at room temperature, such as butter or fat on meat. What lifestyle changes can be made?  Activity  Do moderate-intensity physical activity for at least 30 minutes on at least 5 days of the week, or as much as told by your health care provider.  Ask your health care provider what activities are safe for you. A mix of physical activities may be best, such as walking, swimming, cycling, and strength training.  Try to add physical activity into your day. For example: ? Park in spots that are farther away than usual, so that you walk more. For example, park in a far corner of the parking lot when you go to the office or the grocery store. ? Take a walk during your lunch break. ? Use stairs instead of elevators or escalators. Weight Loss  Lose weight as directed. Your health care provider can determine how much weight loss is best for you and can help you lose weight safely.  If you are overweight or obese, you may be instructed to lose at least 5?7 % of your body weight. Alcohol and Tobacco   Limit alcohol intake to no more than 1 drink a day for nonpregnant women and 2 drinks a day for men. One drink equals 12 oz of beer, 5 oz of wine, or 1 oz of hard  liquor.  Do not use any tobacco products, such as cigarettes, chewing tobacco, and e-cigarettes. If you need help quitting, ask your health care provider. Work With [place] Provider  Have your blood glucose tested regularly, as told by your health care provider.  Discuss your risk factors and how you can reduce your risk for diabetes.  Get screening tests as told by your health care provider. You may have screening  tests regularly, especially if you have certain risk factors for type 2 diabetes.  Make an appointment with a diet and nutrition specialist (registered dietitian). A registered dietitian can help you make a healthy eating plan and can help you understand portion sizes and food labels. Why are these changes important?  It is possible to prevent or delay type 2 diabetes and related health problems by making lifestyle and nutrition changes.  It can be difficult to recognize signs of type 2 diabetes. The best way to avoid possible damage to your body is to take actions to prevent the disease before you develop symptoms. What can happen if changes are not made?  Your blood glucose levels may keep increasing. Having high blood glucose for a long time is dangerous. Too much glucose in your blood can damage your blood vessels, heart, kidneys, nerves, and eyes.  You may develop prediabetes or type 2 diabetes. Type 2 diabetes can lead to many chronic health problems and complications, such as: ? Heart disease. ? Stroke. ? Blindness. ? Kidney disease. ? Depression. ? Poor circulation in the feet and legs, which could lead to surgical removal (amputation) in severe cases. Where to find support:  Ask your health care provider to recommend a registered dietitian, diabetes educator, or weight loss program.  Look for local or online weight loss groups.  Join a gym, fitness club, or outdoor activity group, such as a walking club. Where to find more information: To learn more  about diabetes and diabetes prevention, visit:  American Diabetes Association (ADA): www.diabetes.[employer]  National Institute of Diabetes and Digestive and Kidney Diseases: ToyArticles.cawww.niddk.nih.gov/health-information/diabetes  To learn more about healthy eating, visit:  The U.S. Department of Agriculture Architect(USDA), Choose My Plate: http://yates.biz/www.choosemyplate.gov/food-groups  Office of Disease Prevention and Health Promotion (ODPHP), Dietary Guidelines: ListingMagazine.siwww.health.gov/dietaryguidelines  Summary  You can reduce your risk for type 2 diabetes by increasing your physical activity, eating healthy foods, and losing weight as directed.  Talk with your health care provider about your risk for type 2 diabetes. Ask about any blood tests or screening tests that you need to have. This information is not intended to replace advice given to you by your health care provider. Make sure you discuss any questions you have with your health care provider. Document Released: 10/08/2015 Document Revised: 11/22/2015 Document Reviewed: 08/07/2015 Elsevier Interactive Patient Education  [employer]2018 Elsevier Inc.

## 2017-06-22 NOTE — Progress Notes (Signed)
Patient arrived to floor from 2 heart. Patient is alert and oriented. Patient VS 142./80, HR 67, O2 92 and Resp 19. Patient denies any pain. Patient has no skin issues. Incisions are dry and intact. Patient is resting comfortably in bed.

## 2017-06-22 NOTE — Progress Notes (Signed)
  Amiodarone Drug - Drug Interaction Consult Note  Recommendations: Monitor vital signs. No recommended dose adjustments with amiodarone at this time. Will continue to follow along with patient.   Amiodarone is metabolized by the cytochrome P450 system and therefore has the potential to cause many drug interactions. Amiodarone has an average plasma half-life of 50 days (range 20 to 100 days).   There is potential for drug interactions to occur several weeks or months after stopping treatment and the onset of drug interactions may be slow after initiating amiodarone.   [x]  Statins: Increased risk of myopathy. Simvastatin- restrict dose to 20mg  daily. Other statins: counsel patients to report any muscle pain or weakness immediately.  []  Anticoagulants: Amiodarone can increase anticoagulant effect. Consider warfarin dose reduction. Patients should be monitored closely and the dose of anticoagulant altered accordingly, remembering that amiodarone levels take several weeks to stabilize.  []  Antiepileptics: Amiodarone can increase plasma concentration of phenytoin, the dose should be reduced. Note that small changes in phenytoin dose can result in large changes in levels. Monitor patient and counsel on signs of toxicity.  [x]  Beta blockers: increased risk of bradycardia, AV block and myocardial depression. Sotalol - avoid concomitant use.  []   Calcium channel blockers (diltiazem and verapamil): increased risk of bradycardia, AV block and myocardial depression.  []   Cyclosporine: Amiodarone increases levels of cyclosporine. Reduced dose of cyclosporine is recommended.  []  Digoxin dose should be halved when amiodarone is started.  []  Diuretics: increased risk of cardiotoxicity if hypokalemia occurs.  []  Oral hypoglycemic agents (glyburide, glipizide, glimepiride): increased risk of hypoglycemia. Patient's glucose levels should be monitored closely when initiating amiodarone therapy.   []  Drugs  that prolong the QT interval:  Torsades de pointes risk may be increased with concurrent use - avoid if possible.  Monitor QTc, also keep magnesium/potassium WNL if concurrent therapy can't be avoided. Marland Kitchen. Antibiotics: e.g. fluoroquinolones, erythromycin. . Antiarrhythmics: e.g. quinidine, procainamide, disopyramide, sotalol. . Antipsychotics: e.g. phenothiazines, haloperidol.  . Lithium, tricyclic antidepressants, and methadone.  Thank You,  Girard CooterKimberly Perkins, PharmD Clinical Pharmacist  Pager: [phone] Clinical Phone for 06/22/2017 until 3:30pm: [phone] If after 3:30pm, please call main pharmacy at [phone]

## 2017-06-22 NOTE — Anesthesia Postprocedure Evaluation (Signed)
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    Last Vitals:  Vitals:   06/22/17 0600 06/22/17 0700  BP: 117/70 139/75  Pulse: 63 63  Resp: 19 20  Temp:    SpO2: 96% 97%    Last Pain:  Vitals:   06/22/17 0600  TempSrc:   PainSc: 4                  Beryle Lathehomas E Jocsan Mcginley
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
## 2017-06-22 NOTE — Progress Notes (Addendum)
Patient ID: Curtis Sherman, male   DOB: 02/17/1956, 61 y.o.   MRN: [id number] TCTS DAILY ICU PROGRESS NOTE                   [address]            [place], [id number]          [phone]   3 Days Post-Op Procedure(s) (LRB): CORONARY ARTERY BYPASS GRAFTING (CABG) x 3 WITH RIGHT ENDOVEIN HARVESTING (N/A)  Total Length of Stay:  LOS: 5 days   Subjective: Patient feels better today, holding sinus rhythm in the 60s  Objective: Vital signs in last 24 hours: Temp:  [98 F (36.7 C)-98.7 F (37.1 C)] 98.1 F (36.7 C) (12/24 0739) Pulse Rate:  [47-83] 63 (12/24 0700) Cardiac Rhythm: Atrial fibrillation (12/23 2000) Resp:  [12-22] 20 (12/24 0700) BP: (92-139)/(64-94) 139/75 (12/24 0700) SpO2:  [87 %-100 %] 97 % (12/24 0700) Weight:  [188 lb 0.8 oz (85.3 kg)-191 lb 2.2 oz (86.7 kg)] 188 lb 0.8 oz (85.3 kg) (12/24 0600)  Filed Weights   06/20/17 0400 06/21/17 1600 06/22/17 0600  Weight: 190 lb 11.2 oz (86.5 kg) 191 lb 2.2 oz (86.7 kg) 188 lb 0.8 oz (85.3 kg)    Weight change:    Hemodynamic parameters for last 24 hours:    Intake/Output from previous day: 12/23 0701 - 12/24 0700 In: 2161.4 [P.O.:960; I.V.:951.4; IV Piggyback:250] Out: 1535 [Urine:1535]  Intake/Output this shift: No intake/output data recorded.  Current Meds: Scheduled Meds: . acetaminophen  1,000 mg Oral Q6H   Or  . acetaminophen (TYLENOL) oral liquid 160 mg/5 mL  1,000 mg Per Tube Q6H  . aspirin EC  325 mg Oral Daily   Or  . aspirin  324 mg Per Tube Daily  . bisacodyl  10 mg Oral Daily   Or  . bisacodyl  10 mg Rectal Daily  . Chlorhexidine Gluconate Cloth  6 each Topical Daily  . docusate sodium  200 mg Oral Daily  . enoxaparin (LOVENOX) injection  40 mg Subcutaneous QHS  . ezetimibe-simvastatin  1 tablet Oral Daily  . insulin aspart  0-15 Units Subcutaneous TID WC  . mouth rinse  15 mL Mouth Rinse BID  . metoprolol tartrate  25 mg Oral BID  . pantoprazole  40 mg Oral Daily    . sodium chloride flush  10-40 mL Intracatheter Q12H  . sodium chloride flush  3 mL Intravenous Q12H   Continuous Infusions: . sodium chloride Stopped (06/20/17 1027)  . sodium chloride    . sodium chloride Stopped (06/20/17 0400)  . amiodarone 30 mg/hr (06/22/17 0700)  . dexmedetomidine (PRECEDEX) IV infusion Stopped (06/20/17 0514)  . lactated ringers    . lactated ringers Stopped (06/20/17 0400)  . lactated ringers 20 mL/hr at 06/22/17 0700  . nitroGLYCERIN Stopped (06/19/17 1758)  . phenylephrine (NEO-SYNEPHRINE) Adult infusion Stopped (06/20/17 1026)   PRN Meds:.sodium chloride, cyclobenzaprine, ketorolac, lactated ringers, metoprolol tartrate, midazolam, morphine injection, ondansetron (ZOFRAN) IV, oxyCODONE, sodium chloride flush, sodium chloride flush, traMADol  General appearance: alert, cooperative and no distress Neurologic: intact Heart: regular rate and rhythm, S1, S2 normal, no murmur, click, rub or gallop Lungs: diminished breath sounds bibasilar Abdomen: soft, non-tender; bowel sounds normal; no masses,  no organomegaly Extremities: extremities normal, atraumatic, no cyanosis or edema and Homans sign is negative, no sign of DVT Wound: Sternum stable Aquacel in place  Lab Results: CBC: Recent Labs    06/21/17 0256  06/22/17 0353  WBC 8.4 7.0  HGB 12.8* 12.7*  HCT 38.8* 38.9*  PLT 76* 79*   BMET:  Recent Labs    06/21/17 0256 06/22/17 0353  NA 137 136  K 3.8 3.9  CL 104 102  CO2 30 29  GLUCOSE 87 117*  BUN 10 11  CREATININE 0.71 0.66  CALCIUM 8.6* 8.8*    CMET: Lab Results  Component Value Date   WBC 7.0 06/22/2017   HGB 12.7 (L) 06/22/2017   HCT 38.9 (L) 06/22/2017   PLT 79 (L) 06/22/2017   GLUCOSE 117 (H) 06/22/2017   CHOL 173 06/17/2017   TRIG 85 06/17/2017   HDL 47 06/17/2017   LDLCALC 109 (H) 06/17/2017   ALT 37 06/17/2017   AST 37 06/17/2017   NA 136 06/22/2017   K 3.9 06/22/2017   CL 102 06/22/2017   CREATININE 0.66 06/22/2017    BUN 11 06/22/2017   CO2 29 06/22/2017   INR 1.36 06/19/2017   HGBA1C 6.1 (H) 06/17/2017      PT/INR:  Recent Labs    06/19/17 1606  LABPROT 16.6*  INR 1.36   Radiology: Dg Chest Port 1 View  Result Date: 06/22/2017 CLINICAL DATA:  Chest pain EXAM: PORTABLE CHEST 1 VIEW COMPARISON:  June 21, 2017 FINDINGS: Cordis tip is in the superior vena cava. No pneumothorax. There is atelectatic change in the left base. The lungs elsewhere are clear. There is mild cardiomegaly with pulmonary vascularity within normal limits. Patient is status post coronary artery bypass grafting. No adenopathy. No evident bone lesions. IMPRESSION: No pneumothorax. Left base atelectasis. Lungs elsewhere clear. Stable cardiac silhouette peer Electronically Signed   By: Bretta BangWilliam  Woodruff III M.D.   On: 06/22/2017 07:45     Assessment/Plan: S/P Procedure(s) (LRB): CORONARY ARTERY BYPASS GRAFTING (CABG) x 3 WITH RIGHT ENDOVEIN HARVESTING (N/A) Mobilize Diuresis Plan for transfer to step-down: see transfer orders Expected Acute  Blood - loss Anemia- continue to monitor  Potassium replaced Mild thrombocytopenia, monitor carefully since on Lovenox DVT prophylaxis  with presentation of acute STEMI continue with Plavix and aspirin  [name] 06/22/2017 7:55 AM

## 2017-06-22 NOTE — Plan of Care (Signed)
Pt tolerating advancement of diet to Carb mod/Cardiac diet, consuming adequate % per meal.   Pt receiving ordered stool softeners with BM on POD 3.  Pt had a period post-op of Afib that was refractory to Lopressor PRN. Pt converted to NSR after Amiodarone protocol was implemented. Pt maintained good BP during these episodes with MAP > 65.

## 2017-06-23 ENCOUNTER — Inpatient Hospital Stay (HOSPITAL_COMMUNITY): Payer: Medicare HMO

## 2017-06-23 LAB — BASIC METABOLIC PANEL
Anion gap: 7 (ref 5–15)
BUN: 12 mg/dL (ref 6–20)
CO2: 30 mmol/L (ref 22–32)
Calcium: 8.9 mg/dL (ref 8.9–10.3)
Chloride: 98 mmol/L — ABNORMAL LOW (ref 101–111)
Creatinine, Ser: 0.77 mg/dL (ref 0.61–1.24)
GFR calc Af Amer: >60 mL/min (ref >60)
GFR calc non Af Amer: >60 mL/min (ref >60)
Glucose, Bld: 106 mg/dL — ABNORMAL HIGH (ref 65–99)
Potassium: 4 mmol/L (ref 3.5–5.1)
Sodium: 135 mmol/L (ref 135–145)

## 2017-06-23 LAB — CBC
HCT: 39 % (ref 39.0–52.0)
Hemoglobin: 13 g/dL (ref 13.0–17.0)
MCH: 31 pg (ref 26.0–34.0)
MCHC: 33.3 g/dL (ref 30.0–36.0)
MCV: 93.1 fL (ref 78.0–100.0)
Platelets: 110 10*3/uL — ABNORMAL LOW (ref 150–400)
RBC: 4.19 MIL/uL — ABNORMAL LOW (ref 4.22–5.81)
RDW: 13.3 % (ref 11.5–15.5)
WBC: 5.6 10*3/uL (ref 4.0–10.5)

## 2017-06-23 LAB — TSH: TSH: 10.283 u[IU]/mL — ABNORMAL HIGH (ref 0.350–4.500)

## 2017-06-23 LAB — HEPARIN INDUCED PLATELET AB (HIT ANTIBODY): HEPARIN INDUCED PLT AB: 0.113 {OD_unit} (ref 0.000–0.400)

## 2017-06-23 LAB — T4, FREE: Free T4: 0.97 ng/dL (ref 0.61–1.12)

## 2017-06-23 LAB — MAGNESIUM: Magnesium: 1.7 mg/dL (ref 1.7–2.4)

## 2017-06-23 LAB — GLUCOSE, CAPILLARY: GLUCOSE-CAPILLARY: 91 mg/dL (ref 65–99)

## 2017-06-23 NOTE — Progress Notes (Signed)
Pt ambulated 470 ft in hallway on room air. Pt's oxygen saturation remained 89-92%. Pt tolerated well.    Berdine DanceLauren Moffitt BSN, RN

## 2017-06-23 NOTE — Progress Notes (Signed)
Epicardial pacing wires removed, per order. Pt tolerated well. Wire ends intact upon removal. Sites clean and dry. Vitals being monitored per protocol. Bedrest x1 hour. Will continue to monitor.    Berdine DanceLauren Moffitt BSN, RN

## 2017-06-23 NOTE — Progress Notes (Addendum)
      [address]       [place] [id number]             [phone]      4 Days Post-Op Procedure(s) (LRB): CORONARY ARTERY BYPASS GRAFTING (CABG) x 3 WITH RIGHT ENDOVEIN HARVESTING (N/A) Subjective: No issues overnight. Asking when he can go home.   Objective: Vital signs in last 24 hours: Temp:  [98 F (36.7 C)-98.6 F (37 C)] 98.3 F (36.8 C) (12/25 0357) Pulse Rate:  [50-75] 61 (12/25 0358) Cardiac Rhythm: Normal sinus rhythm (12/25 0701) Resp:  [12-21] 16 (12/25 0358) BP: (110-152)/(70-86) 112/73 (12/25 0358) SpO2:  [90 %-98 %] 95 % (12/25 0358)     Intake/Output from previous day: 12/24 0701 - 12/25 0700 In: 306.8 [P.O.:240; I.V.:66.8] Out: 1205 [Urine:1205] Intake/Output this shift: No intake/output data recorded.  General appearance: alert, cooperative and no distress Heart: regular rate and rhythm, S1, S2 normal, no murmur, click, rub or gallop Lungs: clear to auscultation bilaterally Abdomen: soft, non-tender; bowel sounds normal; no masses,  no organomegaly Extremities: extremities normal, atraumatic, no cyanosis or edema Wound: clean and dry  Lab Results: Recent Labs    06/22/17 0353 06/23/17 0324  WBC 7.0 5.6  HGB 12.7* 13.0  HCT 38.9* 39.0  PLT 79* 110*   BMET:  Recent Labs    06/22/17 0353 06/23/17 0324  NA 136 135  K 3.9 4.0  CL 102 98*  CO2 29 30  GLUCOSE 117* 106*  BUN 11 12  CREATININE 0.66 0.77  CALCIUM 8.8* 8.9    PT/INR: No results for input(s): LABPROT, INR in the last 72 hours. ABG    Component Value Date/Time   PHART 7.307 (L) 06/19/2017 2039   HCO3 22.3 06/19/2017 2039   TCO2 24 06/19/2017 2155   ACIDBASEDEF 4.0 (H) 06/19/2017 2039   O2SAT 97.0 06/19/2017 2039   CBG (last 3)  Recent Labs    06/22/17 0735 06/22/17 1120 06/23/17 0644  GLUCAP 94 124* 91    Assessment/Plan: S/P Procedure(s) (LRB): CORONARY ARTERY BYPASS GRAFTING (CABG) x 3 WITH RIGHT ENDOVEIN HARVESTING (N/A)  1. CV-SB to  NSR in the 70s. BP well controlled on lopressor 12.5mg . Continue Amio 200mg  daily, ASA, Plavix, and Crestor.  2. Pulm-tolerating 2L [place] with good oxygen saturation. Encourage incentive spirometer use. CXR shows lowl ung volumes with bibasilar atelectasis or infiltrates, L > R. Mild elevation of the left hemidiaphragm.  3. Renal-creatinine 0.77, electrolytes okay.  4. H and H stable, platelets trending up today they are 110k.  5. Endo-blood glucose well controlled. TSH elevated, will order free T3 and T4 to further evaluate.   Plan:continue to wean supplemental oxygen as tolerated. Will order 6 minute walk test to evaluate for home oxygen needs. Remove EPW. Possibly home tomorrow if he continues to progress.     LOS: 6 days    Sharlene Doryessa N Conte 06/23/2017  Looks good for discharge home tomorrow patient examined and medical record reviewed,agree with above note. Kathlee Nationseter Van Trigt III 06/23/2017

## 2017-06-24 DIAGNOSIS — I213 ST elevation (STEMI) myocardial infarction of unspecified site: Principal | ICD-10-CM

## 2017-06-24 LAB — BASIC METABOLIC PANEL
Anion gap: 8 (ref 5–15)
BUN: 13 mg/dL (ref 6–20)
CO2: 27 mmol/L (ref 22–32)
Calcium: 9.3 mg/dL (ref 8.9–10.3)
Chloride: 101 mmol/L (ref 101–111)
Creatinine, Ser: 0.73 mg/dL (ref 0.61–1.24)
GFR calc Af Amer: >60 mL/min (ref >60)
GFR calc non Af Amer: >60 mL/min (ref >60)
Glucose, Bld: 114 mg/dL — ABNORMAL HIGH (ref 65–99)
Potassium: 3.4 mmol/L — ABNORMAL LOW (ref 3.5–5.1)
Sodium: 136 mmol/L (ref 135–145)

## 2017-06-24 LAB — T3, FREE: T3, Free: 2.4 pg/mL (ref 2.0–4.4)

## 2017-06-24 LAB — GLUCOSE, CAPILLARY: Glucose-Capillary: 177 mg/dL — ABNORMAL HIGH (ref 65–99)

## 2017-06-24 MED ORDER — ASPIRIN 81 MG PO TBEC
81.0000 mg | DELAYED_RELEASE_TABLET | Freq: Every day | ORAL | Status: DC
Start: 2017-06-24 — End: 2023-01-22

## 2017-06-24 MED ORDER — AMIODARONE HCL 200 MG PO TABS: 200.0000 mg | ORAL_TABLET | Freq: Two times a day (BID) | ORAL | 1 refills | Status: DC

## 2017-06-24 MED ORDER — FUROSEMIDE 40 MG PO TABS
40.0000 mg | ORAL_TABLET | Freq: Every day | ORAL | Status: DC
Start: 2017-06-24 — End: 2017-06-24
  Administered 2017-06-24: 40 mg via ORAL
  Filled 2017-06-24: qty 1

## 2017-06-24 MED ORDER — ONDANSETRON HCL 4 MG PO TABS: 4.0000 mg | ORAL_TABLET | Freq: Four times a day (QID) | ORAL | 0 refills | Status: DC | PRN

## 2017-06-24 MED ORDER — OXYCODONE HCL 5 MG PO TABS: ORAL_TABLET | ORAL | 0 refills | Status: DC

## 2017-06-24 MED ORDER — POTASSIUM CHLORIDE CRYS ER 20 MEQ PO TBCR: 30.0000 meq | EXTENDED_RELEASE_TABLET | Freq: Every day | ORAL | Status: DC

## 2017-06-24 MED ORDER — ACETAMINOPHEN 325 MG PO TABS
650.0000 mg | ORAL_TABLET | Freq: Four times a day (QID) | ORAL | Status: AC | PRN
Start: 2017-06-24 — End: ?

## 2017-06-24 MED ORDER — ROSUVASTATIN CALCIUM 40 MG PO TABS: 40.0000 mg | ORAL_TABLET | Freq: Every day | ORAL | 1 refills | Status: DC

## 2017-06-24 MED ORDER — METOPROLOL TARTRATE 25 MG PO TABS: 25.0000 mg | ORAL_TABLET | Freq: Two times a day (BID) | ORAL | 1 refills | Status: DC

## 2017-06-24 MED ORDER — CLOPIDOGREL BISULFATE 75 MG PO TABS: 75.0000 mg | ORAL_TABLET | Freq: Every day | ORAL | 1 refills | Status: DC

## 2017-06-24 MED ORDER — POTASSIUM CHLORIDE CRYS ER 20 MEQ PO TBCR
40.0000 meq | EXTENDED_RELEASE_TABLET | Freq: Once | ORAL | Status: AC
  Administered 2017-06-24: 40 meq via ORAL
  Filled 2017-06-24: qty 2

## 2017-06-24 NOTE — Progress Notes (Signed)
Progress Note  Patient Name: Curtis Sherman Date of Encounter: 06/24/2017  Primary Cardiologist:   No primary care provider on file.   Subjective   No chest pain.  Breathing OK.     Inpatient Medications    Scheduled Meds: . amiodarone  200 mg Oral Q12H   Followed by  . [START ON 06/29/2017] amiodarone  200 mg Oral Daily  . aspirin EC  81 mg Oral Daily  . Chlorhexidine Gluconate Cloth  6 each Topical Daily  . clopidogrel  75 mg Oral Daily  . enoxaparin (LOVENOX) injection  30 mg Subcutaneous QHS  . furosemide  40 mg Oral Daily  . mouth rinse  15 mL Mouth Rinse BID  . metoprolol tartrate  12.5 mg Oral BID  . pantoprazole  40 mg Oral QAC breakfast  . potassium chloride  30 mEq Oral Daily  . potassium chloride  40 mEq Oral Once  . rosuvastatin  40 mg Oral q1800  . sodium chloride flush  3 mL Intravenous Q12H   Continuous Infusions: . sodium chloride     PRN Meds: sodium chloride, acetaminophen, bisacodyl **OR** bisacodyl, cyclobenzaprine, guaiFENesin, ondansetron **OR** ondansetron (ZOFRAN) IV, oxyCODONE, sodium chloride flush, traMADol   Vital Signs    Vitals:   06/23/17 2019 06/23/17 2349 06/23/17 2350 06/24/17 0501  BP: 120/69  129/66 125/69  Pulse: 66  62 63  Resp: 13  13 (!) 21  Temp: 98.7 F (37.1 C) 98.8 F (37.1 C) 98.8 F (37.1 C) 98.1 F (36.7 C)  TempSrc: Oral  Oral Oral  SpO2: 93%  92% 91%  Weight:    187 lb 3.2 oz (84.9 kg)  Height:        Intake/Output Summary (Last 24 hours) at 06/24/2017 0751 Last data filed at 06/23/2017 1300 Gross per 24 hour  Intake 480 ml  Output -  Net 480 ml   Filed Weights   06/22/17 0600 06/23/17 0930 06/24/17 0501  Weight: 188 lb 0.8 oz (85.3 kg) 186 lb 8 oz (84.6 kg) 187 lb 3.2 oz (84.9 kg)    Telemetry    NSR - Personally Reviewed  ECG    NA - Personally Reviewed  Physical Exam   GEN: No  acute distress.   Neck: No  JVD Cardiac: RRR, no murmurs, rubs, or gallops.  Respiratory: Clear   to  auscultation bilaterally. GI: Soft, nontender, non-distended, normal bowel sounds  MS:  No edema; No deformity. Neuro:   Nonfocal  Psych: Oriented and appropriate    Labs    Chemistry Recent Labs  Lab 06/17/17 1718 06/17/17 2214  06/22/17 0353 06/23/17 0324 06/24/17 0307  NA 134* 139   < > 136 135 136  K 3.9 4.0   < > 3.9 4.0 3.4*  CL 107 108   < > 102 98* 101  CO2 24 24   < > 29 30 27   GLUCOSE 122* 95   < > 117* 106* 114*  BUN 12 8   < > 11 12 13   CREATININE 0.72 0.71   < > 0.66 0.77 0.73  CALCIUM 9.3 9.3   < > 8.8* 8.9 9.3  PROT 6.6 5.9*  --   --   --   --   ALBUMIN 4.0 3.5  --   --   --   --   AST 39 37  --   --   --   --   ALT 38 37  --   --   --   --  ALKPHOS 88 87  --   --   --   --   BILITOT 0.9 1.0  --   --   --   --   GFRNONAA >60 >60   < > >60 >60 >60  GFRAA >60 >60   < > >60 >60 >60  ANIONGAP 3* 7   < > 5 7 8    < > = values in this interval not displayed.     Hematology Recent Labs  Lab 06/21/17 0256 06/22/17 0353 06/23/17 0324  WBC 8.4 7.0 5.6  RBC 4.14* 4.13* 4.19*  HGB 12.8* 12.7* 13.0  HCT 38.8* 38.9* 39.0  MCV 93.7 94.2 93.1  MCH 30.9 30.8 31.0  MCHC 33.0 32.6 33.3  RDW 13.5 13.4 13.3  PLT 76* 79* 110*    Cardiac Enzymes Recent Labs  Lab 06/17/17 1718  TROPONINI 1.13*   No results for input(s): TROPIPOC in the last 168 hours.   Lab Results  Component Value Date   CHOL 173 06/17/2017   TRIG 85 06/17/2017   HDL 47 06/17/2017   LDLCALC 109 (H) 06/17/2017    BNPNo results for input(s): BNP, PROBNP in the last 168 hours.   DDimer No results for input(s): DDIMER in the last 168 hours.   Radiology    Dg Chest 2 View  Result Date: 06/23/2017 CLINICAL DATA:  Postop EXAM: CHEST  2 VIEW COMPARISON:  06/22/2017 FINDINGS: Mild cardiomegaly. Prior CABG. Bibasilar atelectasis or infiltrates, left greater than right. Low lung volumes with mild elevation of the left hemidiaphragm. IMPRESSION: Low volumes, bibasilar atelectasis or  infiltrates, left greater than right. No real change. Electronically Signed   By: Charlett NoseKevin  Dover M.D.   On: 06/23/2017 07:18    Cardiac Studies   Echo 06/17/17 Study Conclusions  - Left ventricle: Poor image quality possibl inferolateral   hypokinesis on apical views overall EF normal range. The cavity   size was normal. Wall thickness was increased in a pattern of   mild LVH. Systolic function was normal. The estimated ejection   fraction was in the range of 55% to 60%. Wall motion was normal;   there were no regional wall motion abnormalities. - Mitral valve: Calcified annulus. - Atrial septum: No defect or patent foramen ovale was identified.  Patient Profile     61 y.o. male with 3vd  , CAD,  S/P EMERGENT  [facility]ATH [facility]  Assessment & Plan    ATRIAL FIB: Maintaining NSR on PO amio.  Amiodarone can be stopped in the weeks to come when we see him in follow up.    CABG:  Doing well post op.  He is on ASA and Plavix.  I think this is OK but would not continue DAPT beyond three months.    DYSLIPIDEMIA:  Started Crestor 40 mg daily.     HTN:  BP is well controlled.    We will arrange follow up with one of our MDs in the [place] office. .    For questions or updates, please contact [facility] Please consult [email or web address] for contact info under Cardiology/STEMI.   Signed, Rollene RotundaJames Sonji Starkes, MD  06/24/2017, 7:51 AM

## 2017-06-24 NOTE — Care Management Note (Signed)
Case Management Note  Patient Details  Name: Curtis Sherman MRN: [id number] Date of Birth: 05/19/1956  Subjective/Objective:     Pt admitted with STEMI and s/p CABG. He is from home with his spouse.           Action/Plan: Pt discharging home with self care. Pt has PCP, hospital f/u, insurance and transportation home. No further needs per CM.    Expected Discharge Date:  06/24/17               Expected Discharge Plan:  Home/Self Care  In-House Referral:     Discharge planning Services  CM Consult  Post Acute Care Choice:    Choice offered to:     DME Arranged:    DME Agency:     HH Arranged:    HH Agency:     Status of Service:  Completed, signed off  If discussed at [employer] Length of Stay Meetings, dates discussed:    Additional Comments:  Kermit BaloKelli F Zekiah Coen, RN 06/24/2017, 11:10 AM

## 2017-06-24 NOTE — Progress Notes (Addendum)
      [address]       [place] [id number]             [phone]        5 Days Post-Op Procedure(s) (LRB): CORONARY ARTERY BYPASS GRAFTING (CABG) x 3 WITH RIGHT ENDOVEIN HARVESTING (N/A)  Subjective: Patient really wants to go home. He has no specific complaints. He is a little emotional this am about this "experience".  Objective: Vital signs in last 24 hours: Temp:  [98.1 F (36.7 C)-98.8 F (37.1 C)] 98.1 F (36.7 C) (12/26 0501) Pulse Rate:  [62-78] 63 (12/26 0501) Cardiac Rhythm: Normal sinus rhythm (12/25 1900) Resp:  [13-21] 21 (12/26 0501) BP: (112-129)/(65-69) 125/69 (12/26 0501) SpO2:  [88 %-96 %] 91 % (12/26 0501) Weight:  [186 lb 8 oz (84.6 kg)-187 lb 3.2 oz (84.9 kg)] 187 lb 3.2 oz (84.9 kg) (12/26 0501)  Pre op weight 83.3 kg Current Weight  06/24/17 187 lb 3.2 oz (84.9 kg)      Intake/Output from previous day: 12/25 0701 - 12/26 0700 In: 480 [P.O.:480] Out: -    Physical Exam:  Cardiovascular: RRR Pulmonary: Slightly diminished at bases Abdomen: Soft, non tender, bowel sounds present. Extremities: Trace bilateral lower extremity edema. Wounds: Clean and dry.  No erythema or signs of infection.  Lab Results: CBC: Recent Labs    06/22/17 0353 06/23/17 0324  WBC 7.0 5.6  HGB 12.7* 13.0  HCT 38.9* 39.0  PLT 79* 110*   BMET:  Recent Labs    06/23/17 0324 06/24/17 0307  NA 135 136  K 4.0 3.4*  CL 98* 101  CO2 30 27  GLUCOSE 106* 114*  BUN 12 13  CREATININE 0.77 0.73  CALCIUM 8.9 9.3    PT/INR:  Lab Results  Component Value Date   INR 1.36 06/19/2017   INR 1.12 06/17/2017   INR 1.01 06/17/2017   ABG:  INR: Will add last result for INR, ABG once components are confirmed Will add last 4 CBG results once components are confirmed  Assessment/Plan:  1. CV - Previous a fib. Maintaining SR. On Amiodarone 200 mg bid, Lopressor 12.5 mg bid, Plavix 75 mg daily. 2.  Pulmonary - On room air. Encourage incentive  spirometer. 3. Volume Overload - Lasix 40 mg daily 4.  Acute blood loss anemia - Last H and H stable at 13 and 39 5. Thrombocytopenia-platelets up to 110,000 6. Supplement potassium 7. Discharge  Curtis M ZimmermanPA-C 06/24/2017,7:15 AM  Plan home today , [facility] I have seen and examined Curtis Sherman and agree with the above assessment  and plan.  Delight OvensEdward B Theo Krumholz MD Beeper [phone] Office [phone] 06/24/2017 9:19 AM

## 2017-06-24 NOTE — Op Note (Signed)
NAME:  Curtis Sherman, Curtis Sherman                    ACCOUNT NO.:  MEDICAL RECORD NO.:  [id number]  LOCATION:                                 FACILITY:  PHYSICIAN:  [name] Curtis Zaffino, MD    DATE OF BIRTH:  Aug 29, 1955  DATE OF PROCEDURE:  06/19/2017 DATE OF DISCHARGE:                              OPERATIVE REPORT   POSTOPERATIVE DIAGNOSIS:  Recent myocardial infarction less than 48 hours with 3-vessel coronary artery disease.  POSTOPERATIVE DIAGNOSIS:  Recent myocardial infarction less than 48 hours with 3-vessel coronary artery disease.  SURGICAL PROCEDURES:  Coronary artery bypass grafting x4 with the left internal mammary to the left anterior descending coronary artery, reversed saphenous vein graft to the diagonal coronary artery, reversed saphenous vein graft to the first obtuse marginal coronary artery, reversed saphenous vein graft to the distal right coronary artery with right greater saphenous thigh and calf endoscopic vein harvesting.  SURGEON:  [name] Curtis Laday, MD.  FIRST ASSISTANT:  Rowe ClackWayne E. Gold, PA-C.  BRIEF HISTORY:  The patient is a 61 year old male who presented on December 19 to [facility] with new onset of arm and chest pain.  He was seen by Dr. Darrold JunkerParaschos and underwent urgent cardiac catheterization which demonstrated complex high-grade greater than 90% stenosis of the right coronary artery with faint collateral filling of the distal vessel.  In addition, he had complex proximal LAD disease involving the diagonal and total occlusion of the circumflex with faint ghosting of an obtuse marginal vessel.  Overall, ventricular function appeared preserved.  The patient was heparinized and while at [facility], became pain free acutely.  We reviewed the films with Cardiology and with his 3-vessel disease felt most appropriate to proceed with coronary artery bypass grafting rather than trying to stent the right coronary artery.  The [facility].  He  remained hemodynamically stable and was without chest pain, on heparin.  Risks and options of surgery were discussed with the patient in detail and he was willing to proceed.  The patient has had a Nissen fundoplication, so we did not do a TEE during surgery.  He is a long-term smoker, for the past several months has been trying to decrease his smoking.  The patient agreed to proceeding with surgery and signed informed consent.  DESCRIPTION OF PROCEDURE:  With Swan-Ganz and arterial line monitors in place, the patient underwent general endotracheal anesthesia without incident.  Skin of the chest and legs were prepped with Betadine and draped in the usual sterile manner.  Using the endoscopic vein harvesting system, the right greater saphenous vein from the thigh and calf was removed and was of good quality.  Median sternotomy was performed.  The left internal mammary artery was dissected down as a pedicle graft.  The distal artery was divided, had good free flow. Pericardium was opened.  Overall ventricular function appeared preserved.  The patient was systemically heparinized.  Ascending aorta was cannulated.  The right atrium was cannulated.  An aortic root vent cardioplegia needle was introduced into the ascending aorta.  The patient was placed on cardiopulmonary bypass at 2.4 L/min/m2.  Sites of anastomosis were selected and dissected out  of the epicardium.  The patient's posterior descending coronary artery was relatively small, but the distal right coronary artery was suitable for bypass and was without significant thickening or calcification.  The patient's body temperature was cooled to 32 degrees.  Aortic crossclamp was applied and 500 mL of cold blood potassium cardioplegia was administered with diastolic arrest of the heart.  Myocardial septal temperature was monitored throughout the crossclamp.  We turned our attention first to the distal right coronary artery, which was  opened, admitted a 1.5-mm probe.  Using a running 7-0 Prolene, distal anastomosis was performed.  The heart was then elevated and the obtuse marginal which was totally occluded was identified and larger than evident on the film as it was underfilled. The vessel was opened, admitted a 1.5-mm probe distally.  It was partially intramyocardial.  Using a running 7-0 Prolene, distal anastomosis was performed with a segment of reversed saphenous vein graft.  Attention was then turned to a small first diagonal, which was opened and was approximately 1.2 mm in size.  Using a running 7-0 Prolene, distal anastomosis was performed.  Additional cold blood cardioplegia was administered down the vein grafts.  Attention was then turned to the left anterior descending coronary artery.  Between the distal and mid third of the vessel, the LAD was opened and admitted a 1.5-mm probe proximally and distally.  Using a running 8-0 Prolene, left internal mammary artery was anastomosed to the left anterior descending coronary artery.  With the crossclamp still in place, 3 punch aortotomies were performed and each of 3 vein grafts was anastomosed to the ascending aorta.  The bulldog was then removed from the mammary artery with prompt rise in myocardial septal temperature.  The heart was allowed to passively fill and de-air.  Proximal anastomoses were completed and aortic crossclamp was removed with a total cross-clamp time of 108 minutes.  Sites of anastomosis were inspected and were free of bleeding.  The patient was then ventilated and weaned from cardiopulmonary bypass without difficulty.  He remained hemodynamically stable without pressors.  He was decannulated in usual fashion. Protamine sulfate was administered with operative field hemostatic. Atrial and ventricular pacing wires were applied.  Graft marker was applied.  Left pleural tube and Blake mediastinal drain were left in place.  Pericardium was  loosely reapproximated.  Sternum was closed with #6 stainless steel wire.  Fascia was closed with interrupted 0 Vicryl, running 3-0 Vicryl in subcutaneous tissue, 4-0 subcuticular stitch in skin edges.  Dry dressings were applied.  Sponge and needle count was reported as correct at the completion of procedure.  The patient did not require any blood bank blood products during the operative procedure. Total pump time was 139 minutes.  He was transferred to Surgical Intensive Care Unit for further postoperative care.     [name] Corley Maffeo, MD     EG/MEDQ  D:  06/21/2017  T:  06/21/2017  Job:  [id number]  cc:   Marcina MillardAlexander Paraschos, MD

## 2017-06-24 NOTE — Progress Notes (Signed)
Pt has been moving independently, walking. Ed completed with pt with good reception. Will refer to [place] [facility]II. Pt plans to quit smoking.  [id number] Ethelda ChickKristan Chaska Hagger CES, ACSM 9:21 AM 06/24/2017

## 2017-06-25 MED FILL — Mannitol IV Soln 20%: INTRAVENOUS | Qty: 500 | Status: AC

## 2017-06-25 MED FILL — Heparin Sodium (Porcine) Inj 1000 Unit/ML: INTRAMUSCULAR | Qty: 10 | Status: AC

## 2017-06-25 MED FILL — Electrolyte-R (PH 7.4) Solution: INTRAVENOUS | Qty: 3000 | Status: AC

## 2017-06-25 MED FILL — Sodium Bicarbonate IV Soln 8.4%: INTRAVENOUS | Qty: 50 | Status: AC

## 2017-06-25 MED FILL — Lidocaine HCl IV Inj 20 MG/ML: INTRAVENOUS | Qty: 5 | Status: AC

## 2017-06-25 MED FILL — Sodium Chloride IV Soln 0.9%: INTRAVENOUS | Qty: 2000 | Status: AC

## 2017-07-09 ENCOUNTER — Ambulatory Visit (INDEPENDENT_AMBULATORY_CARE_PROVIDER_SITE_OTHER): Payer: Medicare HMO | Admitting: Cardiovascular Disease

## 2017-07-09 ENCOUNTER — Encounter: Payer: Self-pay | Admitting: Cardiovascular Disease

## 2017-07-09 VITALS — BP 142/62 | HR 40 | Ht 73.0 in | Wt 180.0 lb

## 2017-07-09 DIAGNOSIS — I2510 Atherosclerotic heart disease of native coronary artery without angina pectoris: Secondary | ICD-10-CM

## 2017-07-13 ENCOUNTER — Telehealth: Payer: Self-pay

## 2017-07-13 NOTE — Progress Notes (Signed)
This appointment was made by mistake as the patient sees Dr. Darrold JunkerParaschos.  Appointment was canceled. No charge.

## 2017-07-13 NOTE — Telephone Encounter (Signed)
Patient's wife called asking if Mr. Curtis Sherman could drive and when he would start Cardiac rehab.  Wife was informed that he could dive three weeks post op as long as he was not taking any narcotics and driving short distances.  She was told that if he were to go on long car rides, he needed to get out of the car ever so often and walk to prevent formation of DVT.  Patient should continue to use the incentive spirometer as prescribed and continue to walk around the house. Cardiac Rehab would be discussed at his next follow-up appointment with Dr. Tyrone SageGerhardt.  Wife acknowledged receipt.  All questions and concerns addressed.

## 2017-07-14 ENCOUNTER — Ambulatory Visit: Payer: Medicare HMO | Admitting: Cardiovascular Disease

## 2017-07-23 ENCOUNTER — Telehealth: Payer: Self-pay

## 2017-07-23 NOTE — Telephone Encounter (Signed)
Mr. Curtis Sherman's wife called this morning about concerns of him being very fatigued for the last 3 days.  His blood pressure this morning was 177/82 and heart rate was 43.  I expressed the need to speak with Cardiology because they would be the ones to handle this issue, and she stated that she has called, and waiting for a call back.  I explained to her that his feeling of fatigue could likely be due to his low heart rate and/or because of medication, especially if he has not experienced it that low before.  The blood pressure was taken before medications this morning and told her to recheck his BP to see if it has improved since.  Wife acknowledged receipt, and was waiting for Cardiology call-back.

## 2017-07-29 ENCOUNTER — Other Ambulatory Visit: Payer: Self-pay | Admitting: Physician Assistant

## 2017-07-29 ENCOUNTER — Other Ambulatory Visit: Payer: Self-pay | Admitting: *Deleted

## 2017-07-29 DIAGNOSIS — Z951 Presence of aortocoronary bypass graft: Secondary | ICD-10-CM

## 2017-07-30 ENCOUNTER — Ambulatory Visit (INDEPENDENT_AMBULATORY_CARE_PROVIDER_SITE_OTHER): Payer: Self-pay | Admitting: Cardiothoracic Surgery

## 2017-07-30 ENCOUNTER — Other Ambulatory Visit: Payer: Self-pay

## 2017-07-30 ENCOUNTER — Encounter: Payer: Self-pay | Admitting: Cardiothoracic Surgery

## 2017-07-30 ENCOUNTER — Ambulatory Visit
Admission: RE | Admit: 2017-07-30 | Discharge: 2017-07-30 | Disposition: A | Discharge status: Home / Self Care | Payer: Medicare HMO | Source: Ambulatory Visit | Attending: Cardiothoracic Surgery | Admitting: Cardiothoracic Surgery

## 2017-07-30 VITALS — BP 154/80 | HR 46 | Resp 18 | Ht 73.0 in | Wt 188.4 lb

## 2017-07-30 DIAGNOSIS — Z951 Presence of aortocoronary bypass graft: Secondary | ICD-10-CM

## 2017-07-30 DIAGNOSIS — Z09 Encounter for follow-up examination after completed treatment for conditions other than malignant neoplasm: Secondary | ICD-10-CM

## 2017-07-30 NOTE — Progress Notes (Signed)
[address]       [place] [address]             [phone]      Burtis Imhoff [facility] Medical Record #[id number] Date of Birth: [birth year]  Referring: Marcina Millard, MD Primary Care: Marina Goodell, MD Primary Cardiologist: No primary care provider on file.   Chief Complaint:   POST OP FOLLOW UP 06/19/2017 OPERATIVE REPORT POSTOPERATIVE DIAGNOSIS:  Recent myocardial infarction less than 48 hours with 3-vessel coronary artery disease. POSTOPERATIVE DIAGNOSIS:  Recent myocardial infarction less than 48 hours with 3-vessel coronary artery disease. SURGICAL PROCEDURES:  Coronary artery bypass grafting x4 with the left internal mammary to the left anterior descending coronary artery, reversed saphenous vein graft to the diagonal coronary artery, reversed saphenous vein graft to the first obtuse marginal coronary artery, reversed saphenous vein graft to the distal right coronary artery with right greater saphenous thigh and calf endoscopic vein harvesting.   SURGEON:  Sheliah Plane, MD.   History of Present Illness:     Patient returns to the office after recent coronary artery bypass grafting done urgently when he presented with acute myocardial infarction at [facility].  He is making good progress postoperatively he notes that his blood pressure has been somewhat elevated and heart rate a little slow, his amiodarone was stopped last week.  He has had no recurrent angina or evidence of congestive heart failure.    Past Medical History:  Diagnosis Date  . GERD (gastroesophageal reflux disease)   . High cholesterol      Social History   Tobacco Use  Smoking Status Current Every Day Smoker  . Types: Cigars  Smokeless Tobacco Never Used    Social History   Substance and Sexual Activity  Alcohol Use No     Allergies  Allergen Reactions  . Codeine Nausea Only    Current Outpatient Medications  Medication Sig Dispense  Refill  . acetaminophen (TYLENOL) 325 MG tablet Take 2 tablets (650 mg total) by mouth every 6 (six) hours as needed for mild pain.    Marland Kitchen aspirin EC 81 MG EC tablet Take 1 tablet (81 mg total) by mouth daily.    . clopidogrel (PLAVIX) 75 MG tablet Take 1 tablet (75 mg total) by mouth daily. 30 tablet 1  . cyclobenzaprine (FLEXERIL) 5 MG tablet Take 1 tablet (5 mg total) by mouth 3 (three) times daily as needed for muscle spasms. 15 tablet 0  . metoprolol tartrate (LOPRESSOR) 25 MG tablet Take 1 tablet (25 mg total) by mouth 2 (two) times daily. 30 tablet 1  . ondansetron (ZOFRAN) 4 MG tablet Take 1 tablet (4 mg total) by mouth every 6 (six) hours as needed for nausea. 20 tablet 0  . oxyCODONE (OXY IR/ROXICODONE) 5 MG immediate release tablet Take 5 mg by mouth every 4-6 hours PRN severe pain. 30 tablet 0  . pantoprazole (PROTONIX) 40 MG tablet Take 40 mg by mouth daily.    . rosuvastatin (CRESTOR) 40 MG tablet Take 1 tablet (40 mg total) by mouth daily at 6 PM. 30 tablet 1   No current facility-administered medications for this visit.        Physical Exam: BP (!) 154/80 (BP Location: Right Arm, Patient Position: Sitting, Cuff Size: Large)   Pulse (!) 46   Resp 18   Ht 6\' 1"  (1.854 m)   Wt 188 lb 6.4 oz (85.5 kg)   SpO2 97% Comment: RA  BMI 24.86 kg/m   General appearance: alert and cooperative Neurologic: intact Heart: regular rate and rhythm, S1, S2 normal, no murmur, click, rub or gallop Lungs: clear to auscultation bilaterally Abdomen: soft, non-tender; bowel sounds normal; no masses,  no organomegaly Extremities: extremities normal, atraumatic, no cyanosis or edema and Homans sign is negative, no sign of DVT Wound: Patient sternal incision is well-healed sternal is stable, right vein harvest site is also well-healed   Diagnostic Studies & Laboratory data:     Recent Radiology Findings:   Dg Chest 2 View  Result Date: 07/30/2017 CLINICAL DATA:  History of coronary bypass  graft 1 month ago, follow-up exam EXAM: CHEST  2 VIEW COMPARISON:  06/23/2017 FINDINGS: Cardiac shadow is within normal limits. Postsurgical changes are again noted. Previously seen left basilar changes have resolved in the interval. No focal infiltrate or sizable effusion is seen. No bony abnormality is noted. IMPRESSION: No acute abnormality noted. Electronically Signed   By: Alcide CleverMark  Lukens M.D.   On: 07/30/2017 13:22      Recent Lab Findings: Lab Results  Component Value Date   WBC 5.6 06/23/2017   HGB 13.0 06/23/2017   HCT 39.0 06/23/2017   PLT 110 (L) 06/23/2017   GLUCOSE 114 (H) 06/24/2017   CHOL 173 06/17/2017   TRIG 85 06/17/2017   HDL 47 06/17/2017   LDLCALC 109 (H) 06/17/2017   ALT 37 06/17/2017   AST 37 06/17/2017   NA 136 06/24/2017   K 3.4 (L) 06/24/2017   CL 101 06/24/2017   CREATININE 0.73 06/24/2017   BUN 13 06/24/2017   CO2 27 06/24/2017   TSH 10.283 (H) 06/23/2017   INR 1.36 06/19/2017   HGBA1C 6.1 (H) 06/17/2017      Assessment / Plan:      Patient is stable following recent coronary artery bypass grafting He is to start in cardiac rehab, he prefers [facility] as this is closer to his home He has mild elevation of TSH-follow-up with primary care  Overall I am pleased with his progress postoperatively, we will plan to see him back in the surgery office as needed     Delight OvensEdward B Tanyon Alipio MD      [address] [place] [id number] Office [phone]   Beeper [phone]  07/30/2017 3:03 PM

## 2017-07-30 NOTE — Patient Instructions (Signed)
    [address]       [place] [address]             [phone]       Coronary Artery Bypass Grafting  Care After  Refer to this sheet in the next few weeks. These instructions provide you with information on caring for yourself after your procedure. Your caregiver may also give you more specific instructions. Your treatment has been planned according to current medical practices, but problems sometimes occur. Call your caregiver if you have any problems or questions after your procedure.  Recovery from open heart surgery will be different for everyone. Some people feel well after 3 or 4 weeks, while for others it takes longer. After heart surgery, it may be normal to:  Not have an appetite, feel nauseated by the smell of food, or only want to eat a small amount.   Be constipated because of changes in your diet, activity, and medicines. Eat foods high in fiber. Add fresh fruits and vegetables to your diet. Stool softeners may be helpful.   Feel sad or unhappy. You may be frustrated or cranky. You may have good days and bad days. Do not give up. Talk to your caregiver if you do not feel better.   Feel weakness and fatigue. You many need physical therapy or cardiac rehabilitation to get your strength back.   Develop an irregular heartbeat called atrial fibrillation. Symptoms of atrial fibrillation are a fast, irregular heartbeat or feelings of fluttery heartbeats, shortness of breath, low blood pressure, and dizziness. If these symptoms develop, see your caregiver right away.  MEDICATION  Have a list of all the medicines you will be taking when you leave the hospital. For every medicine, know the following:   Name.   Exact dose.   Time of day to be taken.   How often it should be taken.   Why you are taking it.   Ask which medicines should or should not be taken together. If you take more than one heart medicine, ask if it is okay to take them together. Some  heart medicines should not be taken at the same time because they may lower your blood pressure too much.   Narcotic pain medicine can cause constipation. Eat fresh fruits and vegetables. Add fiber to your diet. Stool softener medicine may help relieve constipation.   Keep a copy of your medicines with you at all times.   Do not add or stop taking any medicine until you check with your caregiver.   Medicines can have side effects. Call your caregiver who prescribed the medicine if you:   Start throwing up, have diarrhea, or have stomach pain.   Feel dizzy or lightheaded when you stand up.   Feel your heart is skipping beats or is beating too fast or too slow.   Develop a rash.   Notice unusual bruising or bleeding.  HOME CARE INSTRUCTIONS  After heart surgery, it is important to learn how to take your pulse. Have your caregiver show you how to take your pulse.   Use your incentive spirometer. Ask your caregiver how long after surgery you need to use it.  Care of your chest incision  Tell your caregiver right away if you notice clicking in your chest (sternum).   Support your chest with a pillow or your arms when you take deep breaths and cough.   Follow your caregiver's instructions about when you can bathe or   swim.   Protect your incision from sunlight during the first year to keep the scar from getting dark.   Tell your caregiver if you notice:   Increased tenderness of your incision.   Increased redness or swelling around your incision.   Drainage or pus from your incision.  Care of your leg incision(s)  Avoid crossing your legs.   Avoid sitting for long periods of time. Change positions every half hour.   Elevate your leg(s) when you are sitting.   Check your leg(s) daily for swelling. Check the incisions for redness or drainage.   Diet is very important to heart health.   Eat plenty of fresh fruits and vegetables. Meats should be lean cut. Avoid canned,  processed, and fried foods.   Talk to a dietician. They can teach you how to make healthy food and drink choices.  Weight  Weigh yourself every day. This is important because it helps to know if you are retaining fluid that may make your heart and lungs work harder.   Use the same scale each time.   Weigh yourself every morning at the same time. You should do this after you go to the bathroom, but before you eat breakfast.   Your weight will be more accurate if you do not wear any clothes.   Record your weight.   Tell your caregiver if you have gained 2 pounds or more overnight.  Activity Stop any activity at once if you have chest pain, shortness of breath, irregular heartbeats, or dizziness. Get help right away if you have any of these symptoms.  Bathing.  Avoid soaking in a bath or hot tub until your incisions are healed.   Rest. You need a balance of rest and activity.   Exercise. Exercise per your caregiver's advice. You may need physical therapy or cardiac rehabilitation to help strengthen your muscles and build your endurance.   Climbing stairs. Unless your caregiver tells you not to climb stairs, go up stairs slowly and rest if you tire. Do not pull yourself up by the handrail.   Driving a car. Follow your caregiver's advice on when you may drive. You may ride as a passenger at any time. When traveling for long periods of time in a car, get out of the car and walk around for a few minutes every 2 hours.   Lifting. Avoid lifting, pushing, or pulling anything heavier than 10 pounds for 6 weeks after surgery or as told by your caregiver.   Returning to work. Check with your caregiver. People heal at different rates. Most people will be able to go back to work 6 to 12 weeks after surgery.   Sexual activity. You may resume sexual relations as told by your caregiver.  SEEK MEDICAL CARE IF:  Any of your incisions are red, painful, or have any type of drainage coming from them.     You have an oral temperature above 101.5 F .   You have ankle or leg swelling.   You have pain in your legs.   You have weight gain of 2 or more pounds a day.   You feel dizzy or lightheaded when you stand up.  SEEK IMMEDIATE MEDICAL CARE IF:  You have angina or chest pain that goes to your jaw or arms. Call your local emergency services right away.   You have shortness of breath at rest or with activity.   You have a fast or irregular heartbeat (arrhythmia).   There is   a "clicking" in your sternum when you move.   You have numbness or weakness in your arms or legs.  MAKE SURE YOU:  Understand these instructions.   Will watch your condition.   Will get help right away if you are not doing well or get worse.    No lifting over 25 lbs for 3 months 

## 2017-08-15 ENCOUNTER — Other Ambulatory Visit: Payer: Self-pay | Admitting: Physician Assistant

## 2017-08-24 ENCOUNTER — Encounter (HOSPITAL_COMMUNITY): Payer: Self-pay

## 2017-08-24 ENCOUNTER — Encounter (HOSPITAL_COMMUNITY)
Admission: RE | Admit: 2017-08-24 | Discharge: 2017-08-24 | Disposition: A | Discharge status: Home / Self Care | Payer: Medicare HMO | Source: Ambulatory Visit | Attending: Cardiology | Admitting: Cardiology

## 2017-08-24 VITALS — BP 140/80 | HR 54 | Ht 73.0 in | Wt 193.0 lb

## 2017-08-24 DIAGNOSIS — I252 Old myocardial infarction: Secondary | ICD-10-CM | POA: Insufficient documentation

## 2017-08-24 DIAGNOSIS — Z79891 Long term (current) use of opiate analgesic: Secondary | ICD-10-CM | POA: Insufficient documentation

## 2017-08-24 DIAGNOSIS — Z7982 Long term (current) use of aspirin: Secondary | ICD-10-CM | POA: Diagnosis not present

## 2017-08-24 DIAGNOSIS — Z79899 Other long term (current) drug therapy: Secondary | ICD-10-CM | POA: Insufficient documentation

## 2017-08-24 DIAGNOSIS — Z48812 Encounter for surgical aftercare following surgery on the circulatory system: Secondary | ICD-10-CM | POA: Insufficient documentation

## 2017-08-24 DIAGNOSIS — I213 ST elevation (STEMI) myocardial infarction of unspecified site: Secondary | ICD-10-CM

## 2017-08-24 DIAGNOSIS — Z951 Presence of aortocoronary bypass graft: Secondary | ICD-10-CM

## 2017-08-24 DIAGNOSIS — K219 Gastro-esophageal reflux disease without esophagitis: Secondary | ICD-10-CM | POA: Diagnosis not present

## 2017-08-24 DIAGNOSIS — Z87891 Personal history of nicotine dependence: Secondary | ICD-10-CM | POA: Diagnosis not present

## 2017-08-24 HISTORY — DX: ST elevation (STEMI) myocardial infarction of unspecified site: I21.3

## 2017-08-24 NOTE — Progress Notes (Signed)
Cardiac/Pulmonary Rehab Medication Review by a Pharmacist  Does the patient  feel that his/her medications are working for him/her?  yes  Has the patient been experiencing any side effects to the medications prescribed?  yes  Does the patient measure his/her own blood pressure or blood glucose at home?  yes   Does the patient have any problems obtaining medications due to transportation or finances?   no  Understanding of regimen: excellent Understanding of indications: excellent Potential of compliance: excellent  Questions asked to Determine Patient Understanding of Medication Regimen:  1. What is the name of the medication?  2. What is the medication used for?  3. When should it be taken?  4. How much should be taken?  5. How will you take it?  6. What side effects should you report?  Understanding Defined as: Excellent: All questions above are correct Good: Questions 1-4 are correct Fair: Questions 1-2 are correct  Poor: 1 or none of the above questions are correct   Pharmacist comments: Pt does c/o itching at night and states it is from the Plavix, pt takes Benadryl at night to control the itching.  Recommending asking MD if would be OK to add a once daily non-sedating antihistamine like Zyrtec or Claritin if problem gets worse.  Pt does check BP at home daily and currently takes Lisinopril 5mg  daily.    Valrie HartHall, Miquela Costabile A 08/24/2017 9:01 AM

## 2017-08-24 NOTE — Progress Notes (Signed)
Daily Session Note  Patient Details  Name: Curtis Sherman MRN: [id number] Date of Birth: 08/10/1955 Referring Provider:     CARDIAC REHAB PHASE II ORIENTATION from 08/24/2017 in [facility]  Referring Provider  Dr. Paeaschos      Encounter Date: 08/24/2017  Check In: Session Check In - 08/24/17 0800      Check-In   Location  [facility]  (Pended)     Staff Present  Diane Coad, MS, EP, CHC, Exercise Physiologist;Gregory Cowan, BS, EP, Exercise Physiologist;Debra Johnson, RN, BSN  (Pended)     Supervising physician immediately available to respond to emergencies  See telemetry face sheet for immediately available MD  (Pended)     Medication changes reported      No  (Pended)     Fall or balance concerns reported     No  (Pended)     Tobacco Cessation  --  (Pended)  Quit smoking 2015, Quit cigars 06/17/2017       Capillary Blood Glucose: No results found for this or any previous visit (from the past 24 hour(s)).    Social History   Tobacco Use  Smoking Status Former Smoker  . Packs/day: 1.50  . Years: 43.00  . Pack years: 64.50  . Types: Cigars, Cigarettes  . Last attempt to quit: 07/30/2013  . Years since quitting: 4.0  Smokeless Tobacco Never Used    Goals Met:  Independence with exercise equipment Exercise tolerated well No report of cardiac concerns or symptoms Strength training completed today  Goals Unmet:  Not Applicable  Comments: Check out at 10:30   Dr. Suresh Koneswaran is Medical Director for [facility]. 

## 2017-08-24 NOTE — Progress Notes (Signed)
Cardiac Individual Treatment Plan  Patient Details  Name: Curtis Sherman MRN: [id number] Date of Birth: 29-Aug-1955 Referring Provider:     CARDIAC REHAB PHASE II ORIENTATION from 08/24/2017 in [facility] CARDIAC REHABILITATION  Referring Provider  Dr. Larose Kells      Initial Encounter Date:    CARDIAC REHAB PHASE II ORIENTATION from 08/24/2017 in [place] CARDIAC REHABILITATION  Date  08/24/17  Referring Provider  Dr. Larose Kells      Visit Diagnosis: ST elevation myocardial infarction (STEMI), unspecified artery (HCC)  S/P CABG x 4  Patient's Home Medications on Admission:  Current Outpatient Medications:  .  diphenhydrAMINE (BENADRYL) 25 mg capsule, Take 25 mg by mouth at bedtime as needed for itching., Disp: , Rfl:  .  lisinopril (PRINIVIL,ZESTRIL) 5 MG tablet, Take 5 mg by mouth daily., Disp: , Rfl:  .  acetaminophen (TYLENOL) 325 MG tablet, Take 2 tablets (650 mg total) by mouth every 6 (six) hours as needed for mild pain., Disp: , Rfl:  .  aspirin EC 81 MG EC tablet, Take 1 tablet (81 mg total) by mouth daily., Disp: , Rfl:  .  clopidogrel (PLAVIX) 75 MG tablet, TAKE 1 TABLET BY MOUTH ONCE DAILY, Disp: 30 tablet, Rfl: 1 .  metoprolol tartrate (LOPRESSOR) 25 MG tablet, Take 1 tablet (25 mg total) by mouth 2 (two) times daily., Disp: 30 tablet, Rfl: 1 .  oxyCODONE (OXY IR/ROXICODONE) 5 MG immediate release tablet, Take 5 mg by mouth every 4-6 hours PRN severe pain., Disp: 30 tablet, Rfl: 0 .  pantoprazole (PROTONIX) 40 MG tablet, Take 40 mg by mouth daily., Disp: , Rfl:  .  rosuvastatin (CRESTOR) 40 MG tablet, TAKE 1 TABLET BY MOUTH ONCE DAILY AT  6PM, Disp: 30 tablet, Rfl: 1  Past Medical History: Past Medical History:  Diagnosis Date  . GERD (gastroesophageal reflux disease)   . High cholesterol   . STEMI (ST elevation myocardial infarction) (HCC) 06/17/2017    Tobacco Use: Social History   Tobacco Use  Smoking Status Former Smoker  . Packs/day: 1.50  . Years:  43.00  . Pack years: 64.50  . Types: Cigars, Cigarettes  . Last attempt to quit: 07/30/2013  . Years since quitting: 4.0  Smokeless Tobacco Never Used    Labs: Recent Review Flowsheet Data    Labs for ITP Cardiac and Pulmonary Rehab Latest Ref Rng & Units 06/19/2017 06/19/2017 06/19/2017 06/19/2017 06/19/2017   Cholestrol 0 - 200 mg/dL - - - - -   LDLCALC 0 - 99 mg/dL - - - - -   HDL >40 mg/dL - - - - -   Trlycerides <150 mg/dL - - - - -   Hemoglobin A1c 4.8 - 5.6 % - - - - -   PHART 7.350 - 7.450 7.281(L) 7.263(L) 7.285(L) 7.307(L) -   PCO2ART 32.0 - 48.0 mmHg 46.2 46.5 42.2 44.7 -   HCO3 20.0 - 28.0 mmol/L 22.1 21.1 20.1 22.3 -   TCO2 22 - 32 mmol/L 24 23 21(L) 24 24   ACIDBASEDEF 0.0 - 2.0 mmol/L 5.0(H) 6.0(H) 6.0(H) 4.0(H) -   O2SAT % 97.0 96.0 97.0 97.0 -      Capillary Blood Glucose: Lab Results  Component Value Date   GLUCAP 91 06/23/2017   GLUCAP 124 (H) 06/22/2017   GLUCAP 94 06/22/2017   GLUCAP 128 (H) 06/21/2017   GLUCAP 107 (H) 06/21/2017     Exercise Target Goals: Date: 08/24/17  Exercise Program Goal: Individual exercise prescription set using  results from initial 6 min walk test and THRR while considering  patient's activity barriers and safety.   Exercise Prescription Goal: Starting with aerobic activity 30 plus minutes a day, 3 days per week for initial exercise prescription. Provide home exercise prescription and guidelines that participant acknowledges understanding prior to discharge.  Activity Barriers & Risk Stratification: Activity Barriers & Cardiac Risk Stratification - 08/24/17 1018      Activity Barriers & Cardiac Risk Stratification   Activity Barriers  Back Problems    Cardiac Risk Stratification  High       6 Minute Walk: 6 Minute Walk    Row Name 08/24/17 1017         6 Minute Walk   Phase  Initial     Distance  1400 feet     Distance % Change  0 %     Distance Feet Change  0 ft     Walk Time  6 minutes     # of Rest  Breaks  0     MPH  2.65     METS  3     RPE  13     Perceived Dyspnea   12     VO2 Peak  14.03     Symptoms  No     Resting HR  54 bpm     Resting BP  140/80     Resting Oxygen Saturation   95 %     Exercise Oxygen Saturation  during 6 min walk  93 %     Max Ex. HR  98 bpm     Max Ex. BP  170/84     2 Minute Post BP  136/76        Oxygen Initial Assessment:   Oxygen Re-Evaluation:   Oxygen Discharge (Final Oxygen Re-Evaluation):   Initial Exercise Prescription: Initial Exercise Prescription - 08/24/17 1000      Date of Initial Exercise RX and Referring Provider   Date  08/24/17    Referring Provider  Dr. Larose Kells      Treadmill   MPH  2    Grade  0    Minutes  15    METs  2.5      Recumbant Elliptical   Level  1    RPM  50    Watts  55    Minutes  20    METs  3.5      Prescription Details   Frequency (times per week)  3    Duration  Progress to 30 minutes of continuous aerobic without signs/symptoms of physical distress      Intensity   THRR 40-80% of Max Heartrate  [phone]    Ratings of Perceived Exertion  11-13    Perceived Dyspnea  0-4      Progression   Progression  Continue progressive overload as per policy without signs/symptoms or physical distress.      Resistance Training   Training Prescription  Yes    Weight  1    Reps  10-15       Perform Capillary Blood Glucose checks as needed.  Exercise Prescription Changes:   Exercise Comments:   Exercise Goals and Review: Exercise Goals    Row Name 08/24/17 1019             Exercise Goals   Increase Physical Activity  Yes       Intervention  Provide advice, education, support and counseling about physical activity/exercise needs.;Develop  an individualized exercise prescription for aerobic and resistive training based on initial evaluation findings, risk stratification, comorbidities and participant's personal goals.       Expected Outcomes  Short Term: Attend rehab on a regular  basis to increase amount of physical activity.       Increase Strength and Stamina  Yes       Intervention  Provide advice, education, support and counseling about physical activity/exercise needs.;Develop an individualized exercise prescription for aerobic and resistive training based on initial evaluation findings, risk stratification, comorbidities and participant's personal goals.       Expected Outcomes  Short Term: Increase workloads from initial exercise prescription for resistance, speed, and METs.       Able to understand and use rate of perceived exertion (RPE) scale  Yes       Intervention  Provide education and explanation on how to use RPE scale       Expected Outcomes  Short Term: Able to use RPE daily in rehab to express subjective intensity level;Long Term:  Able to use RPE to guide intensity level when exercising independently       Able to understand and use Dyspnea scale  Yes       Intervention  Provide education and explanation on how to use Dyspnea scale       Expected Outcomes  Short Term: Able to use Dyspnea scale daily in rehab to express subjective sense of shortness of breath during exertion;Long Term: Able to use Dyspnea scale to guide intensity level when exercising independently       Knowledge and understanding of Target Heart Rate Range (THRR)  Yes       Intervention  Provide education and explanation of THRR including how the numbers were predicted and where they are located for reference       Expected Outcomes  Short Term: Able to state/look up THRR;Long Term: Able to use THRR to govern intensity when exercising independently;Short Term: Able to use daily as guideline for intensity in rehab       Able to check pulse independently  Yes       Intervention  Provide education and demonstration on how to check pulse in carotid and radial arteries.;Review the importance of being able to check your own pulse for safety during independent exercise       Expected Outcomes   Long Term: Able to check pulse independently and accurately;Short Term: Able to explain why pulse checking is important during independent exercise       Understanding of Exercise Prescription  Yes       Intervention  Provide education, explanation, and written materials on patient's individual exercise prescription       Expected Outcomes  Short Term: Able to explain program exercise prescription;Long Term: Able to explain home exercise prescription to exercise independently          Exercise Goals Re-Evaluation :    Discharge Exercise Prescription (Final Exercise Prescription Changes):   Nutrition:  Target Goals: Understanding of nutrition guidelines, daily intake of sodium 1500mg , cholesterol 200mg , calories 30% from fat and 7% or less from saturated fats, daily to have 5 or more servings of fruits and vegetables.  Biometrics: Pre Biometrics - 08/24/17 1019      Pre Biometrics   Height  6\' 1"  (1.854 m)    Weight  193 lb (87.5 kg)    Waist Circumference  38 inches    Hip Circumference  39 inches    Waist  to Hip Ratio  0.97 %    BMI (Calculated)  25.47    Triceps Skinfold  7 mm    % Body Fat  22.1 %    Grip Strength  80 kg    Flexibility  11.5 in    Single Leg Stand  4 seconds        Nutrition Therapy Plan and Nutrition Goals: Nutrition Therapy & Goals - 08/24/17 1132      Personal Nutrition Goals   Personal Goal #2  Patient is eating a very heart healthy diet. Low sodium, low fat, plenty for vegetables an healthy meats and fish.    Additional Goals?  No       Nutrition Assessments:   Nutrition Goals Re-Evaluation:   Nutrition Goals Discharge (Final Nutrition Goals Re-Evaluation):   Psychosocial: Target Goals: Acknowledge presence or absence of significant depression and/or stress, maximize coping skills, provide positive support system. Participant is able to verbalize types and ability to use techniques and skills needed for reducing stress and  depression.  Initial Review & Psychosocial Screening: Initial Psych Review & Screening - 08/24/17 1135      Initial Review   Current issues with  None Identified      Family Dynamics   Good Support System?  Yes      Barriers   Psychosocial barriers to participate in program  There are no identifiable barriers or psychosocial needs.      Screening Interventions   Interventions  Encouraged to exercise    Expected Outcomes  Short Term goal: Identification and review with participant of any Quality of Life or Depression concerns found by scoring the questionnaire.;Long Term goal: The participant improves quality of Life and PHQ9 Scores as seen by post scores and/or verbalization of changes       Quality of Life Scores: Quality of Life - 08/24/17 1020      Quality of Life Scores   Health/Function Pre  26.72 %    Socioeconomic Pre  28.13 %    Psych/Spiritual Pre  24 %    Family Pre  30 %    GLOBAL Pre  27.74 %      Scores of 19 and below usually indicate a poorer quality of life in these areas.  A difference of  2-3 points is a clinically meaningful difference.  A difference of 2-3 points in the total score of the Quality of Life Index has been associated with significant improvement in overall quality of life, self-image, physical symptoms, and general health in studies assessing change in quality of life.  PHQ-9: Recent Review Flowsheet Data    Depression screen [facility] 2/9 08/24/2017   Decreased Interest 0   Down, Depressed, Hopeless 0   PHQ - 2 Score 0   Altered sleeping 0   Tired, decreased energy 0   Change in appetite 0   Feeling bad or failure about yourself  0   Trouble concentrating 0   Moving slowly or fidgety/restless 0   Suicidal thoughts 0   PHQ-9 Score 0     Interpretation of Total Score  Total Score Depression Severity:  1-4 = Minimal depression, 5-9 = Mild depression, 10-14 = Moderate depression, 15-19 = Moderately severe depression, 20-27 = Severe depression    Psychosocial Evaluation and Intervention: Psychosocial Evaluation - 08/24/17 1136      Psychosocial Evaluation & Interventions   Interventions  Encouraged to exercise with the program and follow exercise prescription    Continue Psychosocial Services  No Follow up required       Psychosocial Re-Evaluation:   Psychosocial Discharge (Final Psychosocial Re-Evaluation):   Vocational Rehabilitation: Provide vocational rehab assistance to qualifying candidates.   Vocational Rehab Evaluation & Intervention: Vocational Rehab - 08/24/17 1103      Initial Vocational Rehab Evaluation & Intervention   Assessment shows need for Vocational Rehabilitation  No       Education: Education Goals: Education classes will be provided on a weekly basis, covering required topics. Participant will state understanding/return demonstration of topics presented.  Learning Barriers/Preferences: Learning Barriers/Preferences - 08/24/17 1103      Learning Barriers/Preferences   Learning Barriers  None    Learning Preferences  Verbal Instruction;Skilled Demonstration;Pictoral;Video       Education Topics: Hypertension, Hypertension Reduction -Define heart disease and high blood pressure. Discus how high blood pressure affects the body and ways to reduce high blood pressure.   Exercise and Your Heart -Discuss why it is important to exercise, the FITT principles of exercise, normal and abnormal responses to exercise, and how to exercise safely.   Angina -Discuss definition of angina, causes of angina, treatment of angina, and how to decrease risk of having angina.   Cardiac Medications -Review what the following cardiac medications are used for, how they affect the body, and side effects that may occur when taking the medications.  Medications include Aspirin, Beta blockers, calcium channel blockers, ACE Inhibitors, angiotensin receptor blockers, diuretics, digoxin, and  antihyperlipidemics.   Congestive Heart Failure -Discuss the definition of CHF, how to live with CHF, the signs and symptoms of CHF, and how keep track of weight and sodium intake.   Heart Disease and Intimacy -Discus the effect sexual activity has on the heart, how changes occur during intimacy as we age, and safety during sexual activity.   Smoking Cessation / COPD -Discuss different methods to quit smoking, the health benefits of quitting smoking, and the definition of COPD.   Nutrition I: Fats -Discuss the types of cholesterol, what cholesterol does to the heart, and how cholesterol levels can be controlled.   Nutrition II: Labels -Discuss the different components of food labels and how to read food label   Heart Parts/Heart Disease and PAD -Discuss the anatomy of the heart, the pathway of blood circulation through the heart, and these are affected by heart disease.   Stress I: Signs and Symptoms -Discuss the causes of stress, how stress may lead to anxiety and depression, and ways to limit stress.   Stress II: Relaxation -Discuss different types of relaxation techniques to limit stress.   Warning Signs of Stroke / TIA -Discuss definition of a stroke, what the signs and symptoms are of a stroke, and how to identify when someone is having stroke.   Knowledge Questionnaire Score: Knowledge Questionnaire Score - 08/24/17 1103      Knowledge Questionnaire Score   Pre Score  22/24       Core Components/Risk Factors/Patient Goals at Admission: Personal Goals and Risk Factors at Admission - 08/24/17 1133      Core Components/Risk Factors/Patient Goals on Admission    Weight Management  Weight Maintenance    Tobacco Cessation  -- Has already quit smoking cigarettes and cigars.     Personal Goal Other  Yes    Personal Goal  Get back to ADL's. amd playing golf.     Intervention  Attend CR 3 x week and supplement with home exercise 2 days week.     Expected Outcomes  Achieve personal goals.        Core Components/Risk Factors/Patient Goals Review:    Core Components/Risk Factors/Patient Goals at Discharge (Final Review):    ITP Comments: ITP Comments    Row Name 08/24/17 1130           ITP Comments  Mr. Kashuba is a pleasant 62 year old man. He is already walking daily at the [facility] sometimes twice a day. Patient is eager to get started.           Comments: Patient arrived for 1st visit/orientation/education at 0800. Patient was referred to CR by Hochrein due to CABGx4 (Z95.1) and STEMI (I21.3). During orientation advised patient on arrival and appointment times what to wear, what to do before, during and after exercise. Reviewed attendance and class policy. Talked about inclement weather and class consultation policy. Pt is scheduled to return Cardiac Rehab on 08/26/17 at 0930. Pt was advised to come to class 15 minutes before class starts. Patient was also given instructions on meeting with the dietician and attending the Family Structure classes. Discussed RPE/Dpysnea scales. Discussed initial THR and how to find their radial and/or carotid pulse. Discussed the initial exercise prescription and how this effects their progress. Pt is eager to get started. Patient participated in warm up stretches followed by light weights and resistance bands. Patient was able to complete 6 minute walk test. Patient did not c/o pain. Patient was measured for the equipment. Discussed equipment safety with patient. Took patient pre-anthropometric measurements. Patient finished visit at 10:30.

## 2017-08-26 ENCOUNTER — Encounter (HOSPITAL_COMMUNITY)
Admission: RE | Admit: 2017-08-26 | Discharge: 2017-08-26 | Disposition: A | Discharge status: Home / Self Care | Payer: Medicare HMO | Source: Ambulatory Visit | Attending: Cardiology | Admitting: Cardiology

## 2017-08-26 DIAGNOSIS — Z951 Presence of aortocoronary bypass graft: Secondary | ICD-10-CM

## 2017-08-26 DIAGNOSIS — I213 ST elevation (STEMI) myocardial infarction of unspecified site: Secondary | ICD-10-CM

## 2017-08-26 NOTE — Progress Notes (Signed)
Daily Session Note  Patient Details  Name: Curtis Sherman MRN: [id number] Date of Birth: 1956/06/22 Referring Provider:     CARDIAC REHAB PHASE II ORIENTATION from 08/24/2017 in [place]  Referring Provider  Dr. [name]      Encounter Date: 08/26/2017  Check In: Session Check In - 08/26/17 1007      Check-In   Location  [facility]    Staff Present  Diane Angelina Pih, MS, EP, [facility], Exercise Physiologist;Shelagh Rayman Luther Parody, BS, EP, Exercise Physiologist;Debra Wynetta Emery, RN, BSN    Supervising physician immediately available to respond to emergencies  See telemetry face sheet for immediately available MD    Medication changes reported      No    Fall or balance concerns reported     No    Tobacco Cessation  No Change    Warm-up and Cool-down  Performed as group-led instruction    Resistance Training Performed  Yes    VAD Patient?  No      Pain Assessment   Currently in Pain?  No/denies    Pain Score  0-No pain    Multiple Pain Sites  No       Capillary Blood Glucose: No results found for this or any previous visit (from the past 24 hour(s)).    Social History   Tobacco Use  Smoking Status Former Smoker  . Packs/day: 1.50  . Years: 43.00  . Pack years: 64.50  . Types: Cigars, Cigarettes  . Last attempt to quit: 07/30/2013  . Years since quitting: 4.0  Smokeless Tobacco Never Used    Goals Met:  Independence with exercise equipment Exercise tolerated well No report of cardiac concerns or symptoms Strength training completed today  Goals Unmet:  Not Applicable  Comments: Check out 1030   Dr. Kate Sable is Medical Director for [place] and Pulmonary Rehab.

## 2017-08-28 ENCOUNTER — Encounter (HOSPITAL_COMMUNITY)
Admission: RE | Admit: 2017-08-28 | Discharge: 2017-08-28 | Disposition: A | Discharge status: Home / Self Care | Payer: Medicare HMO | Source: Ambulatory Visit | Attending: Cardiology | Admitting: Cardiology

## 2017-08-28 DIAGNOSIS — Z79891 Long term (current) use of opiate analgesic: Secondary | ICD-10-CM | POA: Insufficient documentation

## 2017-08-28 DIAGNOSIS — I252 Old myocardial infarction: Secondary | ICD-10-CM | POA: Insufficient documentation

## 2017-08-28 DIAGNOSIS — Z48812 Encounter for surgical aftercare following surgery on the circulatory system: Secondary | ICD-10-CM | POA: Diagnosis not present

## 2017-08-28 DIAGNOSIS — Z7982 Long term (current) use of aspirin: Secondary | ICD-10-CM | POA: Diagnosis not present

## 2017-08-28 DIAGNOSIS — Z951 Presence of aortocoronary bypass graft: Secondary | ICD-10-CM

## 2017-08-28 DIAGNOSIS — K219 Gastro-esophageal reflux disease without esophagitis: Secondary | ICD-10-CM | POA: Insufficient documentation

## 2017-08-28 DIAGNOSIS — I213 ST elevation (STEMI) myocardial infarction of unspecified site: Secondary | ICD-10-CM

## 2017-08-28 DIAGNOSIS — Z87891 Personal history of nicotine dependence: Secondary | ICD-10-CM | POA: Insufficient documentation

## 2017-08-28 DIAGNOSIS — Z79899 Other long term (current) drug therapy: Secondary | ICD-10-CM | POA: Insufficient documentation

## 2017-08-28 NOTE — Progress Notes (Signed)
Daily Session Note  Patient Details  Name: Curtis Sherman MRN: [id number] Date of Birth: [birth year] Referring Provider:     CARDIAC REHAB PHASE II ORIENTATION from 08/24/2017 in [place]  Referring Provider  Dr. [name]      Encounter Date: 08/28/2017  Check In: Session Check In - 08/28/17 1012      Check-In   Location  [facility]    Staff Present  Diane Angelina Pih, MS, EP, [facility], Exercise Physiologist;Modell Fendrick Luther Parody, BS, EP, Exercise Physiologist;Debra Wynetta Emery, RN, BSN    Supervising physician immediately available to respond to emergencies  See telemetry face sheet for immediately available MD    Medication changes reported      No    Fall or balance concerns reported     No    Tobacco Cessation  No Change    Warm-up and Cool-down  Performed as group-led instruction    Resistance Training Performed  Yes    VAD Patient?  No      Pain Assessment   Currently in Pain?  No/denies    Pain Score  0-No pain    Multiple Pain Sites  No       Capillary Blood Glucose: No results found for this or any previous visit (from the past 24 hour(s)).    Social History   Tobacco Use  Smoking Status Former Smoker  . Packs/day: 1.50  . Years: 43.00  . Pack years: 64.50  . Types: Cigars, Cigarettes  . Last attempt to quit: 07/30/2013  . Years since quitting: 4.0  Smokeless Tobacco Never Used    Goals Met:  Independence with exercise equipment Exercise tolerated well No report of cardiac concerns or symptoms Strength training completed today  Goals Unmet:  Not Applicable  Comments: Check out 1030   Dr. Kate Sable is Medical Director for [place] and Pulmonary Rehab.

## 2017-08-31 ENCOUNTER — Encounter (HOSPITAL_COMMUNITY)
Admission: RE | Admit: 2017-08-31 | Discharge: 2017-08-31 | Disposition: A | Discharge status: Home / Self Care | Payer: Medicare HMO | Source: Ambulatory Visit | Attending: Cardiology | Admitting: Cardiology

## 2017-08-31 DIAGNOSIS — I213 ST elevation (STEMI) myocardial infarction of unspecified site: Secondary | ICD-10-CM

## 2017-08-31 DIAGNOSIS — Z951 Presence of aortocoronary bypass graft: Secondary | ICD-10-CM | POA: Diagnosis not present

## 2017-08-31 NOTE — Progress Notes (Signed)
Daily Session Note  Patient Details  Name: Curtis Sherman MRN: [id number] Date of Birth: 04/02/1956 Referring Provider:     CARDIAC REHAB PHASE II ORIENTATION from 08/24/2017 in [place]  Referring Provider  Dr. [name]      Encounter Date: 08/31/2017  Check In: Session Check In - 08/31/17 0942      Check-In   Location  [facility]    Staff Present  Diane Angelina Pih, MS, EP, [facility], Exercise Physiologist;Lashawnna Lambrecht Luther Parody, BS, EP, Exercise Physiologist;Debra Wynetta Emery, RN, BSN    Supervising physician immediately available to respond to emergencies  See telemetry face sheet for immediately available MD    Medication changes reported      No    Fall or balance concerns reported     No    Tobacco Cessation  No Change    Warm-up and Cool-down  Performed as group-led instruction    Resistance Training Performed  Yes    VAD Patient?  No      Pain Assessment   Currently in Pain?  No/denies    Pain Score  0-No pain    Multiple Pain Sites  No       Capillary Blood Glucose: No results found for this or any previous visit (from the past 24 hour(s)).    Social History   Tobacco Use  Smoking Status Former Smoker  . Packs/day: 1.50  . Years: 43.00  . Pack years: 64.50  . Types: Cigars, Cigarettes  . Last attempt to quit: 07/30/2013  . Years since quitting: 4.0  Smokeless Tobacco Never Used    Goals Met:  Independence with exercise equipment Exercise tolerated well No report of cardiac concerns or symptoms Strength training completed today  Goals Unmet:  Not Applicable  Comments: Check out 1030   Dr. Kate Sable is Medical Director for [place] and Pulmonary Rehab.

## 2017-09-02 ENCOUNTER — Encounter (HOSPITAL_COMMUNITY)
Admission: RE | Admit: 2017-09-02 | Discharge: 2017-09-02 | Disposition: A | Discharge status: Home / Self Care | Payer: Medicare HMO | Source: Ambulatory Visit | Attending: Cardiology | Admitting: Cardiology

## 2017-09-02 DIAGNOSIS — Z951 Presence of aortocoronary bypass graft: Secondary | ICD-10-CM

## 2017-09-02 DIAGNOSIS — I213 ST elevation (STEMI) myocardial infarction of unspecified site: Secondary | ICD-10-CM

## 2017-09-02 NOTE — Progress Notes (Signed)
Patient received the take home exercise plan today 09/02/2017. THR was addressed as were safety guidelines for being active while not in CR. Patient demonstrated an understanding and was encouraged to ask any future questions as they arise.  

## 2017-09-02 NOTE — Progress Notes (Signed)
Cardiac Individual Treatment Plan  Patient Details  Name: Curtis Sherman MRN: [id number] Date of Birth: 1956-06-07 Referring Provider:     CARDIAC REHAB PHASE II ORIENTATION from 08/24/2017 in [facility] CARDIAC REHABILITATION  Referring Provider  Dr. Larose Kells      Initial Encounter Date:    CARDIAC REHAB PHASE II ORIENTATION from 08/24/2017 in [place] CARDIAC REHABILITATION  Date  08/24/17  Referring Provider  Dr. Larose Kells      Visit Diagnosis: S/P CABG x 4  ST elevation myocardial infarction (STEMI), unspecified artery (HCC)  Patient's Home Medications on Admission:  Current Outpatient Medications:  .  acetaminophen (TYLENOL) 325 MG tablet, Take 2 tablets (650 mg total) by mouth every 6 (six) hours as needed for mild pain., Disp: , Rfl:  .  aspirin EC 81 MG EC tablet, Take 1 tablet (81 mg total) by mouth daily., Disp: , Rfl:  .  clopidogrel (PLAVIX) 75 MG tablet, TAKE 1 TABLET BY MOUTH ONCE DAILY, Disp: 30 tablet, Rfl: 1 .  diphenhydrAMINE (BENADRYL) 25 mg capsule, Take 25 mg by mouth at bedtime as needed for itching., Disp: , Rfl:  .  lisinopril (PRINIVIL,ZESTRIL) 5 MG tablet, Take 5 mg by mouth daily., Disp: , Rfl:  .  metoprolol tartrate (LOPRESSOR) 25 MG tablet, Take 1 tablet (25 mg total) by mouth 2 (two) times daily., Disp: 30 tablet, Rfl: 1 .  oxyCODONE (OXY IR/ROXICODONE) 5 MG immediate release tablet, Take 5 mg by mouth every 4-6 hours PRN severe pain., Disp: 30 tablet, Rfl: 0 .  pantoprazole (PROTONIX) 40 MG tablet, Take 40 mg by mouth daily., Disp: , Rfl:  .  rosuvastatin (CRESTOR) 40 MG tablet, TAKE 1 TABLET BY MOUTH ONCE DAILY AT  6PM, Disp: 30 tablet, Rfl: 1  Past Medical History: Past Medical History:  Diagnosis Date  . GERD (gastroesophageal reflux disease)   . High cholesterol   . STEMI (ST elevation myocardial infarction) (HCC) 06/17/2017    Tobacco Use: Social History   Tobacco Use  Smoking Status Former Smoker  . Packs/day: 1.50  . Years:  43.00  . Pack years: 64.50  . Types: Cigars, Cigarettes  . Last attempt to quit: 07/30/2013  . Years since quitting: 4.0  Smokeless Tobacco Never Used    Labs: Recent Review Flowsheet Data    Labs for ITP Cardiac and Pulmonary Rehab Latest Ref Rng & Units 06/19/2017 06/19/2017 06/19/2017 06/19/2017 06/19/2017   Cholestrol 0 - 200 mg/dL - - - - -   LDLCALC 0 - 99 mg/dL - - - - -   HDL >82 mg/dL - - - - -   Trlycerides <150 mg/dL - - - - -   Hemoglobin A1c 4.8 - 5.6 % - - - - -   PHART 7.350 - 7.450 7.281(L) 7.263(L) 7.285(L) 7.307(L) -   PCO2ART 32.0 - 48.0 mmHg 46.2 46.5 42.2 44.7 -   HCO3 20.0 - 28.0 mmol/L 22.1 21.1 20.1 22.3 -   TCO2 22 - 32 mmol/L 24 23 21(L) 24 24   ACIDBASEDEF 0.0 - 2.0 mmol/L 5.0(H) 6.0(H) 6.0(H) 4.0(H) -   O2SAT % 97.0 96.0 97.0 97.0 -      Capillary Blood Glucose: Lab Results  Component Value Date   GLUCAP 91 06/23/2017   GLUCAP 124 (H) 06/22/2017   GLUCAP 94 06/22/2017   GLUCAP 128 (H) 06/21/2017   GLUCAP 107 (H) 06/21/2017     Exercise Target Goals:    Exercise Program Goal: Individual exercise prescription set using  results from initial 6 min walk test and THRR while considering  patient's activity barriers and safety.   Exercise Prescription Goal: Starting with aerobic activity 30 plus minutes a day, 3 days per week for initial exercise prescription. Provide home exercise prescription and guidelines that participant acknowledges understanding prior to discharge.  Activity Barriers & Risk Stratification: Activity Barriers & Cardiac Risk Stratification - 08/24/17 1018      Activity Barriers & Cardiac Risk Stratification   Activity Barriers  Back Problems    Cardiac Risk Stratification  High       6 Minute Walk: 6 Minute Walk    Row Name 08/24/17 1017         6 Minute Walk   Phase  Initial     Distance  1400 feet     Distance % Change  0 %     Distance Feet Change  0 ft     Walk Time  6 minutes     # of Rest Breaks  0      MPH  2.65     METS  3     RPE  13     Perceived Dyspnea   12     VO2 Peak  14.03     Symptoms  No     Resting HR  54 bpm     Resting BP  140/80     Resting Oxygen Saturation   95 %     Exercise Oxygen Saturation  during 6 min walk  93 %     Max Ex. HR  98 bpm     Max Ex. BP  170/84     2 Minute Post BP  136/76        Oxygen Initial Assessment:   Oxygen Re-Evaluation:   Oxygen Discharge (Final Oxygen Re-Evaluation):   Initial Exercise Prescription: Initial Exercise Prescription - 08/24/17 1000      Date of Initial Exercise RX and Referring Provider   Date  08/24/17    Referring Provider  Dr. Larose Kells      Treadmill   MPH  2    Grade  0    Minutes  15    METs  2.5      Recumbant Elliptical   Level  1    RPM  50    Watts  55    Minutes  20    METs  3.5      Prescription Details   Frequency (times per week)  3    Duration  Progress to 30 minutes of continuous aerobic without signs/symptoms of physical distress      Intensity   THRR 40-80% of Max Heartrate  [phone]    Ratings of Perceived Exertion  11-13    Perceived Dyspnea  0-4      Progression   Progression  Continue progressive overload as per policy without signs/symptoms or physical distress.      Resistance Training   Training Prescription  Yes    Weight  1    Reps  10-15       Perform Capillary Blood Glucose checks as needed.  Exercise Prescription Changes:  Exercise Prescription Changes    Row Name 09/01/17 0700             Response to Exercise   Blood Pressure (Admit)  150/72       Blood Pressure (Exercise)  170/72       Blood Pressure (Exit)  142/70  Heart Rate (Admit)  61 bpm       Heart Rate (Exercise)  83 bpm       Heart Rate (Exit)  70 bpm       Rating of Perceived Exertion (Exercise)  10       Duration  Progress to 30 minutes of  aerobic without signs/symptoms of physical distress       Intensity  THRR New [phone]         Progression   Progression  Continue  to progress workloads to maintain intensity without signs/symptoms of physical distress.         Resistance Training   Training Prescription  Yes       Weight  2       Reps  10-15         Treadmill   MPH  2.4       Grade  0       Minutes  15       METs  2.8         Recumbant Elliptical   Level  2       RPM  70       Watts  96       Minutes  20       METs  5.6          Exercise Comments:  Exercise Comments    Row Name 09/01/17 [phone]           Exercise Comments  Patient is doing well in CR and has already progressed on both the treadmill and the recumbent elliptical. Patient has increased his speed to 2.4 and his level to two on the machines. Patient has still only just started CR and will be progressed more in time.          Exercise Goals and Review:  Exercise Goals    Row Name 08/24/17 1019             Exercise Goals   Increase Physical Activity  Yes       Intervention  Provide advice, education, support and counseling about physical activity/exercise needs.;Develop an individualized exercise prescription for aerobic and resistive training based on initial evaluation findings, risk stratification, comorbidities and participant's personal goals.       Expected Outcomes  Short Term: Attend rehab on a regular basis to increase amount of physical activity.       Increase Strength and Stamina  Yes       Intervention  Provide advice, education, support and counseling about physical activity/exercise needs.;Develop an individualized exercise prescription for aerobic and resistive training based on initial evaluation findings, risk stratification, comorbidities and participant's personal goals.       Expected Outcomes  Short Term: Increase workloads from initial exercise prescription for resistance, speed, and METs.       Able to understand and use rate of perceived exertion (RPE) scale  Yes       Intervention  Provide education and explanation on how to use RPE scale        Expected Outcomes  Short Term: Able to use RPE daily in rehab to express subjective intensity level;Long Term:  Able to use RPE to guide intensity level when exercising independently       Able to understand and use Dyspnea scale  Yes       Intervention  Provide education and explanation on how to use Dyspnea scale       Expected  Outcomes  Short Term: Able to use Dyspnea scale daily in rehab to express subjective sense of shortness of breath during exertion;Long Term: Able to use Dyspnea scale to guide intensity level when exercising independently       Knowledge and understanding of Target Heart Rate Range (THRR)  Yes       Intervention  Provide education and explanation of THRR including how the numbers were predicted and where they are located for reference       Expected Outcomes  Short Term: Able to state/look up THRR;Long Term: Able to use THRR to govern intensity when exercising independently;Short Term: Able to use daily as guideline for intensity in rehab       Able to check pulse independently  Yes       Intervention  Provide education and demonstration on how to check pulse in carotid and radial arteries.;Review the importance of being able to check your own pulse for safety during independent exercise       Expected Outcomes  Long Term: Able to check pulse independently and accurately;Short Term: Able to explain why pulse checking is important during independent exercise       Understanding of Exercise Prescription  Yes       Intervention  Provide education, explanation, and written materials on patient's individual exercise prescription       Expected Outcomes  Short Term: Able to explain program exercise prescription;Long Term: Able to explain home exercise prescription to exercise independently          Exercise Goals Re-Evaluation : Exercise Goals Re-Evaluation    Row Name 09/01/17 0752             Exercise Goal Re-Evaluation   Exercise Goals Review  Increase Physical  Activity;Able to understand and use rate of perceived exertion (RPE) scale;Increase Strength and Stamina;Knowledge and understanding of Target Heart Rate Range (THRR);Understanding of Exercise Prescription       Comments  Patient is doing well in CR and has already progressed on both the treadmill and the recumbent elliptical. Patient has increased his speed to 2.4 and his level to two on the machines. Patient has still only just started CR and will be progressed more in time.        Expected Outcomes  Patient wishes to get back to ADLs and to be able to swing a golf club and play golf again.            Discharge Exercise Prescription (Final Exercise Prescription Changes): Exercise Prescription Changes - 09/01/17 0700      Response to Exercise   Blood Pressure (Admit)  150/72    Blood Pressure (Exercise)  170/72    Blood Pressure (Exit)  142/70    Heart Rate (Admit)  61 bpm    Heart Rate (Exercise)  83 bpm    Heart Rate (Exit)  70 bpm    Rating of Perceived Exertion (Exercise)  10    Duration  Progress to 30 minutes of  aerobic without signs/symptoms of physical distress    Intensity  THRR New [phone]      Progression   Progression  Continue to progress workloads to maintain intensity without signs/symptoms of physical distress.      Resistance Training   Training Prescription  Yes    Weight  2    Reps  10-15      Treadmill   MPH  2.4    Grade  0    Minutes  15  METs  2.8      Recumbant Elliptical   Level  2    RPM  70    Watts  96    Minutes  20    METs  5.6       Nutrition:  Target Goals: Understanding of nutrition guidelines, daily intake of sodium 1500mg , cholesterol 200mg , calories 30% from fat and 7% or less from saturated fats, daily to have 5 or more servings of fruits and vegetables.  Biometrics: Pre Biometrics - 08/24/17 1019      Pre Biometrics   Height  6\' 1"  (1.854 m)    Weight  193 lb (87.5 kg)    Waist Circumference  38 inches    Hip  Circumference  39 inches    Waist to Hip Ratio  0.97 %    BMI (Calculated)  25.47    Triceps Skinfold  7 mm    % Body Fat  22.1 %    Grip Strength  80 kg    Flexibility  11.5 in    Single Leg Stand  4 seconds        Nutrition Therapy Plan and Nutrition Goals: Nutrition Therapy & Goals - 08/24/17 1132      Personal Nutrition Goals   Personal Goal #2  Patient is eating a very heart healthy diet. Low sodium, low fat, plenty for vegetables an healthy meats and fish.    Additional Goals?  No       Nutrition Assessments:   Nutrition Goals Re-Evaluation:   Nutrition Goals Discharge (Final Nutrition Goals Re-Evaluation):   Psychosocial: Target Goals: Acknowledge presence or absence of significant depression and/or stress, maximize coping skills, provide positive support system. Participant is able to verbalize types and ability to use techniques and skills needed for reducing stress and depression.  Initial Review & Psychosocial Screening: Initial Psych Review & Screening - 08/24/17 1135      Initial Review   Current issues with  None Identified      Family Dynamics   Good Support System?  Yes      Barriers   Psychosocial barriers to participate in program  There are no identifiable barriers or psychosocial needs.      Screening Interventions   Interventions  Encouraged to exercise    Expected Outcomes  Short Term goal: Identification and review with participant of any Quality of Life or Depression concerns found by scoring the questionnaire.;Long Term goal: The participant improves quality of Life and PHQ9 Scores as seen by post scores and/or verbalization of changes       Quality of Life Scores: Quality of Life - 08/24/17 1020      Quality of Life Scores   Health/Function Pre  26.72 %    Socioeconomic Pre  28.13 %    Psych/Spiritual Pre  24 %    Family Pre  30 %    GLOBAL Pre  27.74 %      Scores of 19 and below usually indicate a poorer quality of life in  these areas.  A difference of  2-3 points is a clinically meaningful difference.  A difference of 2-3 points in the total score of the Quality of Life Index has been associated with significant improvement in overall quality of life, self-image, physical symptoms, and general health in studies assessing change in quality of life.  PHQ-9: Recent Review Flowsheet Data    Depression screen [facility] 08/24/2017   Decreased Interest 0   Down, Depressed, Hopeless  0   PHQ - 2 Score 0   Altered sleeping 0   Tired, decreased energy 0   Change in appetite 0   Feeling bad or failure about yourself  0   Trouble concentrating 0   Moving slowly or fidgety/restless 0   Suicidal thoughts 0   PHQ-9 Score 0     Interpretation of Total Score  Total Score Depression Severity:  1-4 = Minimal depression, 5-9 = Mild depression, 10-14 = Moderate depression, 15-19 = Moderately severe depression, 20-27 = Severe depression   Psychosocial Evaluation and Intervention: Psychosocial Evaluation - 08/24/17 1136      Psychosocial Evaluation & Interventions   Interventions  Encouraged to exercise with the program and follow exercise prescription    Continue Psychosocial Services   No Follow up required       Psychosocial Re-Evaluation:   Psychosocial Discharge (Final Psychosocial Re-Evaluation):   Vocational Rehabilitation: Provide vocational rehab assistance to qualifying candidates.   Vocational Rehab Evaluation & Intervention: Vocational Rehab - 08/24/17 1103      Initial Vocational Rehab Evaluation & Intervention   Assessment shows need for Vocational Rehabilitation  No       Education: Education Goals: Education classes will be provided on a weekly basis, covering required topics. Participant will state understanding/return demonstration of topics presented.  Learning Barriers/Preferences: Learning Barriers/Preferences - 08/24/17 1103      Learning Barriers/Preferences   Learning Barriers  None     Learning Preferences  Verbal Instruction;Skilled Demonstration;Pictoral;Video       Education Topics: Hypertension, Hypertension Reduction -Define heart disease and high blood pressure. Discus how high blood pressure affects the body and ways to reduce high blood pressure.   Exercise and Your Heart -Discuss why it is important to exercise, the FITT principles of exercise, normal and abnormal responses to exercise, and how to exercise safely.   Angina -Discuss definition of angina, causes of angina, treatment of angina, and how to decrease risk of having angina.   Cardiac Medications -Review what the following cardiac medications are used for, how they affect the body, and side effects that may occur when taking the medications.  Medications include Aspirin, Beta blockers, calcium channel blockers, ACE Inhibitors, angiotensin receptor blockers, diuretics, digoxin, and antihyperlipidemics.   CARDIAC REHAB PHASE II EXERCISE from 08/26/2017 in [facility] CARDIAC REHABILITATION  Date  08/26/17  Educator  DC  Instruction Review Code  2- Demonstrated Understanding      Congestive Heart Failure -Discuss the definition of CHF, how to live with CHF, the signs and symptoms of CHF, and how keep track of weight and sodium intake.   Heart Disease and Intimacy -Discus the effect sexual activity has on the heart, how changes occur during intimacy as we age, and safety during sexual activity.   Smoking Cessation / COPD -Discuss different methods to quit smoking, the health benefits of quitting smoking, and the definition of COPD.   Nutrition I: Fats -Discuss the types of cholesterol, what cholesterol does to the heart, and how cholesterol levels can be controlled.   Nutrition II: Labels -Discuss the different components of food labels and how to read food label   Heart Parts/Heart Disease and PAD -Discuss the anatomy of the heart, the pathway of blood circulation through the heart,  and these are affected by heart disease.   Stress I: Signs and Symptoms -Discuss the causes of stress, how stress may lead to anxiety and depression, and ways to limit stress.   Stress II: Relaxation -  Discuss different types of relaxation techniques to limit stress.   Warning Signs of Stroke / TIA -Discuss definition of a stroke, what the signs and symptoms are of a stroke, and how to identify when someone is having stroke.   Knowledge Questionnaire Score: Knowledge Questionnaire Score - 08/24/17 1103      Knowledge Questionnaire Score   Pre Score  22/24       Core Components/Risk Factors/Patient Goals at Admission: Personal Goals and Risk Factors at Admission - 08/24/17 1133      Core Components/Risk Factors/Patient Goals on Admission    Weight Management  Weight Maintenance    Tobacco Cessation  -- Has already quit smoking cigarettes and cigars.     Personal Goal Other  Yes    Personal Goal  Get back to ADL's. amd playing golf.     Intervention  Attend CR 3 x week and supplement with home exercise 2 days week.     Expected Outcomes  Achieve personal goals.        Core Components/Risk Factors/Patient Goals Review:    Core Components/Risk Factors/Patient Goals at Discharge (Final Review):    ITP Comments: ITP Comments    Row Name 08/24/17 1130 09/02/17 0756         ITP Comments  Mr. Stordahl is a pleasant 62 year old man. [facility] sometimes twice a day. Patient is eager to get started.   Patient new to program. He has completed 4 sessions. Will continue to monitor for progress.          Comments: ITP 30 Day REVIEW Patient new to program. He has completed 4 sessions. Will continue to monitor for progress

## 2017-09-02 NOTE — Progress Notes (Signed)
Daily Session Note  Patient Details  Name: Curtis Sherman MRN: [id number] Date of Birth: Nov 27, 1955 Referring Provider:     CARDIAC REHAB PHASE II ORIENTATION from 08/24/2017 in [place]  Referring Provider  Dr. [name]      Encounter Date: 09/02/2017  Check In: Session Check In - 09/02/17 1008      Check-In   Location  [facility]    Staff Present  Diane Angelina Pih, MS, EP, [facility], Exercise Physiologist;Latavius Capizzi Luther Parody, BS, EP, Exercise Physiologist;Debra Wynetta Emery, RN, BSN    Supervising physician immediately available to respond to emergencies  See telemetry face sheet for immediately available MD    Medication changes reported      No    Fall or balance concerns reported     No    Tobacco Cessation  No Change    Warm-up and Cool-down  Performed as group-led instruction    Resistance Training Performed  Yes    VAD Patient?  No      Pain Assessment   Currently in Pain?  No/denies    Pain Score  0-No pain    Multiple Pain Sites  No       Capillary Blood Glucose: No results found for this or any previous visit (from the past 24 hour(s)).    Social History   Tobacco Use  Smoking Status Former Smoker  . Packs/day: 1.50  . Years: 43.00  . Pack years: 64.50  . Types: Cigars, Cigarettes  . Last attempt to quit: 07/30/2013  . Years since quitting: 4.0  Smokeless Tobacco Never Used    Goals Met:  Independence with exercise equipment Exercise tolerated well No report of cardiac concerns or symptoms Strength training completed today  Goals Unmet:  Not Applicable  Comments: Check out 1030   Dr. Kate Sable is Medical Director for [place] and Pulmonary Rehab.

## 2017-09-04 ENCOUNTER — Encounter (HOSPITAL_COMMUNITY): Payer: Medicare HMO

## 2017-09-07 ENCOUNTER — Encounter (HOSPITAL_COMMUNITY)
Admission: RE | Admit: 2017-09-07 | Discharge: 2017-09-07 | Disposition: A | Discharge status: Home / Self Care | Payer: Medicare HMO | Source: Ambulatory Visit | Attending: Cardiology | Admitting: Cardiology

## 2017-09-07 DIAGNOSIS — I213 ST elevation (STEMI) myocardial infarction of unspecified site: Secondary | ICD-10-CM

## 2017-09-07 DIAGNOSIS — Z951 Presence of aortocoronary bypass graft: Secondary | ICD-10-CM | POA: Diagnosis not present

## 2017-09-07 NOTE — Progress Notes (Signed)
Daily Session Note  Patient Details  Name: Morgon Pamer MRN: [id number] Date of Birth: [birth year] Referring Provider:     CARDIAC REHAB PHASE II ORIENTATION from 08/24/2017 in [place]  Referring Provider  Dr. [name]      Encounter Date: 09/07/2017  Check In: Session Check In - 09/07/17 0930      Check-In   Location  [facility]    Staff Present  Suzanne Boron, BS, EP, Exercise Physiologist;Katy Brickell Wynetta Emery, RN, BSN    Supervising physician immediately available to respond to emergencies  See telemetry face sheet for immediately available MD    Medication changes reported      No    Fall or balance concerns reported     No    Warm-up and Cool-down  Performed as group-led instruction    Resistance Training Performed  Yes    VAD Patient?  No      Pain Assessment   Currently in Pain?  No/denies    Pain Score  0-No pain    Multiple Pain Sites  No       Capillary Blood Glucose: No results found for this or any previous visit (from the past 24 hour(s)).    Social History   Tobacco Use  Smoking Status Former Smoker  . Packs/day: 1.50  . Years: 43.00  . Pack years: 64.50  . Types: Cigars, Cigarettes  . Last attempt to quit: 07/30/2013  . Years since quitting: 4.1  Smokeless Tobacco Never Used    Goals Met:  Independence with exercise equipment Exercise tolerated well No report of cardiac concerns or symptoms Strength training completed today  Goals Unmet:  Not Applicable  Comments: Check out 1030.   Dr. Kate Sable is Medical Director for [facility] Cardiac and Pulmonary Rehab.

## 2017-09-09 ENCOUNTER — Encounter (HOSPITAL_COMMUNITY)
Admission: RE | Admit: 2017-09-09 | Discharge: 2017-09-09 | Disposition: A | Discharge status: Home / Self Care | Payer: Medicare HMO | Source: Ambulatory Visit | Attending: Cardiology | Admitting: Cardiology

## 2017-09-09 DIAGNOSIS — I213 ST elevation (STEMI) myocardial infarction of unspecified site: Secondary | ICD-10-CM

## 2017-09-09 DIAGNOSIS — Z951 Presence of aortocoronary bypass graft: Secondary | ICD-10-CM

## 2017-09-09 NOTE — Progress Notes (Addendum)
Daily Session Note  Patient Details  Name: Curtis Sherman MRN: [id number] Date of Birth: August 10, 1955 Referring Provider:     CARDIAC REHAB PHASE II ORIENTATION from 08/24/2017 in [place]  Referring Provider  Dr. [name]      Encounter Date: 09/09/2017  Check In: Session Check In - 09/09/17 0930      Check-In   Location  [facility]    Staff Present  Suzanne Boron, BS, EP, Exercise Physiologist;Lynnex Fulp Wynetta Emery, RN, BSN    Supervising physician immediately available to respond to emergencies  See telemetry face sheet for immediately available MD    Medication changes reported      Yes    Comments  Cardiologist increased Lisonopril from 5 mg to 10 mg daily due elevated blood pressure since last visit.     Fall or balance concerns reported     No    Warm-up and Cool-down  Performed as group-led instruction    Resistance Training Performed  Yes    VAD Patient?  No      Pain Assessment   Currently in Pain?  No/denies    Pain Score  0-No pain    Multiple Pain Sites  No       Capillary Blood Glucose: No results found for this or any previous visit (from the past 24 hour(s)).    Social History   Tobacco Use  Smoking Status Former Smoker  . Packs/day: 1.50  . Years: 43.00  . Pack years: 64.50  . Types: Cigars, Cigarettes  . Last attempt to quit: 07/30/2013  . Years since quitting: 4.1  Smokeless Tobacco Never Used    Goals Met:  Independence with exercise equipment Exercise tolerated well No report of cardiac concerns or symptoms Strength training completed today  Goals Unmet:  Not Applicable  Comments: Check out 1030.   Dr. Kate Sable is Medical Director for [facility] Cardiac and Pulmonary Rehab.

## 2017-09-11 ENCOUNTER — Encounter (HOSPITAL_COMMUNITY)
Admission: RE | Admit: 2017-09-11 | Discharge: 2017-09-11 | Disposition: A | Discharge status: Home / Self Care | Payer: Medicare HMO | Source: Ambulatory Visit | Attending: Cardiology | Admitting: Cardiology

## 2017-09-11 ENCOUNTER — Encounter (HOSPITAL_COMMUNITY): Payer: Medicare HMO

## 2017-09-11 DIAGNOSIS — Z951 Presence of aortocoronary bypass graft: Secondary | ICD-10-CM | POA: Diagnosis not present

## 2017-09-11 DIAGNOSIS — I213 ST elevation (STEMI) myocardial infarction of unspecified site: Secondary | ICD-10-CM

## 2017-09-11 NOTE — Progress Notes (Signed)
Daily Session Note  Patient Details  Name: Curtis Sherman MRN: [id number] Date of Birth: 01/19/1956 Referring Provider:     CARDIAC REHAB PHASE II ORIENTATION from 08/24/2017 in [facility]  Referring Provider  Dr. Paeaschos      Encounter Date: 09/11/2017  Check In: Session Check In - 09/11/17 0930      Check-In   Location  [facility]    Staff Present  Diane Coad, MS, EP, [facility], Exercise Physiologist;Gregory Cowan, BS, EP, Exercise Physiologist;Debra Johnson, RN, BSN    Supervising physician immediately available to respond to emergencies  See telemetry face sheet for immediately available MD    Medication changes reported      No    Fall or balance concerns reported     No    Warm-up and Cool-down  Performed as group-led instruction    Resistance Training Performed  Yes    VAD Patient?  No      Pain Assessment   Currently in Pain?  No/denies    Pain Score  0-No pain    Multiple Pain Sites  No       Capillary Blood Glucose: No results found for this or any previous visit (from the past 24 hour(s)).    Social History   Tobacco Use  Smoking Status Former Smoker  . Packs/day: 1.50  . Years: 43.00  . Pack years: 64.50  . Types: Cigars, Cigarettes  . Last attempt to quit: 07/30/2013  . Years since quitting: 4.1  Smokeless Tobacco Never Used    Goals Met:  Independence with exercise equipment Exercise tolerated well No report of cardiac concerns or symptoms Strength training completed today  Goals Unmet:  Not Applicable  Comments: Check out 1030.   Dr. Suresh Koneswaran is Medical Director for [facility]. 

## 2017-09-14 ENCOUNTER — Encounter (HOSPITAL_COMMUNITY)
Admission: RE | Admit: 2017-09-14 | Discharge: 2017-09-14 | Disposition: A | Discharge status: Home / Self Care | Payer: Medicare HMO | Source: Ambulatory Visit | Attending: Cardiology | Admitting: Cardiology

## 2017-09-14 DIAGNOSIS — I213 ST elevation (STEMI) myocardial infarction of unspecified site: Secondary | ICD-10-CM

## 2017-09-14 DIAGNOSIS — Z951 Presence of aortocoronary bypass graft: Secondary | ICD-10-CM

## 2017-09-14 NOTE — Progress Notes (Signed)
Daily Session Note  Patient Details  Name: Curtis Sherman MRN: [id number] Date of Birth: Dec 24, 1955 Referring Provider:     CARDIAC REHAB PHASE II ORIENTATION from 08/24/2017 in [place]  Referring Provider  Dr. [name]      Encounter Date: 09/14/2017  Check In: Session Check In - 09/14/17 0942      Check-In   Location  [facility]    Staff Present  Diane Angelina Pih, MS, EP, [facility], Exercise Physiologist;Para Cossey Luther Parody, BS, EP, Exercise Physiologist;Debra Wynetta Emery, RN, BSN    Supervising physician immediately available to respond to emergencies  See telemetry face sheet for immediately available MD    Medication changes reported      No    Fall or balance concerns reported     No    Tobacco Cessation  No Change    Warm-up and Cool-down  Performed as group-led instruction    Resistance Training Performed  Yes    VAD Patient?  No      Pain Assessment   Currently in Pain?  No/denies    Pain Score  0-No pain    Multiple Pain Sites  No       Capillary Blood Glucose: No results found for this or any previous visit (from the past 24 hour(s)).    Social History   Tobacco Use  Smoking Status Former Smoker  . Packs/day: 1.50  . Years: 43.00  . Pack years: 64.50  . Types: Cigars, Cigarettes  . Last attempt to quit: 07/30/2013  . Years since quitting: 4.1  Smokeless Tobacco Never Used    Goals Met:  Independence with exercise equipment Exercise tolerated well No report of cardiac concerns or symptoms Strength training completed today  Goals Unmet:  Not Applicable  Comments: Check out 1030   Dr. Kate Sable is Medical Director for [place] and [facility].

## 2017-09-16 ENCOUNTER — Encounter (HOSPITAL_COMMUNITY)
Admission: RE | Admit: 2017-09-16 | Discharge: 2017-09-16 | Disposition: A | Discharge status: Home / Self Care | Payer: Medicare HMO | Source: Ambulatory Visit | Attending: Cardiology | Admitting: Cardiology

## 2017-09-16 DIAGNOSIS — I213 ST elevation (STEMI) myocardial infarction of unspecified site: Secondary | ICD-10-CM

## 2017-09-16 DIAGNOSIS — Z951 Presence of aortocoronary bypass graft: Secondary | ICD-10-CM

## 2017-09-16 NOTE — Progress Notes (Signed)
Daily Session Note  Patient Details  Name: Curtis Sherman MRN: [id number] Date of Birth: 03/16/1956 Referring Provider:     CARDIAC REHAB PHASE II ORIENTATION from 08/24/2017 in [place] [facility]  Referring Provider  Dr. Paeaschos      Encounter Date: 09/16/2017  Check In: Session Check In - 09/16/17 0930      Check-In   Location  [facility]    Staff Present  Diane Coad, MS, EP, [facility], Exercise Physiologist;Gregory Cowan, BS, EP, Exercise Physiologist;Debra Johnson, RN, BSN    Supervising physician immediately available to respond to emergencies  See telemetry face sheet for immediately available MD    Medication changes reported      No    Fall or balance concerns reported     No    Warm-up and Cool-down  Performed as group-led instruction    Resistance Training Performed  Yes    VAD Patient?  No      Pain Assessment   Currently in Pain?  No/denies    Pain Score  0-No pain    Multiple Pain Sites  No       Capillary Blood Glucose: No results found for this or any previous visit (from the past 24 hour(s)).  Exercise Prescription Changes - 09/15/17 1500      Response to Exercise   Blood Pressure (Admit)  154/76    Blood Pressure (Exercise)  172/70    Blood Pressure (Exit)  134/68    Heart Rate (Admit)  54 bpm    Heart Rate (Exercise)  77 bpm    Heart Rate (Exit)  63 bpm    Rating of Perceived Exertion (Exercise)  11    Duration  Progress to 30 minutes of  aerobic without signs/symptoms of physical distress    Intensity  THRR New [id number]      Progression   Progression  Continue to progress workloads to maintain intensity without signs/symptoms of physical distress.      Resistance Training   Training Prescription  Yes    Weight  3    Reps  10-15      Treadmill   MPH  2.8    Grade  0    Minutes  20    METs  3.1      Recumbant Elliptical   Level  2    RPM  78    Watts  105    Minutes  15    METs  6      Home Exercise  Plan   Plans to continue exercise at  Home (comment)    Frequency  Add 2 additional days to program exercise sessions.    Initial Home Exercises Provided  09/02/17       Social History   Tobacco Use  Smoking Status Former Smoker  . Packs/day: 1.50  . Years: 43.00  . Pack years: 64.50  . Types: Cigars, Cigarettes  . Last attempt to quit: 07/30/2013  . Years since quitting: 4.1  Smokeless Tobacco Never Used    Goals Met:  Independence with exercise equipment Exercise tolerated well No report of cardiac concerns or symptoms Strength training completed today  Goals Unmet:  Not Applicable  Comments: Check out 1030.   Dr. Suresh Koneswaran is Medical Director for [facility]. 

## 2017-09-18 ENCOUNTER — Encounter (HOSPITAL_COMMUNITY)
Admission: RE | Admit: 2017-09-18 | Discharge: 2017-09-18 | Disposition: A | Discharge status: Home / Self Care | Payer: Medicare HMO | Source: Ambulatory Visit | Attending: Cardiology | Admitting: Cardiology

## 2017-09-18 DIAGNOSIS — Z951 Presence of aortocoronary bypass graft: Secondary | ICD-10-CM | POA: Diagnosis not present

## 2017-09-18 NOTE — Progress Notes (Signed)
Daily Session Note  Patient Details  Name: Curtis Sherman MRN: [id number] Date of Birth: Oct 06, 1955 Referring Provider:     CARDIAC REHAB PHASE II ORIENTATION from 08/24/2017 in [place]  Referring Provider  Dr. [name]      Encounter Date: 09/18/2017  Check In: Session Check In - 09/18/17 0930      Check-In   Location  [facility]    Staff Present  Russella Dar, MS, EP, [facility], Exercise Physiologist;Debra Wynetta Emery, RN, BSN    Supervising physician immediately available to respond to emergencies  See telemetry face sheet for immediately available MD    Medication changes reported      No    Fall or balance concerns reported     No    Tobacco Cessation  No Change    Warm-up and Cool-down  Performed as group-led instruction    Resistance Training Performed  Yes    VAD Patient?  No      Pain Assessment   Currently in Pain?  No/denies    Pain Score  0-No pain    Multiple Pain Sites  No       Capillary Blood Glucose: No results found for this or any previous visit (from the past 24 hour(s)).    Social History   Tobacco Use  Smoking Status Former Smoker  . Packs/day: 1.50  . Years: 43.00  . Pack years: 64.50  . Types: Cigars, Cigarettes  . Last attempt to quit: 07/30/2013  . Years since quitting: 4.1  Smokeless Tobacco Never Used    Goals Met:  Independence with exercise equipment Exercise tolerated well No report of cardiac concerns or symptoms Strength training completed today  Goals Unmet:  Not Applicable  Comments: Check out: 1030   Dr. Kate Sable is Medical Director for [place] and [facility] Re[facility].

## 2017-09-21 ENCOUNTER — Encounter (HOSPITAL_COMMUNITY)
Admission: RE | Admit: 2017-09-21 | Discharge: 2017-09-21 | Disposition: A | Discharge status: Home / Self Care | Payer: Medicare HMO | Source: Ambulatory Visit | Attending: Cardiology | Admitting: Cardiology

## 2017-09-21 DIAGNOSIS — Z951 Presence of aortocoronary bypass graft: Secondary | ICD-10-CM | POA: Diagnosis not present

## 2017-09-21 NOTE — Progress Notes (Signed)
Daily Session Note  Patient Details  Name: Curtis Sherman MRN: [id number] Date of Birth: [birth year] Referring Provider:     CARDIAC REHAB PHASE II ORIENTATION from 08/24/2017 in [place]  Referring Provider  Dr. [name]      Encounter Date: 09/21/2017  Check In: Session Check In - 09/21/17 0930      Check-In   Location  [facility]    Staff Present  Russella Dar, MS, EP, [facility], Exercise Physiologist;Debra Wynetta Emery, RN, BSN    Supervising physician immediately available to respond to emergencies  See telemetry face sheet for immediately available MD    Medication changes reported      No    Fall or balance concerns reported     No    Warm-up and Cool-down  Performed as group-led instruction    Resistance Training Performed  Yes    VAD Patient?  No      Pain Assessment   Pain Score  0-No pain    Multiple Pain Sites  No       Capillary Blood Glucose: No results found for this or any previous visit (from the past 24 hour(s)).    Social History   Tobacco Use  Smoking Status Former Smoker  . Packs/day: 1.50  . Years: 43.00  . Pack years: 64.50  . Types: Cigars, Cigarettes  . Last attempt to quit: 07/30/2013  . Years since quitting: 4.1  Smokeless Tobacco Never Used    Goals Met:  Independence with exercise equipment Exercise tolerated well No report of cardiac concerns or symptoms Strength training completed today  Goals Unmet:  Not Applicable  Comments: Check out: 10:30   Dr. Kate Sable is Medical Director for [place] and Pulmonary Re[facility].

## 2017-09-23 ENCOUNTER — Encounter (HOSPITAL_COMMUNITY)
Admission: RE | Admit: 2017-09-23 | Discharge: 2017-09-23 | Disposition: A | Discharge status: Home / Self Care | Payer: Medicare HMO | Source: Ambulatory Visit | Attending: Cardiology | Admitting: Cardiology

## 2017-09-23 DIAGNOSIS — I213 ST elevation (STEMI) myocardial infarction of unspecified site: Secondary | ICD-10-CM

## 2017-09-23 DIAGNOSIS — Z951 Presence of aortocoronary bypass graft: Secondary | ICD-10-CM

## 2017-09-23 NOTE — Progress Notes (Signed)
Daily Session Note  Patient Details  Name: Curtis Sherman MRN: [id number] Date of Birth: 02-Nov-1955 Referring Provider:     CARDIAC REHAB PHASE II ORIENTATION from 08/24/2017 in [place]  Referring Provider  Dr. [name]      Encounter Date: 09/23/2017  Check In: Session Check In - 09/23/17 0944      Check-In   Location  [facility]    Staff Present  Russella Dar, MS, EP, [facility], Exercise Physiologist;Debra Wynetta Emery, RN, BSN;Dartha Rozzell, BS, EP, Exercise Physiologist    Supervising physician immediately available to respond to emergencies  See telemetry face sheet for immediately available MD    Medication changes reported      No    Fall or balance concerns reported     No    Tobacco Cessation  No Change    Warm-up and Cool-down  Performed as group-led instruction    Resistance Training Performed  Yes    VAD Patient?  No      Pain Assessment   Currently in Pain?  No/denies    Pain Score  0-No pain    Multiple Pain Sites  No       Capillary Blood Glucose: No results found for this or any previous visit (from the past 24 hour(s)).    Social History   Tobacco Use  Smoking Status Former Smoker  . Packs/day: 1.50  . Years: 43.00  . Pack years: 64.50  . Types: Cigars, Cigarettes  . Last attempt to quit: 07/30/2013  . Years since quitting: 4.1  Smokeless Tobacco Never Used    Goals Met:  Independence with exercise equipment Exercise tolerated well No report of cardiac concerns or symptoms Strength training completed today  Goals Unmet:  Not Applicable  Comments: Check out 1030   Dr. Kate Sable is Medical Director for [place] and Pulmonary Rehab.

## 2017-09-25 ENCOUNTER — Encounter (HOSPITAL_COMMUNITY)
Admission: RE | Admit: 2017-09-25 | Discharge: 2017-09-25 | Disposition: A | Discharge status: Home / Self Care | Payer: Medicare HMO | Source: Ambulatory Visit | Attending: Cardiology | Admitting: Cardiology

## 2017-09-25 DIAGNOSIS — Z951 Presence of aortocoronary bypass graft: Secondary | ICD-10-CM | POA: Diagnosis not present

## 2017-09-25 DIAGNOSIS — I213 ST elevation (STEMI) myocardial infarction of unspecified site: Secondary | ICD-10-CM

## 2017-09-25 NOTE — Progress Notes (Signed)
Daily Session Note  Patient Details  Name: Curtis Sherman MRN: [id number] Date of Birth: 10/05/1955 Referring Provider:     CARDIAC REHAB PHASE II ORIENTATION from 08/24/2017 in [place] [facility]  Referring Provider  Dr. Paeaschos      Encounter Date: 09/25/2017  Check In: Session Check In - 09/25/17 0956      Check-In   Location  [facility]    Staff Present  Diane Coad, MS, EP, [facility], Exercise Physiologist;Debra Johnson, RN, BSN;Shota Kohrs, BS, EP, Exercise Physiologist    Supervising physician immediately available to respond to emergencies  See telemetry face sheet for immediately available MD    Medication changes reported      No    Fall or balance concerns reported     No    Tobacco Cessation  No Change    Resistance Training Performed  Yes    VAD Patient?  No      Pain Assessment   Currently in Pain?  No/denies    Pain Score  0-No pain    Multiple Pain Sites  No       Capillary Blood Glucose: No results found for this or any previous visit (from the past 24 hour(s)).  Exercise Prescription Changes - 09/24/17 1400      Response to Exercise   Blood Pressure (Admit)  120/62    Blood Pressure (Exercise)  162/68    Blood Pressure (Exit)  142/72    Heart Rate (Admit)  60 bpm    Heart Rate (Exercise)  77 bpm    Heart Rate (Exit)  68 bpm    Rating of Perceived Exertion (Exercise)  11    Duration  Progress to 30 minutes of  aerobic without signs/symptoms of physical distress    Intensity  THRR New [id number]      Progression   Progression  Continue to progress workloads to maintain intensity without signs/symptoms of physical distress.      Resistance Training   Training Prescription  Yes    Weight  4    Reps  10-15      Treadmill   MPH  2.9    Grade  0    Minutes  20    METs  3.2      Recumbant Elliptical   Level  3    RPM  68    Watts  99    Minutes  15    METs  5.3      Home Exercise Plan   Plans to continue  exercise at  Home (comment)    Frequency  Add 2 additional days to program exercise sessions.    Initial Home Exercises Provided  09/02/17       Social History   Tobacco Use  Smoking Status Former Smoker  . Packs/day: 1.50  . Years: 43.00  . Pack years: 64.50  . Types: Cigars, Cigarettes  . Last attempt to quit: 07/30/2013  . Years since quitting: 4.1  Smokeless Tobacco Never Used    Goals Met:  Independence with exercise equipment Exercise tolerated well No report of cardiac concerns or symptoms Strength training completed today  Goals Unmet:  Not Applicable  Comments: Check out 1030   Dr. Suresh Koneswaran is Medical Director for [place] [place] [facility]. 

## 2017-09-28 ENCOUNTER — Encounter: Payer: Self-pay | Admitting: Emergency Medicine

## 2017-09-28 ENCOUNTER — Emergency Department: Payer: Medicare HMO

## 2017-09-28 ENCOUNTER — Other Ambulatory Visit: Payer: Self-pay

## 2017-09-28 ENCOUNTER — Encounter (HOSPITAL_COMMUNITY)
Admission: RE | Admit: 2017-09-28 | Discharge: 2017-09-28 | Disposition: A | Discharge status: Home / Self Care | Payer: Medicare HMO | Source: Ambulatory Visit | Attending: Cardiology | Admitting: Cardiology

## 2017-09-28 DIAGNOSIS — Z951 Presence of aortocoronary bypass graft: Secondary | ICD-10-CM | POA: Insufficient documentation

## 2017-09-28 DIAGNOSIS — I252 Old myocardial infarction: Secondary | ICD-10-CM | POA: Insufficient documentation

## 2017-09-28 DIAGNOSIS — Z7982 Long term (current) use of aspirin: Secondary | ICD-10-CM | POA: Insufficient documentation

## 2017-09-28 DIAGNOSIS — Z79891 Long term (current) use of opiate analgesic: Secondary | ICD-10-CM | POA: Insufficient documentation

## 2017-09-28 DIAGNOSIS — Z79899 Other long term (current) drug therapy: Secondary | ICD-10-CM | POA: Diagnosis not present

## 2017-09-28 DIAGNOSIS — I10 Essential (primary) hypertension: Secondary | ICD-10-CM | POA: Insufficient documentation

## 2017-09-28 DIAGNOSIS — I213 ST elevation (STEMI) myocardial infarction of unspecified site: Secondary | ICD-10-CM

## 2017-09-28 DIAGNOSIS — K219 Gastro-esophageal reflux disease without esophagitis: Secondary | ICD-10-CM | POA: Diagnosis not present

## 2017-09-28 DIAGNOSIS — Z48812 Encounter for surgical aftercare following surgery on the circulatory system: Secondary | ICD-10-CM | POA: Insufficient documentation

## 2017-09-28 DIAGNOSIS — Z87891 Personal history of nicotine dependence: Secondary | ICD-10-CM | POA: Insufficient documentation

## 2017-09-28 DIAGNOSIS — Z7902 Long term (current) use of antithrombotics/antiplatelets: Secondary | ICD-10-CM | POA: Insufficient documentation

## 2017-09-28 DIAGNOSIS — R079 Chest pain, unspecified: Secondary | ICD-10-CM | POA: Diagnosis present

## 2017-09-28 LAB — TROPONIN I: Troponin I: <0.03 ng/mL (ref <0.03)

## 2017-09-28 LAB — BASIC METABOLIC PANEL
Anion gap: 7 (ref 5–15)
BUN: 11 mg/dL (ref 6–20)
CHLORIDE: 105 mmol/L (ref 101–111)
CO2: 30 mmol/L (ref 22–32)
CREATININE: 0.82 mg/dL (ref 0.61–1.24)
Calcium: 9.9 mg/dL (ref 8.9–10.3)
GFR calc Af Amer: >60 mL/min (ref >60)
GFR calc non Af Amer: >60 mL/min (ref >60)
GLUCOSE: 111 mg/dL — AB (ref 65–99)
Potassium: 4.2 mmol/L (ref 3.5–5.1)
Sodium: 142 mmol/L (ref 135–145)

## 2017-09-28 LAB — CBC
HEMATOCRIT: 46.1 % (ref 40.0–52.0)
Hemoglobin: 15.2 g/dL (ref 13.0–18.0)
MCH: 29.9 pg (ref 26.0–34.0)
MCHC: 33 g/dL (ref 32.0–36.0)
MCV: 90.7 fL (ref 80.0–100.0)
PLATELETS: 150 10*3/uL (ref 150–440)
RBC: 5.09 MIL/uL (ref 4.40–5.90)
RDW: 13.4 % (ref 11.5–14.5)
WBC: 6.4 10*3/uL (ref 3.8–10.6)

## 2017-09-28 NOTE — Progress Notes (Signed)
Daily Session Note  Patient Details  Name: Curtis Sherman MRN: [id number] Date of Birth: 1955-08-31 Referring Provider:     CARDIAC REHAB PHASE II ORIENTATION from 08/24/2017 in [place]  Referring Provider  Dr. [name]      Encounter Date: 09/28/2017  Check In: Session Check In - 09/28/17 1002      Check-In   Location  [facility]    Staff Present  Diane Angelina Pih, MS, EP, [facility], Exercise Physiologist;Debra Wynetta Emery, RN, BSN;Jemal Miskell, BS, EP, Exercise Physiologist    Supervising physician immediately available to respond to emergencies  See telemetry face sheet for immediately available MD    Medication changes reported      No    Fall or balance concerns reported     No    Tobacco Cessation  No Change    Warm-up and Cool-down  Performed as group-led instruction    Resistance Training Performed  Yes    VAD Patient?  No      Pain Assessment   Currently in Pain?  No/denies    Pain Score  0-No pain    Multiple Pain Sites  No       Capillary Blood Glucose: No results found for this or any previous visit (from the past 24 hour(s)).    Social History   Tobacco Use  Smoking Status Former Smoker  . Packs/day: 1.50  . Years: 43.00  . Pack years: 64.50  . Types: Cigars, Cigarettes  . Last attempt to quit: 07/30/2013  . Years since quitting: 4.1  Smokeless Tobacco Never Used    Goals Met:  Independence with exercise equipment Exercise tolerated well No report of cardiac concerns or symptoms Strength training completed today  Goals Unmet:  Not Applicable  Comments: Check out 1030   Dr. Kate Sable is Medical Director for [place] and Pulmonary Rehab.

## 2017-09-28 NOTE — ED Triage Notes (Signed)
Pt reports that he was in cardiac rehab today and had elevated B/P, he reports that he is having chest pain that is going down his left arm. 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

## 2017-09-29 ENCOUNTER — Emergency Department
Admission: EM | Admit: 2017-09-29 | Discharge: 2017-09-29 | Disposition: A | Discharge status: Home / Self Care | Payer: Medicare HMO | Attending: Emergency Medicine | Admitting: Emergency Medicine

## 2017-09-29 DIAGNOSIS — R079 Chest pain, unspecified: Secondary | ICD-10-CM

## 2017-09-29 DIAGNOSIS — I10 Essential (primary) hypertension: Secondary | ICD-10-CM

## 2017-09-29 LAB — TROPONIN I: Troponin I: <0.03 ng/mL (ref <0.03)

## 2017-09-29 MED ORDER — MORPHINE SULFATE (PF) 2 MG/ML IV SOLN
2.0000 mg | Freq: Once | INTRAVENOUS | Status: AC
  Administered 2017-09-29: 2 mg via INTRAVENOUS
  Filled 2017-09-29: qty 1

## 2017-09-29 MED ORDER — ONDANSETRON HCL 4 MG/2ML IJ SOLN
4.0000 mg | Freq: Once | INTRAMUSCULAR | Status: AC
  Administered 2017-09-29: 4 mg via INTRAVENOUS
  Filled 2017-09-29: qty 2

## 2017-09-29 MED ORDER — CLONIDINE HCL 0.1 MG PO TABS
0.1000 mg | ORAL_TABLET | Freq: Once | ORAL | Status: AC
  Administered 2017-09-29: 0.1 mg via ORAL
  Filled 2017-09-29: qty 1

## 2017-09-29 NOTE — ED Provider Notes (Signed)
[facility] Emergency Department Provider Note   First MD Initiated Contact with Patient 09/29/17 0036     (approximate)  I have reviewed the triage vital signs and the nursing notes.   HISTORY  Chief Complaint Hypertension; Headache; and Chest Pain    HPI Landers Prajapati is a 62 y.o. male with below list of chronic medical conditions including STEMI 06/17/2017 status post 4 vessel CABG.  Patient catheterization revealed also a 60 and 50% RCA occlusion.  Patient presents to the emergency department with central chest tightness radiating to the left arm with markedly elevated blood pressure.  Patient states that since his myocardial infarction he has had markedly elevated blood pressure and as such metoprolol and subsequently lisinopril was initiated however that has not achieve blood pressure control.  Patient states that symptoms started this morning in regards to chest tightness while at cardiac rehab.  Patient states that chest tightness continues at this time with her current pain score 6 out of 10.  She denies any dyspnea no diaphoresis no nausea or vomiting.  Patient does also admit to a headache  Past Medical History:  Diagnosis Date  . GERD (gastroesophageal reflux disease)   . High cholesterol   . STEMI (ST elevation myocardial infarction) (HCC) 06/17/2017    Patient Active Problem List   Diagnosis Date Noted  . 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 STEMI (ST elevation myocardial infarction) (HCC) 06/18/2017  . Chest pain, unspecified 06/17/2017  . GERD (gastroesophageal reflux disease) 06/17/2017  . Hyperlipidemia, unspecified 06/17/2017  . HTN (hypertension) 06/17/2017  . Angina at rest [facility]) 06/17/2017  . Chronic back pain 01/31/2015  . Diet-controlled type 2 diabetes mellitus (HCC) 08/01/2014    Past Surgical History:  Procedure Laterality Date  . APPENDECTOMY    . BACK SURGERY    . CORONARY ARTERY BYPASS GRAFT N/A 06/19/2017   Procedure:  CORONARY ARTERY BYPASS GRAFTING (CABG) x 3 WITH RIGHT ENDOVEIN HARVESTING;  Surgeon: Delight Ovens, MD;  Location: [facility] OR;  Service: Open Heart Surgery;  Laterality: N/A;  . CORONARY/GRAFT ACUTE MI REVASCULARIZATION N/A 06/17/2017   Procedure: Coronary Angiography;  Surgeon: Marcina Millard, MD;  Location: [facility] INVASIVE CV LAB;  Service: Cardiovascular;  Laterality: N/A;  . ESOPHAGOGASTRODUODENOSCOPY (EGD) WITH PROPOFOL N/A 12/25/2014   Procedure: ESOPHAGOGASTRODUODENOSCOPY (EGD) WITH PROPOFOL;  Surgeon: Scot Jun, MD;  Location: [facility] ENDOSCOPY;  Service: Endoscopy;  Laterality: N/A;  . FRACTURE SURGERY    . HERNIA REPAIR    . LEFT HEART CATH AND CORONARY ANGIOGRAPHY N/A 06/17/2017   Procedure: LEFT HEART CATH;  Surgeon: Marcina Millard, MD;  Location: [facility] INVASIVE CV LAB;  Service: Cardiovascular;  Laterality: N/A;  . NISSEN FUNDOPLICATION      Prior to Admission medications   Medication Sig Start Date End Date Taking? Authorizing Provider  acetaminophen (TYLENOL) 325 MG tablet Take 2 tablets (650 mg total) by mouth every 6 (six) hours as needed for mild pain. 06/24/17   Ardelle Balls, PA-C  aspirin EC 81 MG EC tablet Take 1 tablet (81 mg total) by mouth daily. 06/24/17   Ardelle Balls, PA-C  clopidogrel (PLAVIX) 75 MG tablet TAKE 1 TABLET BY MOUTH ONCE DAILY 08/16/17   Donata Clay, Theron Arista, MD  diphenhydrAMINE (BENADRYL) 25 mg capsule Take 25 mg by mouth at bedtime as needed for itching.    [provider]  lisinopril (PRINIVIL,ZESTRIL) 5 MG tablet Take 5 mg by mouth daily.    [provider]  metoprolol tartrate (LOPRESSOR) 25 MG tablet Take 1 tablet (25 mg total) by mouth 2 (two) times daily. 06/24/17   Ardelle BallsZimmerman, Donielle M, PA-C  oxyCODONE (OXY IR/ROXICODONE) 5 MG immediate release tablet Take 5 mg by mouth every 4-6 hours PRN severe pain. 06/24/17   Ardelle BallsZimmerman, Donielle M, PA-C  pantoprazole (PROTONIX) 40 MG tablet Take 40 mg by mouth  daily.    [provider]  rosuvastatin (CRESTOR) 40 MG tablet TAKE 1 TABLET BY MOUTH ONCE DAILY AT  [facility] 08/16/17   Kerin PernaVan Trigt, Peter, MD    Allergies Codeine  Family History  Problem Relation Age of Onset  . Heart attack Brother   . Heart disease Brother     Social History Social History   Tobacco Use  . Smoking status: Former Smoker    Packs/day: 1.50    Years: 43.00    Pack years: 64.50    Types: Cigars, Cigarettes    Last attempt to quit: 07/30/2013    Years since quitting: 4.1  . Smokeless tobacco: Never Used  Substance Use Topics  . Alcohol use: No  . Drug use: No    Review of Systems Constitutional: No fever/chills Eyes: No visual changes. ENT: No sore throat. Cardiovascular: Positive for chest pain. Respiratory: Denies shortness of breath. Gastrointestinal: No abdominal pain.  No nausea, no vomiting.  No diarrhea.  No constipation. Genitourinary: Negative for dysuria. Musculoskeletal: Negative for neck pain.  Negative for back pain. Integumentary: Negative for rash. Neurological: Negative for headaches, focal weakness or numbness.   ____________________________________________   PHYSICAL EXAM:  VITAL SIGNS: ED Triage Vitals  Enc Vitals Group     BP 09/28/17 2027 (!) 201/86     Pulse Rate 09/28/17 2027 (!) 50     Resp 09/28/17 2027 16     Temp 09/28/17 2027 98.9 F (37.2 C)     Temp Source 09/28/17 2027 Oral     SpO2 09/28/17 2027 96 %     Weight 09/28/17 2029 86.6 kg (191 lb)     Height 09/28/17 2029 1.854 m (6\' 1" )     Head Circumference --      Peak Flow --      Pain Score 09/28/17 2033 6     Pain Loc --      Pain Edu? --      Excl. in GC? --     Constitutional: Alert and oriented. Well appearing and in no acute distress. Eyes: Conjunctivae are normal. PERRL. EOMI. Head: Atraumatic. Mouth/Throat: Mucous membranes are moist.  Oropharynx non-erythematous. Neck: No stridor.   Cardiovascular: Normal rate, regular rhythm. Good  peripheral circulation. Grossly normal heart sounds. Respiratory: Normal respiratory effort.  No retractions. Lungs CTAB. Gastrointestinal: Soft and nontender. No distention.  Musculoskeletal: No lower extremity tenderness nor edema. No gross deformities of extremities. Neurologic:  Normal speech and language. No gross focal neurologic deficits are appreciated.  Skin:  Skin is warm, dry and intact. No rash noted. Psychiatric: Mood and affect are normal. Speech and behavior are normal.  ____________________________________________   LABS (all labs ordered are listed, but only abnormal results are displayed)  Labs Reviewed  BASIC METABOLIC PANEL - Abnormal; Notable for the following components:      Result Value   Glucose, Bld 111 (*)    All other components within normal limits  CBC  TROPONIN I  TROPONIN I   ____________________________________________  EKG  ED ECG REPORT I, Elmo N Maryann Mccall, the attending physician, personally viewed and interpreted this  ECG.   Date: 09/28/2017  EKG Time: 8:32 PM  Rate: 46  Rhythm: Sinus bradycardia  Axis: Normal  Intervals: Normal  ST&T Change: None  ED ECG REPORT I, Elliott N Lafern Brinkley, the attending physician, personally viewed and interpreted this ECG.   Date: 09/29/2017  EKG Time: 12:54 AM  Rate: 43  Rhythm: Sinus bradycardia  Axis: Normal  Intervals: Normal  ST&T Change: None ___________________________________________  RADIOLOGY I, [place] N Cynitha Berte, personally viewed and evaluated these images (plain radiographs) as part of my medical decision making, as well as reviewing the written report by the radiologist.  ED MD interpretation: No acute cardiopulmonary findings per radiologist  Official radiology report(s): Dg Chest 2 View  Result Date: 09/28/2017 CLINICAL DATA:  Chest pain and hypertension. EXAM: CHEST - 2 VIEW COMPARISON:  Chest x-ray dated July 30, 2017. FINDINGS: The heart size and mediastinal contours are  within normal limits. Prior CABG. Normal pulmonary vascularity. No focal consolidation, pleural effusion, or pneumothorax. No acute osseous abnormality. IMPRESSION: No active cardiopulmonary disease. Electronically Signed   By: Obie Dredge M.D.   On: 09/28/2017 20:53     Procedures   ____________________________________________   INITIAL IMPRESSION / ASSESSMENT AND PLAN / ED COURSE  As part of my medical decision making, I reviewed the following data within the electronic MEDICAL RECORD NUMBER  C30-year-old male presenting with above-stated history of chest discomfort with markedly elevated blood pressure.  Patient is given IV morphine for pain control and subsequently clonidine 0.1 mg for hypertension with marked improvement of blood pressure with current blood pressure 136/73.  Patient denies any pain at present no shortness of breath.    ____________________________________________  FINAL CLINICAL IMPRESSION(S) / ED DIAGNOSES  Final diagnoses:  Hypertension, unspecified type  Chest pain, unspecified type     MEDICATIONS GIVEN DURING THIS VISIT:  Medications  morphine 2 MG/ML injection 2 mg (has no administration in time range)  ondansetron (ZOFRAN) injection 4 mg (has no administration in time range)     ED Discharge Orders    None       Note:  This document was prepared using Dragon voice recognition software and may include unintentional dictation errors.    Darci Current, MD 09/29/17 [phone]

## 2017-09-30 ENCOUNTER — Encounter (HOSPITAL_COMMUNITY)
Admission: RE | Admit: 2017-09-30 | Discharge: 2017-09-30 | Disposition: A | Discharge status: Home / Self Care | Payer: Medicare HMO | Source: Ambulatory Visit | Attending: Cardiology | Admitting: Cardiology

## 2017-09-30 DIAGNOSIS — Z951 Presence of aortocoronary bypass graft: Secondary | ICD-10-CM | POA: Diagnosis not present

## 2017-09-30 DIAGNOSIS — I213 ST elevation (STEMI) myocardial infarction of unspecified site: Secondary | ICD-10-CM

## 2017-09-30 NOTE — Progress Notes (Signed)
Cardiac Individual Treatment Plan  Patient Details  Name: Curtis Sherman MRN: [id number] Date of Birth: March 01, 1956 Referring Provider:     CARDIAC REHAB PHASE II ORIENTATION from 08/24/2017 in [facility] CARDIAC REHABILITATION  Referring Provider  Dr. Larose Kells      Initial Encounter Date:    CARDIAC REHAB PHASE II ORIENTATION from 08/24/2017 in [place] CARDIAC REHABILITATION  Date  08/24/17  Referring Provider  Dr. Larose Kells      Visit Diagnosis: S/P CABG x 4  ST elevation myocardial infarction (STEMI), unspecified artery (HCC)  Patient's Home Medications on Admission:  Current Outpatient Medications:  .  acetaminophen (TYLENOL) 325 MG tablet, Take 2 tablets (650 mg total) by mouth every 6 (six) hours as needed for mild pain., Disp: , Rfl:  .  aspirin EC 81 MG EC tablet, Take 1 tablet (81 mg total) by mouth daily., Disp: , Rfl:  .  clopidogrel (PLAVIX) 75 MG tablet, TAKE 1 TABLET BY MOUTH ONCE DAILY, Disp: 30 tablet, Rfl: 1 .  diphenhydrAMINE (BENADRYL) 25 mg capsule, Take 25 mg by mouth at bedtime as needed for itching., Disp: , Rfl:  .  lisinopril (PRINIVIL,ZESTRIL) 5 MG tablet, Take 5 mg by mouth daily., Disp: , Rfl:  .  metoprolol tartrate (LOPRESSOR) 25 MG tablet, Take 1 tablet (25 mg total) by mouth 2 (two) times daily., Disp: 30 tablet, Rfl: 1 .  oxyCODONE (OXY IR/ROXICODONE) 5 MG immediate release tablet, Take 5 mg by mouth every 4-6 hours PRN severe pain., Disp: 30 tablet, Rfl: 0 .  pantoprazole (PROTONIX) 40 MG tablet, Take 40 mg by mouth daily., Disp: , Rfl:  .  rosuvastatin (CRESTOR) 40 MG tablet, TAKE 1 TABLET BY MOUTH ONCE DAILY AT  6PM, Disp: 30 tablet, Rfl: 1  Past Medical History: Past Medical History:  Diagnosis Date  . GERD (gastroesophageal reflux disease)   . High cholesterol   . STEMI (ST elevation myocardial infarction) (HCC) 06/17/2017    Tobacco Use: Social History   Tobacco Use  Smoking Status Former Smoker  . Packs/day: 1.50  . Years:  43.00  . Pack years: 64.50  . Types: Cigars, Cigarettes  . Last attempt to quit: 07/30/2013  . Years since quitting: 4.1  Smokeless Tobacco Never Used    Labs: Recent Review Flowsheet Data    Labs for ITP Cardiac and Pulmonary Rehab Latest Ref Rng & Units 06/19/2017 06/19/2017 06/19/2017 06/19/2017 06/19/2017   Cholestrol 0 - 200 mg/dL - - - - -   LDLCALC 0 - 99 mg/dL - - - - -   HDL >40 mg/dL - - - - -   Trlycerides <150 mg/dL - - - - -   Hemoglobin A1c 4.8 - 5.6 % - - - - -   PHART 7.350 - 7.450 7.281(L) 7.263(L) 7.285(L) 7.307(L) -   PCO2ART 32.0 - 48.0 mmHg 46.2 46.5 42.2 44.7 -   HCO3 20.0 - 28.0 mmol/L 22.1 21.1 20.1 22.3 -   TCO2 22 - 32 mmol/L 24 23 21(L) 24 24   ACIDBASEDEF 0.0 - 2.0 mmol/L 5.0(H) 6.0(H) 6.0(H) 4.0(H) -   O2SAT % 97.0 96.0 97.0 97.0 -      Capillary Blood Glucose: Lab Results  Component Value Date   GLUCAP 91 06/23/2017   GLUCAP 124 (H) 06/22/2017   GLUCAP 94 06/22/2017   GLUCAP 128 (H) 06/21/2017   GLUCAP 107 (H) 06/21/2017     Exercise Target Goals:    Exercise Program Goal: Individual exercise prescription set using  results from initial 6 min walk test and THRR while considering  patient's activity barriers and safety.   Exercise Prescription Goal: Starting with aerobic activity 30 plus minutes a day, 3 days per week for initial exercise prescription. Provide home exercise prescription and guidelines that participant acknowledges understanding prior to discharge.  Activity Barriers & Risk Stratification: Activity Barriers & Cardiac Risk Stratification - 08/24/17 1018      Activity Barriers & Cardiac Risk Stratification   Activity Barriers  Back Problems    Cardiac Risk Stratification  High       6 Minute Walk: 6 Minute Walk    Row Name 08/24/17 1017         6 Minute Walk   Phase  Initial     Distance  1400 feet     Distance % Change  0 %     Distance Feet Change  0 ft     Walk Time  6 minutes     # of Rest Breaks  0      MPH  2.65     METS  3     RPE  13     Perceived Dyspnea   12     VO2 Peak  14.03     Symptoms  No     Resting HR  54 bpm     Resting BP  140/80     Resting Oxygen Saturation   95 %     Exercise Oxygen Saturation  during 6 min walk  93 %     Max Ex. HR  98 bpm     Max Ex. BP  170/84     2 Minute Post BP  136/76        Oxygen Initial Assessment:   Oxygen Re-Evaluation:   Oxygen Discharge (Final Oxygen Re-Evaluation):   Initial Exercise Prescription: Initial Exercise Prescription - 08/24/17 1000      Date of Initial Exercise RX and Referring Provider   Date  08/24/17    Referring Provider  Dr. Larose KellsPaeaschos      Treadmill   MPH  2    Grade  0    Minutes  15    METs  2.5      Recumbant Elliptical   Level  1    RPM  50    Watts  55    Minutes  20    METs  3.5      Prescription Details   Frequency (times per week)  3    Duration  Progress to 30 minutes of continuous aerobic without signs/symptoms of physical distress      Intensity   THRR 40-80% of Max Heartrate  [phone]    Ratings of Perceived Exertion  11-13    Perceived Dyspnea  0-4      Progression   Progression  Continue progressive overload as per policy without signs/symptoms or physical distress.      Resistance Training   Training Prescription  Yes    Weight  1    Reps  10-15       Perform Capillary Blood Glucose checks as needed.  Exercise Prescription Changes:  Exercise Prescription Changes    Row Name 09/01/17 0700 09/02/17 1200 09/15/17 1500 09/24/17 1400       Response to Exercise   Blood Pressure (Admit)  150/72  150/72  154/76  120/62    Blood Pressure (Exercise)  170/72  170/72  172/70  162/68    Blood Pressure (  Exit)  142/70  142/70  134/68  142/72    Heart Rate (Admit)  61 bpm  61 bpm  54 bpm  60 bpm    Heart Rate (Exercise)  83 bpm  83 bpm  77 bpm  77 bpm    Heart Rate (Exit)  70 bpm  70 bpm  63 bpm  68 bpm    Rating of Perceived Exertion (Exercise)  10  10  11  11      Duration  Progress to 30 minutes of  aerobic without signs/symptoms of physical distress  Progress to 30 minutes of  aerobic without signs/symptoms of physical distress  Progress to 30 minutes of  aerobic without signs/symptoms of physical distress  Progress to 30 minutes of  aerobic without signs/symptoms of physical distress    Intensity  THRR New [id number]  THRR New 100-119-139  THRR New [phone]  THRR New [phone]      Progression   Progression  Continue to progress workloads to maintain intensity without signs/symptoms of physical distress.  Continue to progress workloads to maintain intensity without signs/symptoms of physical distress.  Continue to progress workloads to maintain intensity without signs/symptoms of physical distress.  Continue to progress workloads to maintain intensity without signs/symptoms of physical distress.      Resistance Training   Training Prescription  Yes  Yes  Yes  Yes    Weight  2  2  3  4     Reps  10-15  10-15  10-15  10-15      Treadmill   MPH  2.4  2.4  2.8  2.9    Grade  0  0  0  0    Minutes  15  15  20  20     METs  2.8  2.8  3.1  3.2      Recumbant Elliptical   Level  2  2  2  3     RPM  70  70  78  68    Watts  96  96  105  99    Minutes  20  20  15  15     METs  5.6  5.6  6  5.3      Home Exercise Plan   Plans to continue exercise at  -  Home (comment)  Home (comment)  Home (comment)    Frequency  -  Add 2 additional days to program exercise sessions.  Add 2 additional days to program exercise sessions.  Add 2 additional days to program exercise sessions.    Initial Home Exercises Provided  -  09/02/17  09/02/17  09/02/17       Exercise Comments:  Exercise Comments    Row Name 09/01/17 0753 09/02/17 1232 09/15/17 1518 09/24/17 1452     Exercise Comments  Patient is doing well in CR and has already progressed on both the treadmill and the recumbent elliptical. Patient has increased his speed to 2.4 and his level to two on the  machines. Patient has still only just started CR and will be progressed more in time.  Patient received the take home exercise plan today 09/02/2017. THR was addressed as were safety guidelines for being active while not in CR. Patient demonstrated an understanding and was encouraged to ask any future questions as they arise.   Patient is doing well in CR and has increased his speed on the treadmill as well as maintaining his level on the elliptical while increasing his watts  and METS  Patient is doing well in CR and has increased his speed on both the treadmill and elliptical. Patients speed is now 2.9 and his level is now 3. Patient has stated that he feels more stamina since starting the program but wishes to be challenged more.        Exercise Goals and Review:  Exercise Goals    Row Name 08/24/17 1019             Exercise Goals   Increase Physical Activity  Yes       Intervention  Provide advice, education, support and counseling about physical activity/exercise needs.;Develop an individualized exercise prescription for aerobic and resistive training based on initial evaluation findings, risk stratification, comorbidities and participant's personal goals.       Expected Outcomes  Short Term: Attend rehab on a regular basis to increase amount of physical activity.       Increase Strength and Stamina  Yes       Intervention  Provide advice, education, support and counseling about physical activity/exercise needs.;Develop an individualized exercise prescription for aerobic and resistive training based on initial evaluation findings, risk stratification, comorbidities and participant's personal goals.       Expected Outcomes  Short Term: Increase workloads from initial exercise prescription for resistance, speed, and METs.       Able to understand and use rate of perceived exertion (RPE) scale  Yes       Intervention  Provide education and explanation on how to use RPE scale       Expected  Outcomes  Short Term: Able to use RPE daily in rehab to express subjective intensity level;Long Term:  Able to use RPE to guide intensity level when exercising independently       Able to understand and use Dyspnea scale  Yes       Intervention  Provide education and explanation on how to use Dyspnea scale       Expected Outcomes  Short Term: Able to use Dyspnea scale daily in rehab to express subjective sense of shortness of breath during exertion;Long Term: Able to use Dyspnea scale to guide intensity level when exercising independently       Knowledge and understanding of Target Heart Rate Range (THRR)  Yes       Intervention  Provide education and explanation of THRR including how the numbers were predicted and where they are located for reference       Expected Outcomes  Short Term: Able to state/look up THRR;Long Term: Able to use THRR to govern intensity when exercising independently;Short Term: Able to use daily as guideline for intensity in rehab       Able to check pulse independently  Yes       Intervention  Provide education and demonstration on how to check pulse in carotid and radial arteries.;Review the importance of being able to check your own pulse for safety during independent exercise       Expected Outcomes  Long Term: Able to check pulse independently and accurately;Short Term: Able to explain why pulse checking is important during independent exercise       Understanding of Exercise Prescription  Yes       Intervention  Provide education, explanation, and written materials on patient's individual exercise prescription       Expected Outcomes  Short Term: Able to explain program exercise prescription;Long Term: Able to explain home exercise prescription to exercise independently  Exercise Goals Re-Evaluation : Exercise Goals Re-Evaluation    Row Name 09/01/17 0752 09/24/17 1450           Exercise Goal Re-Evaluation   Exercise Goals Review  Increase Physical  Activity;Able to understand and use rate of perceived exertion (RPE) scale;Increase Strength and Stamina;Knowledge and understanding of Target Heart Rate Range (THRR);Understanding of Exercise Prescription  Increase Physical Activity;Able to understand and use rate of perceived exertion (RPE) scale;Increase Strength and Stamina;Knowledge and understanding of Target Heart Rate Range (THRR);Understanding of Exercise Prescription      Comments  Patient is doing well in CR and has already progressed on both the treadmill and the recumbent elliptical. Patient has increased his speed to 2.4 and his level to two on the machines. Patient has still only just started CR and will be progressed more in time.   Patient is doing well in CR and has increased his speed on both the treadmill and elliptical. Patients speed is now 2.9 and his level is now 3. Patient has stated that he feels more stamina since starting the program but wishes to be challenged more.       Expected Outcomes  Patient wishes to get back to ADLs and to be able to swing a golf club and play golf again.   Patient wishes to get back to ADLs and to be able to swing a golf club and play golf again.           Discharge Exercise Prescription (Final Exercise Prescription Changes): Exercise Prescription Changes - 09/24/17 1400      Response to Exercise   Blood Pressure (Admit)  120/62    Blood Pressure (Exercise)  162/68    Blood Pressure (Exit)  142/72    Heart Rate (Admit)  60 bpm    Heart Rate (Exercise)  77 bpm    Heart Rate (Exit)  68 bpm    Rating of Perceived Exertion (Exercise)  11    Duration  Progress to 30 minutes of  aerobic without signs/symptoms of physical distress    Intensity  THRR New [phone]      Progression   Progression  Continue to progress workloads to maintain intensity without signs/symptoms of physical distress.      Resistance Training   Training Prescription  Yes    Weight  4    Reps  10-15      Treadmill    MPH  2.9    Grade  0    Minutes  20    METs  3.2      Recumbant Elliptical   Level  3    RPM  68    Watts  99    Minutes  15    METs  5.3      Home Exercise Plan   Plans to continue exercise at  Home (comment)    Frequency  Add 2 additional days to program exercise sessions.    Initial Home Exercises Provided  09/02/17       Nutrition:  Target Goals: Understanding of nutrition guidelines, daily intake of sodium 1500mg , cholesterol 200mg , calories 30% from fat and 7% or less from saturated fats, daily to have 5 or more servings of fruits and vegetables.  Biometrics: Pre Biometrics - 08/24/17 1019      Pre Biometrics   Height  6\' 1"  (1.854 m)    Weight  193 lb (87.5 kg)    Waist Circumference  38 inches    Hip Circumference  39 inches    Waist to Hip Ratio  0.97 %    BMI (Calculated)  25.47    Triceps Skinfold  7 mm    % Body Fat  22.1 %    Grip Strength  80 kg    Flexibility  11.5 in    Single Leg Stand  4 seconds        Nutrition Therapy Plan and Nutrition Goals: Nutrition Therapy & Goals - 09/03/17 1412      Personal Nutrition Goals   Nutrition Goal  For heart healthy choices add >50% of whole grains, make half their plate fruits and vegetables. Discuss the difference between starchy vegetables and leafy greens, and how leafy vegetables provide fiber, helps maintain healthy weight, helps control blood glucose, and lowers cholesterol.  Discuss purchasing fresh or frozen vegetable to reduce sodium and not to add grease, fat or sugar. Consume <18oz of red meat per week. Consume lean cuts of meats and very little of meats high in sodium and nitrates such as pork and lunch meats. Discussed portion control for all food groups.        Intervention Plan   Intervention  Nutrition handout(s) given to patient.    Expected Outcomes  Short Term Goal: Understand basic principles of dietary content, such as calories, fat, sodium, cholesterol and nutrients.       Nutrition  Assessments:   Nutrition Goals Re-Evaluation: Nutrition Goals Re-Evaluation    Row Name 09/30/17 1446             Goals   Current Weight  197 lb (89.4 kg)       Nutrition Goal  For heart healthy choices add >50% of whole grains, make half their plate fruits and vegetables. Discuss the difference between starchy vegetables and leafy greens, and how leafy vegetables provide fiber, helps maintain healthy weight, helps control blood glucose, and lowers cholesterol.  Discuss purchasing fresh or frozen vegetable to reduce sodium and not to add grease, fat or sugar. Consume <18oz of red meat per week. Consume lean cuts of meats and very little of meats high in sodium and nitrates such as pork and lunch meats. Discussed portion control for all food groups.         Comment  Patient has gained 3 lbs since his last 30 day review. He says he continues to eat heart healthy, low sodium, low fat.        Expected Outcome  Patient will continue to meet his nutritional goals.          Personal Goal #2 Re-Evaluation   Personal Goal #2  Patient is eating a very heart healthy diet. Low sodium, low fat, plenty for vegetables an healthy meats and fish.          Nutrition Goals Discharge (Final Nutrition Goals Re-Evaluation): Nutrition Goals Re-Evaluation - 09/30/17 1446      Goals   Current Weight  197 lb (89.4 kg)    Nutrition Goal  For heart healthy choices add >50% of whole grains, make half their plate fruits and vegetables. Discuss the difference between starchy vegetables and leafy greens, and how leafy vegetables provide fiber, helps maintain healthy weight, helps control blood glucose, and lowers cholesterol.  Discuss purchasing fresh or frozen vegetable to reduce sodium and not to add grease, fat or sugar. Consume <18oz of red meat per week. Consume lean cuts of meats and very little of meats high in sodium and nitrates such as pork and lunch meats.  Discussed portion control for all food groups.       Comment  Patient has gained 3 lbs since his last 30 day review. He says he continues to eat heart healthy, low sodium, low fat.     Expected Outcome  Patient will continue to meet his nutritional goals.       Personal Goal #2 Re-Evaluation   Personal Goal #2  Patient is eating a very heart healthy diet. Low sodium, low fat, plenty for vegetables an healthy meats and fish.       Psychosocial: Target Goals: Acknowledge presence or absence of significant depression and/or stress, maximize coping skills, provide positive support system. Participant is able to verbalize types and ability to use techniques and skills needed for reducing stress and depression.  Initial Review & Psychosocial Screening: Initial Psych Review & Screening - 08/24/17 1135      Initial Review   Current issues with  None Identified      Family Dynamics   Good Support System?  Yes      Barriers   Psychosocial barriers to participate in program  There are no identifiable barriers or psychosocial needs.      Screening Interventions   Interventions  Encouraged to exercise    Expected Outcomes  Short Term goal: Identification and review with participant of any Quality of Life or Depression concerns found by scoring the questionnaire.;Long Term goal: The participant improves quality of Life and PHQ9 Scores as seen by post scores and/or verbalization of changes       Quality of Life Scores: Quality of Life - 08/24/17 1020      Quality of Life Scores   Health/Function Pre  26.72 %    Socioeconomic Pre  28.13 %    Psych/Spiritual Pre  24 %    Family Pre  30 %    GLOBAL Pre  27.74 %      Scores of 19 and below usually indicate a poorer quality of life in these areas.  A difference of  2-3 points is a clinically meaningful difference.  A difference of 2-3 points in the total score of the Quality of Life Index has been associated with significant improvement in overall quality of life, self-image, physical symptoms, and  general health in studies assessing change in quality of life.  PHQ-9: Recent Review Flowsheet Data    Depression screen [facility] 2/9 08/24/2017   Decreased Interest 0   Down, Depressed, Hopeless 0   PHQ - 2 Score 0   Altered sleeping 0   Tired, decreased energy 0   Change in appetite 0   Feeling bad or failure about yourself  0   Trouble concentrating 0   Moving slowly or fidgety/restless 0   Suicidal thoughts 0   PHQ-9 Score 0     Interpretation of Total Score  Total Score Depression Severity:  1-4 = Minimal depression, 5-9 = Mild depression, 10-14 = Moderate depression, 15-19 = Moderately severe depression, 20-27 = Severe depression   Psychosocial Evaluation and Intervention: Psychosocial Evaluation - 08/24/17 1136      Psychosocial Evaluation & Interventions   Interventions  Encouraged to exercise with the program and follow exercise prescription    Continue Psychosocial Services   No Follow up required       Psychosocial Re-Evaluation: Psychosocial Re-Evaluation    Row Name 09/30/17 1454             Psychosocial Re-Evaluation   Current issues with  None Identified  Comments  Patient's initial QOL score was 27.74 and his PHQ-9 score was 0 with no psychosocial issues identified.        Expected Outcomes  Patient will have no psychosocial issues identified at discharge.        Interventions  Stress management education;Encouraged to attend Cardiac Rehabilitation for the exercise;Relaxation education       Continue Psychosocial Services   No Follow up required          Psychosocial Discharge (Final Psychosocial Re-Evaluation): Psychosocial Re-Evaluation - 09/30/17 1454      Psychosocial Re-Evaluation   Current issues with  None Identified    Comments  Patient's initial QOL score was 27.74 and his PHQ-9 score was 0 with no psychosocial issues identified.     Expected Outcomes  Patient will have no psychosocial issues identified at discharge.     Interventions   Stress management education;Encouraged to attend Cardiac Rehabilitation for the exercise;Relaxation education    Continue Psychosocial Services   No Follow up required       Vocational Rehabilitation: Provide vocational rehab assistance to qualifying candidates.   Vocational Rehab Evaluation & Intervention: Vocational Rehab - 08/24/17 1103      Initial Vocational Rehab Evaluation & Intervention   Assessment shows need for Vocational Rehabilitation  No       Education: Education Goals: Education classes will be provided on a weekly basis, covering required topics. Participant will state understanding/return demonstration of topics presented.  Learning Barriers/Preferences: Learning Barriers/Preferences - 08/24/17 1103      Learning Barriers/Preferences   Learning Barriers  None    Learning Preferences  Verbal Instruction;Skilled Demonstration;Pictoral;Video       Education Topics: Hypertension, Hypertension Reduction -Define heart disease and high blood pressure. Discus how high blood pressure affects the body and ways to reduce high blood pressure.   Exercise and Your Heart -Discuss why it is important to exercise, the FITT principles of exercise, normal and abnormal responses to exercise, and how to exercise safely.   Angina -Discuss definition of angina, causes of angina, treatment of angina, and how to decrease risk of having angina.   Cardiac Medications -Review what the following cardiac medications are used for, how they affect the body, and side effects that may occur when taking the medications.  Medications include Aspirin, Beta blockers, calcium channel blockers, ACE Inhibitors, angiotensin receptor blockers, diuretics, digoxin, and antihyperlipidemics.   CARDIAC REHAB PHASE II EXERCISE from 09/30/2017 in [place] [facility]  Date  08/26/17  Educator  DC  Instruction Review Code  2- Demonstrated Understanding      Congestive Heart  Failure -Discuss the definition of CHF, how to live with CHF, the signs and symptoms of CHF, and how keep track of weight and sodium intake.   CARDIAC REHAB PHASE II EXERCISE from 09/30/2017 in [place] [facility]  Date  09/02/17  Educator  DC  Instruction Review Code  2- Demonstrated Understanding      Heart Disease and Intimacy -Discus the effect sexual activity has on the heart, how changes occur during intimacy as we age, and safety during sexual activity.   CARDIAC REHAB PHASE II EXERCISE from 09/30/2017 in [place] CARDIAC REHABILITATION  Date  09/09/17  Educator  DJ  Instruction Review Code  2- Demonstrated Understanding      Smoking Cessation / COPD -Discuss different methods to quit smoking, the health benefits of quitting smoking, and the definition of COPD.   CARDIAC REHAB PHASE II EXERCISE from  09/30/2017 in [place] [facility]  Date  09/17/17  Educator  DC  Instruction Review Code  2- Demonstrated Understanding      Nutrition I: Fats -Discuss the types of cholesterol, what cholesterol does to the heart, and how cholesterol levels can be controlled.   CARDIAC REHAB PHASE II EXERCISE from 09/30/2017 in [facility]  Date  09/23/17  Educator  DC  Instruction Review Code  2- Demonstrated Understanding      Nutrition II: Labels -Discuss the different components of food labels and how to read food label   CARDIAC REHAB PHASE II EXERCISE from 09/30/2017 in [facility]  Date  09/30/17  Educator  DJ  Instruction Review Code  2- Demonstrated Understanding      Heart Parts/Heart Disease and PAD -Discuss the anatomy of the heart, the pathway of blood circulation through the heart, and these are affected by heart disease.   Stress I: Signs and Symptoms -Discuss the causes of stress, how stress may lead to anxiety and depression, and ways to limit stress.   Stress II: Relaxation -Discuss different  types of relaxation techniques to limit stress.   Warning Signs of Stroke / TIA -Discuss definition of a stroke, what the signs and symptoms are of a stroke, and how to identify when someone is having stroke.   Knowledge Questionnaire Score: Knowledge Questionnaire Score - 08/24/17 1103      Knowledge Questionnaire Score   Pre Score  22/24       Core Components/Risk Factors/Patient Goals at Admission: Personal Goals and Risk Factors at Admission - 08/24/17 1133      Core Components/Risk Factors/Patient Goals on Admission    Weight Management  Weight Maintenance    Tobacco Cessation  -- Has already quit smoking cigarettes and cigars.     Personal Goal Other  Yes    Personal Goal  Get back to ADL's. amd playing golf.     Intervention  Attend CR 3 x week and supplement with home exercise 2 days week.     Expected Outcomes  Achieve personal goals.        Core Components/Risk Factors/Patient Goals Review:  Goals and Risk Factor Review    Row Name 09/30/17 1452             Core Components/Risk Factors/Patient Goals Review   Personal Goals Review  Weight Management/Obesity Get back to ADL's; play golf again.       Review  Patient has completed 16 sessions. He is doing well in the program with progression. He says he feels stronger and feels like he can play golf but his cardiologist has not released him to play yet. He has more energy and feels the program has helped him. Will continue to monitor.        Expected Outcomes  Patient will complete the program meeting his personal goals.           Core Components/Risk Factors/Patient Goals at Discharge (Final Review):  Goals and Risk Factor Review - 09/30/17 1452      Core Components/Risk Factors/Patient Goals Review   Personal Goals Review  Weight Management/Obesity Get back to ADL's; play golf again.    Review  Patient has completed 16 sessions. He is doing well in the program with progression. He says he feels stronger and  feels like he can play golf but his cardiologist has not released him to play yet. He has more energy and feels the program has  helped him. Will continue to monitor.     Expected Outcomes  Patient will complete the program meeting his personal goals.        ITP Comments: ITP Comments    Row Name 08/24/17 1130 09/02/17 0756         ITP Comments  Mr. Gigante is a pleasant 62 year old man. He is already walking daily at the [facility] sometimes twice a day. Patient is eager to get started.   Patient new to program. He has completed 4 sessions. Will continue to monitor for progress.          Comments: ITP 30 Day REVIEW Patient doing well in the program. Will continue to monitor for progress.

## 2017-09-30 NOTE — Progress Notes (Signed)
Daily Session Note  Patient Details  Name: Curtis Sherman MRN: [id number] Date of Birth: [birth year] Referring Provider:     CARDIAC REHAB PHASE II ORIENTATION from 08/24/2017 in [place]  Referring Provider  Dr. [name]      Encounter Date: 09/30/2017  Check In: Session Check In - 09/30/17 1004      Check-In   Location  [facility]    Staff Present  Diane Angelina Pih, MS, EP, [place], Exercise Physiologist;Debra Wynetta Emery, RN, BSN;Jaelan Rasheed, BS, EP, Exercise Physiologist    Supervising physician immediately available to respond to emergencies  See telemetry face sheet for immediately available MD    Medication changes reported      Yes    Comments  Lisinopril '20mg'$  Changed 09/29/2017 after monday night spent in [facility].     Fall or balance concerns reported     No    Tobacco Cessation  No Change    Warm-up and Cool-down  Performed as group-led instruction    Resistance Training Performed  Yes    VAD Patient?  No      Pain Assessment   Currently in Pain?  No/denies    Pain Score  0-No pain    Multiple Pain Sites  No       Capillary Blood Glucose: No results found for this or any previous visit (from the past 24 hour(s)).    Social History   Tobacco Use  Smoking Status Former Smoker  . Packs/day: 1.50  . Years: 43.00  . Pack years: 64.50  . Types: Cigars, Cigarettes  . Last attempt to quit: 07/30/2013  . Years since quitting: 4.1  Smokeless Tobacco Never Used    Goals Met:  Independence with exercise equipment Exercise tolerated well No report of cardiac concerns or symptoms Strength training completed today  Goals Unmet:  Not Applicable  Comments: Check out 1030   Dr. Kate Sable is Medical Director for [facility] and Pul[facility] Re[facility].

## 2017-10-02 ENCOUNTER — Encounter (HOSPITAL_COMMUNITY)
Admission: RE | Admit: 2017-10-02 | Discharge: 2017-10-02 | Disposition: A | Discharge status: Home / Self Care | Payer: Medicare HMO | Source: Ambulatory Visit | Attending: Cardiology | Admitting: Cardiology

## 2017-10-02 VITALS — Ht 73.0 in | Wt 197.3 lb

## 2017-10-02 DIAGNOSIS — Z951 Presence of aortocoronary bypass graft: Secondary | ICD-10-CM

## 2017-10-02 DIAGNOSIS — I213 ST elevation (STEMI) myocardial infarction of unspecified site: Secondary | ICD-10-CM

## 2017-10-02 NOTE — Progress Notes (Signed)
Daily Session Note  Patient Details  Name: Curtis Sherman MRN: [id number] Date of Birth: [birth year] Referring Provider:     CARDIAC REHAB PHASE II ORIENTATION from 08/24/2017 in [place]  Referring Provider  Dr. [name]      Encounter Date: 10/02/2017  Check In: Session Check In - 10/02/17 0930      Check-In   Location  [facility]    Staff Present  Russella Dar, MS, EP, [facility], Exercise Physiologist;Debra Wynetta Emery, RN, BSN;Gregory Cowan, BS, EP, Exercise Physiologist    Supervising physician immediately available to respond to emergencies  See telemetry face sheet for immediately available MD    Medication changes reported      Yes    Fall or balance concerns reported     No    Tobacco Cessation  No Change    Warm-up and Cool-down  Performed as group-led instruction    Resistance Training Performed  Yes    VAD Patient?  No      Pain Assessment   Currently in Pain?  No/denies    Pain Score  0-No pain    Multiple Pain Sites  No       Capillary Blood Glucose: No results found for this or any previous visit (from the past 24 hour(s)).    Social History   Tobacco Use  Smoking Status Former Smoker  . Packs/day: 1.50  . Years: 43.00  . Pack years: 64.50  . Types: Cigars, Cigarettes  . Last attempt to quit: 07/30/2013  . Years since quitting: 4.1  Smokeless Tobacco Never Used    Goals Met:  Independence with exercise equipment Exercise tolerated well No report of cardiac concerns or symptoms Strength training completed today  Goals Unmet:  Not Applicable  Comments: Check out: 1030   Dr. Kate Sable is Medical Director for [place] and Pulmonary Re[facility].

## 2017-10-05 ENCOUNTER — Encounter (HOSPITAL_COMMUNITY)
Admission: RE | Admit: 2017-10-05 | Discharge: 2017-10-05 | Disposition: A | Discharge status: Home / Self Care | Payer: Medicare HMO | Source: Ambulatory Visit | Attending: Cardiology | Admitting: Cardiology

## 2017-10-05 ENCOUNTER — Encounter (HOSPITAL_COMMUNITY): Payer: Medicare HMO

## 2017-10-07 ENCOUNTER — Encounter (HOSPITAL_COMMUNITY): Payer: Medicare HMO

## 2017-10-09 ENCOUNTER — Encounter (HOSPITAL_COMMUNITY): Payer: Medicare HMO

## 2017-10-12 ENCOUNTER — Encounter (HOSPITAL_COMMUNITY): Payer: Medicare HMO

## 2017-10-14 ENCOUNTER — Encounter (HOSPITAL_COMMUNITY): Payer: Medicare HMO

## 2017-10-15 NOTE — Progress Notes (Signed)
Discharge Progress Report  Patient Details  Name: Curtis Sherman MRN: [id number] Date of Birth: [birth year] Referring Provider:     CARDIAC REHAB PHASE II ORIENTATION from 08/24/2017 in [place]  Referring Provider  Dr. [name]       Number of Visits: 17  Reason for Discharge:  Patient reached a stable level of exercise. Patient independent in their exercise. Patient has met program and personal goals.  Smoking History:  Social History   Tobacco Use  Smoking Status Former Smoker  . Packs/day: 1.50  . Years: 43.00  . Pack years: 64.50  . Types: Cigars, Cigarettes  . Last attempt to quit: 07/30/2013  . Years since quitting: 4.2  Smokeless Tobacco Never Used    Diagnosis:  S/P CABG x 4  ST elevation myocardial infarction (STEMI), unspecified artery (HCC)  ADL UCSD:   Initial Exercise Prescription: Initial Exercise Prescription - 08/24/17 1000      Date of Initial Exercise RX and Referring Provider   Date  08/24/17    Referring Provider  Dr. [name]      Treadmill   MPH  2    Grade  0    Minutes  15    METs  2.5      Recumbant Elliptical   Level  1    RPM  50    Watts  55    Minutes  20    METs  3.5      Prescription Details   Frequency (times per week)  3    Duration  Progress to 30 minutes of continuous aerobic without signs/symptoms of physical distress      Intensity   THRR 40-80% of Max Heartrate  [phone]    Ratings of Perceived Exertion  11-13    Perceived Dyspnea  0-4      Progression   Progression  Continue progressive overload as per policy without signs/symptoms or physical distress.      Resistance Training   Training Prescription  Yes    Weight  1    Reps  10-15       Discharge Exercise Prescription (Final Exercise Prescription Changes): Exercise Prescription Changes - 09/24/17 1400      Response to Exercise   Blood Pressure (Admit)  120/62    Blood Pressure (Exercise)  162/68    Blood  Pressure (Exit)  142/72    Heart Rate (Admit)  60 bpm    Heart Rate (Exercise)  77 bpm    Heart Rate (Exit)  68 bpm    Rating of Perceived Exertion (Exercise)  11    Duration  Progress to 30 minutes of  aerobic without signs/symptoms of physical distress    Intensity  THRR New [phone]      Progression   Progression  Continue to progress workloads to maintain intensity without signs/symptoms of physical distress.      Resistance Training   Training Prescription  Yes    Weight  4    Reps  10-15      Treadmill   MPH  2.9    Grade  0    Minutes  20    METs  3.2      Recumbant Elliptical   Level  3    RPM  68    Watts  99    Minutes  15    METs  5.3      Home Exercise Plan   Plans to continue exercise at  Home (  comment)    Frequency  Add 2 additional days to program exercise sessions.    Initial Home Exercises Provided  09/02/17       Functional Capacity: 6 Minute Walk    Row Name 08/24/17 1017 10/15/17 0818       6 Minute Walk   Phase  Initial  Discharge    Distance  1400 feet  1650 feet    Distance % Change  0 %  17.8 %    Distance Feet Change  0 ft  250 ft    Walk Time  6 minutes  6 minutes    # of Rest Breaks  0  0    MPH  2.65  3.12    METS  3  3.37    RPE  13  11    Perceived Dyspnea   12  11    VO2 Peak  14.03  15.68    Symptoms  No  No    Resting HR  54 bpm  54 bpm    Resting BP  140/80  160/66    Resting Oxygen Saturation   95 %  96 %    Exercise Oxygen Saturation  during 6 min walk  93 %  92 %    Max Ex. HR  98 bpm  104 bpm    Max Ex. BP  170/84  170/76    2 Minute Post BP  136/76  148/74       Psychological, QOL, Others - Outcomes: PHQ 2/9: Depression screen PHQ 2/9 08/24/2017  Decreased Interest 0  Down, Depressed, Hopeless 0  PHQ - 2 Score 0  Altered sleeping 0  Tired, decreased energy 0  Change in appetite 0  Feeling bad or failure about yourself  0  Trouble concentrating 0  Moving slowly or fidgety/restless 0  Suicidal thoughts 0   PHQ-9 Score 0    Quality of Life: Quality of Life - 08/24/17 1020      Quality of Life Scores   Health/Function Pre  26.72 %    Socioeconomic Pre  28.13 %    Psych/Spiritual Pre  24 %    Family Pre  30 %    GLOBAL Pre  27.74 %       Personal Goals: Goals established at orientation with interventions provided to work toward goal. Personal Goals and Risk Factors at Admission - 08/24/17 1133      Core Components/Risk Factors/Patient Goals on Admission    Weight Management  Weight Maintenance    Tobacco Cessation  -- Has already quit smoking cigarettes and cigars.     Personal Goal Other  Yes    Personal Goal  Get back to ADL's. amd playing golf.     Intervention  Attend CR 3 x week and supplement with home exercise 2 days week.     Expected Outcomes  Achieve personal goals.         Personal Goals Discharge: Goals and Risk Factor Review    Row Name 09/30/17 1452             Core Components/Risk Factors/Patient Goals Review   Personal Goals Review  Weight Management/Obesity Get back to ADL's; play golf again.       Review  Patient has completed 16 sessions. He is doing well in the program with progression. He says he feels stronger and feels like he can play golf but his cardiologist has not released him to play yet. He has more energy  and feels the program has helped him. Will continue to monitor.        Expected Outcomes  Patient will complete the program meeting his personal goals.           Exercise Goals and Review: Exercise Goals    Row Name 08/24/17 1019             Exercise Goals   Increase Physical Activity  Yes       Intervention  Provide advice, education, support and counseling about physical activity/exercise needs.;Develop an individualized exercise prescription for aerobic and resistive training based on initial evaluation findings, risk stratification, comorbidities and participant's personal goals.       Expected Outcomes  Short Term: Attend rehab  on a regular basis to increase amount of physical activity.       Increase Strength and Stamina  Yes       Intervention  Provide advice, education, support and counseling about physical activity/exercise needs.;Develop an individualized exercise prescription for aerobic and resistive training based on initial evaluation findings, risk stratification, comorbidities and participant's personal goals.       Expected Outcomes  Short Term: Increase workloads from initial exercise prescription for resistance, speed, and METs.       Able to understand and use rate of perceived exertion (RPE) scale  Yes       Intervention  Provide education and explanation on how to use RPE scale       Expected Outcomes  Short Term: Able to use RPE daily in rehab to express subjective intensity level;Long Term:  Able to use RPE to guide intensity level when exercising independently       Able to understand and use Dyspnea scale  Yes       Intervention  Provide education and explanation on how to use Dyspnea scale       Expected Outcomes  Short Term: Able to use Dyspnea scale daily in rehab to express subjective sense of shortness of breath during exertion;Long Term: Able to use Dyspnea scale to guide intensity level when exercising independently       Knowledge and understanding of Target Heart Rate Range (THRR)  Yes       Intervention  Provide education and explanation of THRR including how the numbers were predicted and where they are located for reference       Expected Outcomes  Short Term: Able to state/look up THRR;Long Term: Able to use THRR to govern intensity when exercising independently;Short Term: Able to use daily as guideline for intensity in rehab       Able to check pulse independently  Yes       Intervention  Provide education and demonstration on how to check pulse in carotid and radial arteries.;Review the importance of being able to check your own pulse for safety during independent exercise       Expected  Outcomes  Long Term: Able to check pulse independently and accurately;Short Term: Able to explain why pulse checking is important during independent exercise       Understanding of Exercise Prescription  Yes       Intervention  Provide education, explanation, and written materials on patient's individual exercise prescription       Expected Outcomes  Short Term: Able to explain program exercise prescription;Long Term: Able to explain home exercise prescription to exercise independently          Nutrition & Weight - Outcomes: Pre Biometrics - 08/24/17 1019  Pre Biometrics   Height  _0  (1.854 m)    Weight  193 lb (87.5 kg)    Waist Circumference  38 inches    Hip Circumference  39 inches    Waist to Hip Ratio  0.97 %    BMI (Calculated)  25.47    Triceps Skinfold  7 mm    % Body Fat  22.1 %    Grip Strength  80 kg    Flexibility  11.5 in    Single Leg Stand  4 seconds      Post Biometrics - 10/15/17 0820       Post  Biometrics   Height  _1  (1.854 m)    Weight  197 lb 5 oz (89.5 kg)    Waist Circumference  39 inches    Hip Circumference  39 inches    Waist to Hip Ratio  1 %    BMI (Calculated)  26.04    Triceps Skinfold  7 mm    % Body Fat  22.8 %    Grip Strength  61.2 kg    Flexibility  12.66 in    Single Leg Stand  35 seconds       Nutrition: Nutrition Therapy & Goals - 09/03/17 1412      Personal Nutrition Goals   Nutrition Goal  For heart healthy choices add >50% of whole grains, make half their plate fruits and vegetables. Discuss the difference between starchy vegetables and leafy greens, and how leafy vegetables provide fiber, helps maintain healthy weight, helps control blood glucose, and lowers cholesterol.  Discuss purchasing fresh or frozen vegetable to reduce sodium and not to add grease, fat or sugar. Consume <18oz of red meat per week. Consume lean cuts of meats and very little of meats high in sodium and nitrates such as pork and lunch meats.  Discussed portion control for all food groups.        Intervention Plan   Intervention  Nutrition handout(s) given to patient.    Expected Outcomes  Short Term Goal: Understand basic principles of dietary content, such as calories, fat, sodium, cholesterol and nutrients.       Nutrition Discharge:   Education Questionnaire Score: Knowledge Questionnaire Score - 08/24/17 1103      Knowledge Questionnaire Score   Pre Score  22/24       Goals reviewed with patient; copy given to patient.

## 2017-10-15 NOTE — Progress Notes (Signed)
Cardiac Individual Treatment Plan  Patient Details  Name: Curtis Sherman MRN: [id number] Date of Birth: [birth year] Referring Provider:     CARDIAC REHAB [facility] from 08/24/2017 in [place]  Referring Provider  Dr. [name]      Initial Encounter Date:    CARDIAC REHAB PHASE II ORIENTATION from 08/24/2017 in [place]  Date  08/24/17  Referring Provider  Dr. [name]      Visit Diagnosis: S/P CABG x 4  ST elevation myocardial infarction (STEMI), unspecified artery ([place])  Patient's Home Medications on Admission:  Current Outpatient Medications:  .  acetaminophen (TYLENOL) 325 MG tablet, Take 2 tablets (650 mg total) by mouth every 6 (six) hours as needed for mild pain., Disp: , Rfl:  .  aspirin EC 81 MG EC tablet, Take 1 tablet (81 mg total) by mouth daily., Disp: , Rfl:  .  clopidogrel (PLAVIX) 75 MG tablet, TAKE 1 TABLET BY MOUTH ONCE DAILY, Disp: 30 tablet, Rfl: 1 .  diphenhydrAMINE (BENADRYL) 25 mg capsule, Take 25 mg by mouth at bedtime as needed for itching., Disp: , Rfl:  .  lisinopril (PRINIVIL,ZESTRIL) 5 MG tablet, Take 5 mg by mouth daily., Disp: , Rfl:  .  metoprolol tartrate (LOPRESSOR) 25 MG tablet, Take 1 tablet (25 mg total) by mouth 2 (two) times daily., Disp: 30 tablet, Rfl: 1 .  oxyCODONE (OXY IR/ROXICODONE) 5 MG immediate release tablet, Take 5 mg by mouth every 4-6 hours PRN severe pain., Disp: 30 tablet, Rfl: 0 .  pantoprazole (PROTONIX) 40 MG tablet, Take 40 mg by mouth daily., Disp: , Rfl:  .  rosuvastatin (CRESTOR) 40 MG tablet, TAKE 1 TABLET BY MOUTH ONCE DAILY AT  6PM, Disp: 30 tablet, Rfl: 1  Past Medical History: Past Medical History:  Diagnosis Date  . GERD (gastroesophageal reflux disease)   . High cholesterol   . STEMI (ST elevation myocardial infarction) ([place]) 06/17/2017    Tobacco Use: Social History   Tobacco Use  Smoking Status Former Smoker  . Packs/day: 1.50  . Years:  43.00  . Pack years: 64.50  . Types: Cigars, Cigarettes  . Last attempt to quit: 07/30/2013  . Years since quitting: 4.2  Smokeless Tobacco Never Used    Labs: Recent Review Flowsheet Data    Labs for ITP Cardiac and Pulmonary Rehab Latest Ref Rng & Units 06/19/2017 06/19/2017 06/19/2017 06/19/2017 06/19/2017   Cholestrol 0 - 200 mg/dL - - - - -   LDLCALC 0 - 99 mg/dL - - - - -   HDL >40 mg/dL - - - - -   Trlycerides <150 mg/dL - - - - -   Hemoglobin A1c 4.8 - 5.6 % - - - - -   PHART 7.350 - 7.450 7.281(L) 7.263(L) 7.285(L) 7.307(L) -   PCO2ART 32.0 - 48.0 mmHg 46.2 46.5 42.2 44.7 -   HCO3 20.0 - 28.0 mmol/L 22.1 21.1 20.1 22.3 -   TCO2 22 - 32 mmol/L 24 23 21(L) 24 24   ACIDBASEDEF 0.0 - 2.0 mmol/L 5.0(H) 6.0(H) 6.0(H) 4.0(H) -   O2SAT % 97.0 96.0 97.0 97.0 -      Capillary Blood Glucose: Lab Results  Component Value Date   GLUCAP 91 06/23/2017   GLUCAP 124 (H) 06/22/2017   GLUCAP 94 06/22/2017   GLUCAP 128 (H) 06/21/2017   GLUCAP 107 (H) 06/21/2017     Exercise Target Goals:    Exercise Program Goal: Individual exercise prescription set using  results from initial 6 min walk test and THRR while considering  patient's activity barriers and safety.   Exercise Prescription Goal: Starting with aerobic activity 30 plus minutes a day, 3 days per week for initial exercise prescription. Provide home exercise prescription and guidelines that participant acknowledges understanding prior to discharge.  Activity Barriers & Risk Stratification: Activity Barriers & Cardiac Risk Stratification - 08/24/17 1018      Activity Barriers & Cardiac Risk Stratification   Activity Barriers  Back Problems    Cardiac Risk Stratification  High       6 Minute Walk: 6 Minute Walk    Row Name 08/24/17 1017 10/15/17 0818       6 Minute Walk   Phase  Initial  Discharge    Distance  1400 feet  1650 feet    Distance % Change  0 %  17.8 %    Distance Feet Change  0 ft  250 ft    Walk  Time  6 minutes  6 minutes    # of Rest Breaks  0  0    MPH  2.65  3.12    METS  3  3.37    RPE  13  11    Perceived Dyspnea   12  11    VO2 Peak  14.03  15.68    Symptoms  No  No    Resting HR  54 bpm  54 bpm    Resting BP  140/80  160/66    Resting Oxygen Saturation   95 %  96 %    Exercise Oxygen Saturation  during 6 min walk  93 %  92 %    Max Ex. HR  98 bpm  104 bpm    Max Ex. BP  170/84  170/76    2 Minute Post BP  136/76  148/74       Oxygen Initial Assessment:   Oxygen Re-Evaluation:   Oxygen Discharge (Final Oxygen Re-Evaluation):   Initial Exercise Prescription: Initial Exercise Prescription - 08/24/17 1000      Date of Initial Exercise RX and Referring Provider   Date  08/24/17    Referring Provider  Dr. [name]      Treadmill   MPH  2    Grade  0    Minutes  15    METs  2.5      Recumbant Elliptical   Level  1    RPM  50    Watts  55    Minutes  20    METs  3.5      Prescription Details   Frequency (times per week)  3    Duration  Progress to 30 minutes of continuous aerobic without signs/symptoms of physical distress      Intensity   THRR 40-80% of Max Heartrate  [phone]    Ratings of Perceived Exertion  11-13    Perceived Dyspnea  0-4      Progression   Progression  Continue progressive overload as per policy without signs/symptoms or physical distress.      Resistance Training   Training Prescription  Yes    Weight  1    Reps  10-15       Perform Capillary Blood Glucose checks as needed.  Exercise Prescription Changes:  Exercise Prescription Changes    Row Name 09/01/17 0700 09/02/17 1200 09/15/17 1500 09/24/17 1400       Response to Exercise   Blood Pressure (Admit)  150/72  150/72  154/76  120/62    Blood Pressure (Exercise)  170/72  170/72  172/70  162/68    Blood Pressure (Exit)  142/70  142/70  134/68  142/72    Heart Rate (Admit)  61 bpm  61 bpm  54 bpm  60 bpm    Heart Rate (Exercise)  83 bpm  83 bpm  77 bpm  77  bpm    Heart Rate (Exit)  70 bpm  70 bpm  63 bpm  68 bpm    Rating of Perceived Exertion (Exercise)  '10  10  11  11    '$ Duration  Progress to 30 minutes of  aerobic without signs/symptoms of physical distress  Progress to 30 minutes of  aerobic without signs/symptoms of physical distress  Progress to 30 minutes of  aerobic without signs/symptoms of physical distress  Progress to 30 minutes of  aerobic without signs/symptoms of physical distress    Intensity  THRR New [phone]  THRR New 100-119-139  THRR New [phone]  THRR New [phone]      Progression   Progression  Continue to progress workloads to maintain intensity without signs/symptoms of physical distress.  Continue to progress workloads to maintain intensity without signs/symptoms of physical distress.  Continue to progress workloads to maintain intensity without signs/symptoms of physical distress.  Continue to progress workloads to maintain intensity without signs/symptoms of physical distress.      Resistance Training   Training Prescription  Yes  Yes  Yes  Yes    Weight  '2  2  3  4    '$ Reps  10-15  10-15  10-15  10-15      Treadmill   MPH  2.4  2.4  2.8  2.9    Grade  0  0  0  0    Minutes  '15  15  20  20    '$ METs  2.8  2.8  3.1  3.2      Recumbant Elliptical   Level  '2  2  2  3    '$ RPM  70  70  78  68    Watts  96  96  105  99    Minutes  '20  20  15  15    '$ METs  5.6  5.6  6  5.3      Home Exercise Plan   Plans to continue exercise at  -  Home (comment)  Home (comment)  Home (comment)    Frequency  -  Add 2 additional days to program exercise sessions.  Add 2 additional days to program exercise sessions.  Add 2 additional days to program exercise sessions.    Initial Home Exercises Provided  -  09/02/17  09/02/17  09/02/17       Exercise Comments:  Exercise Comments    Row Name 09/01/17 0753 09/02/17 1232 09/15/17 1518 09/24/17 1452     Exercise Comments  Patient is doing well in CR and has already progressed on  both the treadmill and the recumbent elliptical. Patient has increased his speed to 2.4 and his level to two on the machines. Patient has still only just started CR and will be progressed more in time.  Patient received the take home exercise plan today 09/02/2017. THR was addressed as were safety guidelines for being active while not in CR. Patient demonstrated an understanding and was encouraged to ask any future questions as they arise.   Patient  is doing well in CR and has increased his speed on the treadmill as well as maintaining his level on the elliptical while increasing his watts and METS  Patient is doing well in CR and has increased his speed on both the treadmill and elliptical. Patients speed is now 2.9 and his level is now 3. Patient has stated that he feels more stamina since starting the program but wishes to be challenged more.        Exercise Goals and Review:  Exercise Goals    Row Name 08/24/17 1019             Exercise Goals   Increase Physical Activity  Yes       Intervention  Provide advice, education, support and counseling about physical activity/exercise needs.;Develop an individualized exercise prescription for aerobic and resistive training based on initial evaluation findings, risk stratification, comorbidities and participant's personal goals.       Expected Outcomes  Short Term: Attend rehab on a regular basis to increase amount of physical activity.       Increase Strength and Stamina  Yes       Intervention  Provide advice, education, support and counseling about physical activity/exercise needs.;Develop an individualized exercise prescription for aerobic and resistive training based on initial evaluation findings, risk stratification, comorbidities and participant's personal goals.       Expected Outcomes  Short Term: Increase workloads from initial exercise prescription for resistance, speed, and METs.       Able to understand and use rate of perceived exertion  (RPE) scale  Yes       Intervention  Provide education and explanation on how to use RPE scale       Expected Outcomes  Short Term: Able to use RPE daily in rehab to express subjective intensity level;Long Term:  Able to use RPE to guide intensity level when exercising independently       Able to understand and use Dyspnea scale  Yes       Intervention  Provide education and explanation on how to use Dyspnea scale       Expected Outcomes  Short Term: Able to use Dyspnea scale daily in rehab to express subjective sense of shortness of breath during exertion;Long Term: Able to use Dyspnea scale to guide intensity level when exercising independently       Knowledge and understanding of Target Heart Rate Range (THRR)  Yes       Intervention  Provide education and explanation of THRR including how the numbers were predicted and where they are located for reference       Expected Outcomes  Short Term: Able to state/look up THRR;Long Term: Able to use THRR to govern intensity when exercising independently;Short Term: Able to use daily as guideline for intensity in rehab       Able to check pulse independently  Yes       Intervention  Provide education and demonstration on how to check pulse in carotid and radial arteries.;Review the importance of being able to check your own pulse for safety during independent exercise       Expected Outcomes  Long Term: Able to check pulse independently and accurately;Short Term: Able to explain why pulse checking is important during independent exercise       Understanding of Exercise Prescription  Yes       Intervention  Provide education, explanation, and written materials on patient's individual exercise prescription  Expected Outcomes  Short Term: Able to explain program exercise prescription;Long Term: Able to explain home exercise prescription to exercise independently          Exercise Goals Re-Evaluation : Exercise Goals Re-Evaluation    Row Name 09/01/17  0752 09/24/17 1450           Exercise Goal Re-Evaluation   Exercise Goals Review  Increase Physical Activity;Able to understand and use rate of perceived exertion (RPE) scale;Increase Strength and Stamina;Knowledge and understanding of Target Heart Rate Range (THRR);Understanding of Exercise Prescription  Increase Physical Activity;Able to understand and use rate of perceived exertion (RPE) scale;Increase Strength and Stamina;Knowledge and understanding of Target Heart Rate Range (THRR);Understanding of Exercise Prescription      Comments  Patient is doing well in CR and has already progressed on both the treadmill and the recumbent elliptical. Patient has increased his speed to 2.4 and his level to two on the machines. Patient has still only just started CR and will be progressed more in time.   Patient is doing well in CR and has increased his speed on both the treadmill and elliptical. Patients speed is now 2.9 and his level is now 3. Patient has stated that he feels more stamina since starting the program but wishes to be challenged more.       Expected Outcomes  Patient wishes to get back to ADLs and to be able to swing a golf club and play golf again.   Patient wishes to get back to ADLs and to be able to swing a golf club and play golf again.           Discharge Exercise Prescription (Final Exercise Prescription Changes): Exercise Prescription Changes - 09/24/17 1400      Response to Exercise   Blood Pressure (Admit)  120/62    Blood Pressure (Exercise)  162/68    Blood Pressure (Exit)  142/72    Heart Rate (Admit)  60 bpm    Heart Rate (Exercise)  77 bpm    Heart Rate (Exit)  68 bpm    Rating of Perceived Exertion (Exercise)  11    Duration  Progress to 30 minutes of  aerobic without signs/symptoms of physical distress    Intensity  THRR New [phone]      Progression   Progression  Continue to progress workloads to maintain intensity without signs/symptoms of physical distress.       Resistance Training   Training Prescription  Yes    Weight  4    Reps  10-15      Treadmill   MPH  2.9    Grade  0    Minutes  20    METs  3.2      Recumbant Elliptical   Level  3    RPM  68    Watts  99    Minutes  15    METs  5.3      Home Exercise Plan   Plans to continue exercise at  Home (comment)    Frequency  Add 2 additional days to program exercise sessions.    Initial Home Exercises Provided  09/02/17       Nutrition:  Target Goals: Understanding of nutrition guidelines, daily intake of sodium '1500mg'$ , cholesterol '200mg'$ , calories 30% from fat and 7% or less from saturated fats, daily to have 5 or more servings of fruits and vegetables.  Biometrics: Pre Biometrics - 08/24/17 1019      Pre Biometrics  Height  '6\' 1"'$  (1.854 m)    Weight  193 lb (87.5 kg)    Waist Circumference  38 inches    Hip Circumference  39 inches    Waist to Hip Ratio  0.97 %    BMI (Calculated)  25.47    Triceps Skinfold  7 mm    % Body Fat  22.1 %    Grip Strength  80 kg    Flexibility  11.5 in    Single Leg Stand  4 seconds      Post Biometrics - 10/15/17 0820       Post  Biometrics   Height  '6\' 1"'$  (1.854 m)    Weight  197 lb 5 oz (89.5 kg)    Waist Circumference  39 inches    Hip Circumference  39 inches    Waist to Hip Ratio  1 %    BMI (Calculated)  26.04    Triceps Skinfold  7 mm    % Body Fat  22.8 %    Grip Strength  61.2 kg    Flexibility  12.66 in    Single Leg Stand  35 seconds       Nutrition Therapy Plan and Nutrition Goals: Nutrition Therapy & Goals - 09/03/17 1412      Personal Nutrition Goals   Nutrition Goal  For heart healthy choices add >50% of whole grains, make half their plate fruits and vegetables. Discuss the difference between starchy vegetables and leafy greens, and how leafy vegetables provide fiber, helps maintain healthy weight, helps control blood glucose, and lowers cholesterol.  Discuss purchasing fresh or frozen vegetable to reduce  sodium and not to add grease, fat or sugar. Consume <18oz of red meat per week. Consume lean cuts of meats and very little of meats high in sodium and nitrates such as pork and lunch meats. Discussed portion control for all food groups.        Intervention Plan   Intervention  Nutrition handout(s) given to patient.    Expected Outcomes  Short Term Goal: Understand basic principles of dietary content, such as calories, fat, sodium, cholesterol and nutrients.       Nutrition Assessments:   Nutrition Goals Re-Evaluation: Nutrition Goals Re-Evaluation    Row Name 09/30/17 1446             Goals   Current Weight  197 lb (89.4 kg)       Nutrition Goal  For heart healthy choices add >50% of whole grains, make half their plate fruits and vegetables. Discuss the difference between starchy vegetables and leafy greens, and how leafy vegetables provide fiber, helps maintain healthy weight, helps control blood glucose, and lowers cholesterol.  Discuss purchasing fresh or frozen vegetable to reduce sodium and not to add grease, fat or sugar. Consume <18oz of red meat per week. Consume lean cuts of meats and very little of meats high in sodium and nitrates such as pork and lunch meats. Discussed portion control for all food groups.         Comment  Patient has gained 3 lbs since his last 30 day review. He says he continues to eat heart healthy, low sodium, low fat.        Expected Outcome  Patient will continue to meet his nutritional goals.          Personal Goal #2 Re-Evaluation   Personal Goal #2  Patient is eating a very heart healthy diet. Low sodium, low  fat, plenty for vegetables an healthy meats and fish.          Nutrition Goals Discharge (Final Nutrition Goals Re-Evaluation): Nutrition Goals Re-Evaluation - 09/30/17 1446      Goals   Current Weight  197 lb (89.4 kg)    Nutrition Goal  For heart healthy choices add >50% of whole grains, make half their plate fruits and vegetables.  Discuss the difference between starchy vegetables and leafy greens, and how leafy vegetables provide fiber, helps maintain healthy weight, helps control blood glucose, and lowers cholesterol.  Discuss purchasing fresh or frozen vegetable to reduce sodium and not to add grease, fat or sugar. Consume <18oz of red meat per week. Consume lean cuts of meats and very little of meats high in sodium and nitrates such as pork and lunch meats. Discussed portion control for all food groups.      Comment  Patient has gained 3 lbs since his last 30 day review. He says he continues to eat heart healthy, low sodium, low fat.     Expected Outcome  Patient will continue to meet his nutritional goals.       Personal Goal #2 Re-Evaluation   Personal Goal #2  Patient is eating a very heart healthy diet. Low sodium, low fat, plenty for vegetables an healthy meats and fish.       Psychosocial: Target Goals: Acknowledge presence or absence of significant depression and/or stress, maximize coping skills, provide positive support system. Participant is able to verbalize types and ability to use techniques and skills needed for reducing stress and depression.  Initial Review & Psychosocial Screening: Initial Psych Review & Screening - 08/24/17 1135      Initial Review   Current issues with  None Identified      Family Dynamics   Good Support System?  Yes      Barriers   Psychosocial barriers to participate in program  There are no identifiable barriers or psychosocial needs.      Screening Interventions   Interventions  Encouraged to exercise    Expected Outcomes  Short Term goal: Identification and review with participant of any Quality of Life or Depression concerns found by scoring the questionnaire.;Long Term goal: The participant improves quality of Life and PHQ9 Scores as seen by post scores and/or verbalization of changes       Quality of Life Scores: Quality of Life - 08/24/17 1020      Quality of  Life Scores   Health/Function Pre  26.72 %    Socioeconomic Pre  28.13 %    Psych/Spiritual Pre  24 %    Family Pre  30 %    GLOBAL Pre  27.74 %      Scores of 19 and below usually indicate a poorer quality of life in these areas.  A difference of  2-3 points is a clinically meaningful difference.  A difference of 2-3 points in the total score of the Quality of Life Index has been associated with significant improvement in overall quality of life, self-image, physical symptoms, and general health in studies assessing change in quality of life.  PHQ-9: Recent Review Flowsheet Data    Depression screen [facility] 2/9 08/24/2017   Decreased Interest 0   Down, Depressed, Hopeless 0   PHQ - 2 Score 0   Altered sleeping 0   Tired, decreased energy 0   Change in appetite 0   Feeling bad or failure about yourself  0   Trouble  concentrating 0   Moving slowly or fidgety/restless 0   Suicidal thoughts 0   PHQ-9 Score 0     Interpretation of Total Score  Total Score Depression Severity:  1-4 = Minimal depression, 5-9 = Mild depression, 10-14 = Moderate depression, 15-19 = Moderately severe depression, 20-27 = Severe depression   Psychosocial Evaluation and Intervention: Psychosocial Evaluation - 08/24/17 1136      Psychosocial Evaluation & Interventions   Interventions  Encouraged to exercise with the program and follow exercise prescription    Continue Psychosocial Services   No Follow up required       Psychosocial Re-Evaluation: Psychosocial Re-Evaluation    [place] Name 09/30/17 1454             Psychosocial Re-Evaluation   Current issues with  None Identified       Comments  Patient's initial QOL score was 27.74 and his PHQ-9 score was 0 with no psychosocial issues identified.        Expected Outcomes  Patient will have no psychosocial issues identified at discharge.        Interventions  Stress management education;Encouraged to attend Cardiac Rehabilitation for the  exercise;Relaxation education       Continue Psychosocial Services   No Follow up required          Psychosocial Discharge (Final Psychosocial Re-Evaluation): Psychosocial Re-Evaluation - 09/30/17 1454      Psychosocial Re-Evaluation   Current issues with  None Identified    Comments  Patient's initial QOL score was 27.74 and his PHQ-9 score was 0 with no psychosocial issues identified.     Expected Outcomes  Patient will have no psychosocial issues identified at discharge.     Interventions  Stress management education;Encouraged to attend Cardiac Rehabilitation for the exercise;Relaxation education    Continue Psychosocial Services   No Follow up required       Vocational Rehabilitation: Provide vocational rehab assistance to qualifying candidates.   Vocational Rehab Evaluation & Intervention: Vocational Rehab - 08/24/17 1103      Initial Vocational Rehab Evaluation & Intervention   Assessment shows need for Vocational Rehabilitation  No       Education: Education Goals: Education classes will be provided on a weekly basis, covering required topics. Participant will state understanding/return demonstration of topics presented.  Learning Barriers/Preferences: Learning Barriers/Preferences - 08/24/17 1103      Learning Barriers/Preferences   Learning Barriers  None    Learning Preferences  Verbal Instruction;Skilled Demonstration;Pictoral;Video       Education Topics: Hypertension, Hypertension Reduction -Define heart disease and high blood pressure. Discus how high blood pressure affects the body and ways to reduce high blood pressure.   Exercise and Your Heart -Discuss why it is important to exercise, the FITT principles of exercise, normal and abnormal responses to exercise, and how to exercise safely.   Angina -Discuss definition of angina, causes of angina, treatment of angina, and how to decrease risk of having angina.   Cardiac Medications -Review what the  following cardiac medications are used for, how they affect the body, and side effects that may occur when taking the medications.  Medications include Aspirin, Beta blockers, calcium channel blockers, ACE Inhibitors, angiotensin receptor blockers, diuretics, digoxin, and antihyperlipidemics.   CARDIAC REHAB PHASE II EXERCISE from 09/30/2017 in [place]  Date  08/26/17  Educator  DC  Instruction Review Code  2- Demonstrated Understanding      Congestive Heart Failure -Discuss the definition of CHF,  how to live with CHF, the signs and symptoms of CHF, and how keep track of weight and sodium intake.   CARDIAC REHAB PHASE II EXERCISE from 09/30/2017 in [place]  Date  09/02/17  Educator  DC  Instruction Review Code  2- Demonstrated Understanding      Heart Disease and Intimacy -Discus the effect sexual activity has on the heart, how changes occur during intimacy as we age, and safety during sexual activity.   CARDIAC REHAB PHASE II EXERCISE from 09/30/2017 in [place]  Date  09/09/17  Educator  DJ  Instruction Review Code  2- Demonstrated Understanding      Smoking Cessation / COPD -Discuss different methods to quit smoking, the health benefits of quitting smoking, and the definition of COPD.   CARDIAC REHAB PHASE II EXERCISE from 09/30/2017 in [place]  Date  09/17/17  Educator  DC  Instruction Review Code  2- Demonstrated Understanding      Nutrition I: Fats -Discuss the types of cholesterol, what cholesterol does to the heart, and how cholesterol levels can be controlled.   CARDIAC REHAB PHASE II EXERCISE from 09/30/2017 in [place]  Date  09/23/17  Educator  DC  Instruction Review Code  2- Demonstrated Understanding      Nutrition II: Labels -Discuss the different components of food labels and how to read food label   CARDIAC REHAB PHASE II EXERCISE from  09/30/2017 in [place]  Date  09/30/17  Educator  DJ  Instruction Review Code  2- Demonstrated Understanding      Heart Parts/Heart Disease and PAD -Discuss the anatomy of the heart, the pathway of blood circulation through the heart, and these are affected by heart disease.   Stress I: Signs and Symptoms -Discuss the causes of stress, how stress may lead to anxiety and depression, and ways to limit stress.   Stress II: Relaxation -Discuss different types of relaxation techniques to limit stress.   Warning Signs of Stroke / TIA -Discuss definition of a stroke, what the signs and symptoms are of a stroke, and how to identify when someone is having stroke.   Knowledge Questionnaire Score: Knowledge Questionnaire Score - 08/24/17 1103      Knowledge Questionnaire Score   Pre Score  22/24       Core Components/Risk Factors/Patient Goals at Admission: Personal Goals and Risk Factors at Admission - 08/24/17 1133      Core Components/Risk Factors/Patient Goals on Admission    Weight Management  Weight Maintenance    Tobacco Cessation  -- Has already quit smoking cigarettes and cigars.     Personal Goal Other  Yes    Personal Goal  Get back to ADL's. amd playing golf.     Intervention  Attend CR 3 x week and supplement with home exercise 2 days week.     Expected Outcomes  Achieve personal goals.        Core Components/Risk Factors/Patient Goals Review:  Goals and Risk Factor Review    Row Name 09/30/17 1452             Core Components/Risk Factors/Patient Goals Review   Personal Goals Review  Weight Management/Obesity Get back to ADL's; play golf again.       Review  Patient has completed 16 sessions. He is doing well in the program with progression. He says he feels stronger and feels like he can play golf but his  cardiologist has not released him to play yet. He has more energy and feels the program has helped him. Will continue to monitor.         Expected Outcomes  Patient will complete the program meeting his personal goals.           Core Components/Risk Factors/Patient Goals at Discharge (Final Review):  Goals and Risk Factor Review - 09/30/17 1452      Core Components/Risk Factors/Patient Goals Review   Personal Goals Review  Weight Management/Obesity Get back to ADL's; play golf again.    Review  Patient has completed 16 sessions. He is doing well in the program with progression. He says he feels stronger and feels like he can play golf but his cardiologist has not released him to play yet. He has more energy and feels the program has helped him. Will continue to monitor.     Expected Outcomes  Patient will complete the program meeting his personal goals.        ITP Comments: ITP Comments    Row Name 08/24/17 1130 09/02/17 0756 10/15/17 1548       ITP Comments  Mr. Crescenzo is a pleasant 62 year old man. He is already walking daily at the [facility] sometimes twice a day. Patient is eager to get started.   Patient new to program. He has completed 4 sessions. Will continue to monitor for progress.   Patient graduated completing 17 sessions due insurance co-payments. 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 His exit walk test improved by 17.8%. His balance and flexibility also improved.         Comments: Patient graduated from [facility] today on 10/02/17 after completing 17 sessions. He did not complete the program due to insurance co-payments. He achieved LTG of 30 minutes of aerobic exercise at Max Met level of 5.8. All patients vitals are WNL. Patient has met with dietician. Discharge instruction has been reviewed in detail and patient stated an understanding of material given. Patient plans to continue exercising at home by walking. Cardiac Rehab staff will make f/u calls at 1 month, 6 months, and 1 year. Patient had no complaints of any abnormal S/S or pain on their exit  visit.

## 2017-10-16 ENCOUNTER — Encounter (HOSPITAL_COMMUNITY): Payer: Medicare HMO

## 2017-10-18 ENCOUNTER — Other Ambulatory Visit: Payer: Self-pay | Admitting: Cardiothoracic Surgery

## 2017-10-19 ENCOUNTER — Encounter (HOSPITAL_COMMUNITY): Payer: Medicare HMO

## 2017-10-21 ENCOUNTER — Encounter (HOSPITAL_COMMUNITY): Payer: Medicare HMO

## 2017-10-23 ENCOUNTER — Encounter (HOSPITAL_COMMUNITY): Payer: Medicare HMO

## 2017-10-26 ENCOUNTER — Encounter (HOSPITAL_COMMUNITY): Payer: Medicare HMO

## 2017-10-28 ENCOUNTER — Encounter (HOSPITAL_COMMUNITY): Payer: Medicare HMO

## 2017-10-30 ENCOUNTER — Encounter (HOSPITAL_COMMUNITY): Payer: Medicare HMO

## 2017-11-02 ENCOUNTER — Encounter (HOSPITAL_COMMUNITY): Payer: Medicare HMO

## 2017-11-04 ENCOUNTER — Encounter (HOSPITAL_COMMUNITY): Payer: Medicare HMO

## 2017-11-06 ENCOUNTER — Encounter (HOSPITAL_COMMUNITY): Payer: Medicare HMO

## 2017-11-09 ENCOUNTER — Encounter (HOSPITAL_COMMUNITY): Payer: Medicare HMO

## 2017-11-11 ENCOUNTER — Encounter (HOSPITAL_COMMUNITY): Payer: Medicare HMO

## 2017-11-13 ENCOUNTER — Encounter (HOSPITAL_COMMUNITY): Payer: Medicare HMO

## 2017-11-16 ENCOUNTER — Encounter (HOSPITAL_COMMUNITY): Payer: Medicare HMO

## 2017-11-18 ENCOUNTER — Encounter (HOSPITAL_COMMUNITY): Payer: Medicare HMO

## 2018-04-13 ENCOUNTER — Ambulatory Visit
Admit: 2018-04-13 | Discharge: 2018-04-14 | Payer: MEDICARE | Attending: Cardiovascular Disease | Primary: Cardiovascular Disease

## 2018-04-13 DIAGNOSIS — I25119 Atherosclerotic heart disease of native coronary artery with unspecified angina pectoris: Principal | ICD-10-CM

## 2018-04-13 MED ORDER — NITROGLYCERIN 0.4 MG SUBLINGUAL TABLET
ORAL_TABLET | SUBLINGUAL | 3 refills | 0.00000 days | Status: CP | PRN
Start: 2018-04-13 — End: 2019-04-13

## 2018-04-19 ENCOUNTER — Ambulatory Visit: Admit: 2018-04-19 | Discharge: 2018-04-20 | Payer: MEDICARE

## 2018-04-19 DIAGNOSIS — Z955 Presence of coronary angioplasty implant and graft: Secondary | ICD-10-CM

## 2018-04-19 HISTORY — PX: CARDIAC CATHETERIZATION: SHX172

## 2018-04-19 HISTORY — DX: Presence of coronary angioplasty implant and graft: Z95.5

## 2018-04-19 MED ORDER — LOSARTAN 50 MG TABLET
ORAL_TABLET | Freq: Every day | ORAL | 2 refills | 0 days | Status: SS
Start: 2018-04-19 — End: 2018-08-02

## 2018-04-22 HISTORY — PX: CORONARY ANGIOPLASTY WITH STENT PLACEMENT: SHX49

## 2018-05-06 ENCOUNTER — Ambulatory Visit
Admit: 2018-05-06 | Discharge: 2018-05-07 | Payer: MEDICARE | Attending: Cardiovascular Disease | Primary: Cardiovascular Disease

## 2018-05-06 DIAGNOSIS — I10 Essential (primary) hypertension: Secondary | ICD-10-CM

## 2018-05-06 DIAGNOSIS — E785 Hyperlipidemia, unspecified: Secondary | ICD-10-CM

## 2018-05-06 DIAGNOSIS — I2510 Atherosclerotic heart disease of native coronary artery without angina pectoris: Principal | ICD-10-CM

## 2018-05-06 MED ORDER — AMLODIPINE 10 MG TABLET
ORAL_TABLET | Freq: Every day | ORAL | 6 refills | 0 days | Status: CP
Start: 2018-05-06 — End: 2018-07-16

## 2018-05-18 MED ORDER — EZETIMIBE 10 MG TABLET
ORAL_TABLET | Freq: Every day | ORAL | 1 refills | 0 days | Status: CP
Start: 2018-05-18 — End: 2018-11-16

## 2018-06-30 DIAGNOSIS — Z9842 Cataract extraction status, left eye: Secondary | ICD-10-CM

## 2018-06-30 HISTORY — DX: Cataract extraction status, right eye: Z98.42

## 2018-07-15 ENCOUNTER — Ambulatory Visit
Admit: 2018-07-15 | Discharge: 2018-07-16 | Payer: MEDICARE | Attending: Cardiovascular Disease | Primary: Cardiovascular Disease

## 2018-07-15 DIAGNOSIS — E785 Hyperlipidemia, unspecified: Secondary | ICD-10-CM

## 2018-07-15 DIAGNOSIS — I10 Essential (primary) hypertension: Secondary | ICD-10-CM

## 2018-07-15 DIAGNOSIS — I2510 Atherosclerotic heart disease of native coronary artery without angina pectoris: Principal | ICD-10-CM

## 2018-07-16 MED ORDER — AMLODIPINE 10 MG TABLET
ORAL_TABLET | Freq: Every day | ORAL | 6 refills | 0.00000 days | Status: CP
Start: 2018-07-16 — End: 2019-01-20

## 2018-08-02 ENCOUNTER — Ambulatory Visit: Admit: 2018-08-02 | Discharge: 2018-08-02 | Payer: MEDICARE

## 2018-08-02 DIAGNOSIS — Z95828 Presence of other vascular implants and grafts: Secondary | ICD-10-CM

## 2018-08-02 HISTORY — PX: ILIAC ARTERY STENT: SHX1786

## 2018-08-02 HISTORY — DX: Presence of other vascular implants and grafts: Z95.828

## 2018-08-02 MED ORDER — LOSARTAN 50 MG TABLET
ORAL_TABLET | Freq: Every day | ORAL | 10 refills | 0 days | Status: CP
Start: 2018-08-02 — End: ?

## 2018-11-16 MED ORDER — EZETIMIBE 10 MG TABLET
ORAL_TABLET | Freq: Every day | ORAL | 1 refills | 0.00000 days | Status: CP
Start: 2018-11-16 — End: 2019-11-16

## 2018-11-16 MED ORDER — EZETIMIBE 10 MG TABLET: 10 mg | tablet | Freq: Every day | 2 refills | 0 days | Status: AC

## 2018-11-16 MED ORDER — EZETIMIBE 10 MG TABLET: 10 mg | tablet | Freq: Every day | 1 refills | 0 days | Status: AC

## 2019-01-20 ENCOUNTER — Ambulatory Visit
Admit: 2019-01-20 | Discharge: 2019-01-21 | Payer: MEDICARE | Attending: Cardiovascular Disease | Primary: Cardiovascular Disease

## 2019-01-20 DIAGNOSIS — I209 Angina pectoris, unspecified: Secondary | ICD-10-CM

## 2019-01-20 DIAGNOSIS — I10 Essential (primary) hypertension: Secondary | ICD-10-CM

## 2019-01-20 DIAGNOSIS — I771 Stricture of artery: Principal | ICD-10-CM

## 2019-01-20 DIAGNOSIS — I2510 Atherosclerotic heart disease of native coronary artery without angina pectoris: Secondary | ICD-10-CM

## 2019-01-20 DIAGNOSIS — E785 Hyperlipidemia, unspecified: Secondary | ICD-10-CM

## 2019-04-13 MED ORDER — AMLODIPINE 10 MG TABLET: 10 mg | tablet | Freq: Every day | 2 refills | 90 days | Status: AC

## 2019-05-31 ENCOUNTER — Encounter: Payer: Self-pay | Admitting: *Deleted

## 2019-05-31 ENCOUNTER — Other Ambulatory Visit: Payer: Self-pay

## 2019-06-02 ENCOUNTER — Other Ambulatory Visit
Admission: RE | Admit: 2019-06-02 | Discharge: 2019-06-02 | Disposition: A | Discharge status: Home / Self Care | Payer: Medicare HMO | Source: Ambulatory Visit | Attending: Ophthalmology | Admitting: Ophthalmology

## 2019-06-02 ENCOUNTER — Other Ambulatory Visit: Payer: Self-pay

## 2019-06-02 DIAGNOSIS — Z20828 Contact with and (suspected) exposure to other viral communicable diseases: Secondary | ICD-10-CM | POA: Insufficient documentation

## 2019-06-02 DIAGNOSIS — Z01818 Encounter for other preprocedural examination: Secondary | ICD-10-CM | POA: Insufficient documentation

## 2019-06-03 LAB — SARS CORONAVIRUS 2 (TAT 6-24 HRS): SARS Coronavirus 2: NEGATIVE

## 2019-06-03 NOTE — Discharge Instructions (Signed)

## 2019-06-06 ENCOUNTER — Encounter
Admission: RE | Disposition: A | Discharge status: Home / Self Care | Payer: Self-pay | Source: Home / Self Care | Attending: Ophthalmology

## 2019-06-06 ENCOUNTER — Other Ambulatory Visit: Payer: Self-pay

## 2019-06-06 ENCOUNTER — Ambulatory Visit: Payer: Medicare HMO | Admitting: Anesthesiology

## 2019-06-06 ENCOUNTER — Ambulatory Visit
Admission: RE | Admit: 2019-06-06 | Discharge: 2019-06-06 | Disposition: A | Discharge status: Home / Self Care | Payer: Medicare HMO | Attending: Ophthalmology | Admitting: Ophthalmology

## 2019-06-06 DIAGNOSIS — E1136 Type 2 diabetes mellitus with diabetic cataract: Secondary | ICD-10-CM | POA: Insufficient documentation

## 2019-06-06 DIAGNOSIS — E7800 Pure hypercholesterolemia, unspecified: Secondary | ICD-10-CM | POA: Diagnosis not present

## 2019-06-06 DIAGNOSIS — I10 Essential (primary) hypertension: Secondary | ICD-10-CM | POA: Diagnosis not present

## 2019-06-06 DIAGNOSIS — Z951 Presence of aortocoronary bypass graft: Secondary | ICD-10-CM | POA: Insufficient documentation

## 2019-06-06 DIAGNOSIS — Z87891 Personal history of nicotine dependence: Secondary | ICD-10-CM | POA: Insufficient documentation

## 2019-06-06 DIAGNOSIS — H2511 Age-related nuclear cataract, right eye: Secondary | ICD-10-CM | POA: Insufficient documentation

## 2019-06-06 DIAGNOSIS — Z885 Allergy status to narcotic agent status: Secondary | ICD-10-CM | POA: Diagnosis not present

## 2019-06-06 DIAGNOSIS — I2510 Atherosclerotic heart disease of native coronary artery without angina pectoris: Secondary | ICD-10-CM | POA: Insufficient documentation

## 2019-06-06 DIAGNOSIS — I252 Old myocardial infarction: Secondary | ICD-10-CM | POA: Diagnosis not present

## 2019-06-06 DIAGNOSIS — E1151 Type 2 diabetes mellitus with diabetic peripheral angiopathy without gangrene: Secondary | ICD-10-CM | POA: Insufficient documentation

## 2019-06-06 DIAGNOSIS — Z95820 Peripheral vascular angioplasty status with implants and grafts: Secondary | ICD-10-CM | POA: Insufficient documentation

## 2019-06-06 HISTORY — PX: CATARACT EXTRACTION W/PHACO: SHX586

## 2019-06-06 HISTORY — DX: Peripheral vascular disease, unspecified: I73.9

## 2019-06-06 HISTORY — DX: Presence of dental prosthetic device (complete) (partial): Z97.2

## 2019-06-06 HISTORY — DX: Unspecified osteoarthritis, unspecified site: M19.90

## 2019-06-06 LAB — GLUCOSE, CAPILLARY
Glucose-Capillary: 120 mg/dL — ABNORMAL HIGH (ref 70–99)
Glucose-Capillary: 130 mg/dL — ABNORMAL HIGH (ref 70–99)

## 2019-06-06 SURGERY — PHACOEMULSIFICATION, CATARACT, WITH IOL INSERTION
Anesthesia: Monitor Anesthesia Care | Site: Eye | Laterality: Right

## 2019-06-06 MED ORDER — FENTANYL CITRATE (PF) 100 MCG/2ML IJ SOLN
INTRAMUSCULAR | Status: DC | PRN
  Administered 2019-06-06: 50 ug via INTRAVENOUS

## 2019-06-06 MED ORDER — ARMC OPHTHALMIC DILATING DROPS
1.0000 "application " | OPHTHALMIC | Status: DC | PRN
  Administered 2019-06-06 (×3): 1 via OPHTHALMIC

## 2019-06-06 MED ORDER — MOXIFLOXACIN HCL 0.5 % OP SOLN
OPHTHALMIC | Status: DC | PRN
  Administered 2019-06-06: 0.2 mL via OPHTHALMIC

## 2019-06-06 MED ORDER — TETRACAINE HCL 0.5 % OP SOLN
1.0000 [drp] | OPHTHALMIC | Status: DC | PRN
  Administered 2019-06-06 (×3): 1 [drp] via OPHTHALMIC

## 2019-06-06 MED ORDER — SODIUM HYALURONATE 10 MG/ML IO SOLN
INTRAOCULAR | Status: DC | PRN
  Administered 2019-06-06: 0.55 mL via INTRAOCULAR

## 2019-06-06 MED ORDER — LIDOCAINE HCL (PF) 2 % IJ SOLN
INTRAOCULAR | Status: DC | PRN
  Administered 2019-06-06: 2 mL via INTRAOCULAR

## 2019-06-06 MED ORDER — MIDAZOLAM HCL 2 MG/2ML IJ SOLN
INTRAMUSCULAR | Status: DC | PRN
  Administered 2019-06-06 (×2): 1 mg via INTRAVENOUS

## 2019-06-06 MED ORDER — SODIUM HYALURONATE 23 MG/ML IO SOLN
INTRAOCULAR | Status: DC | PRN
  Administered 2019-06-06: 0.6 mL via INTRAOCULAR

## 2019-06-06 MED ORDER — EPINEPHRINE PF 1 MG/ML IJ SOLN
INTRAOCULAR | Status: DC | PRN
  Administered 2019-06-06: 57 mL via OPHTHALMIC

## 2019-06-06 SURGICAL SUPPLY — 19 items
CANNULA ANT/CHMB 27G (MISCELLANEOUS) ×2 IMPLANT
CANNULA ANT/CHMB 27GA (MISCELLANEOUS) ×6 IMPLANT
DISSECTOR HYDRO NUCLEUS 50X22 (MISCELLANEOUS) ×3 IMPLANT
GLOVE SURG LX 7.5 STRW (GLOVE) ×2
GLOVE SURG LX STRL 7.5 STRW (GLOVE) ×1 IMPLANT
GLOVE SURG SYN 8.5  E (GLOVE) ×2
GLOVE SURG SYN 8.5 E (GLOVE) ×1 IMPLANT
GLOVE SURG SYN 8.5 PF PI (GLOVE) ×1 IMPLANT
GOWN STRL REUS W/ TWL LRG LVL3 (GOWN DISPOSABLE) ×2 IMPLANT
GOWN STRL REUS W/TWL LRG LVL3 (GOWN DISPOSABLE) ×4
LENS IOL TECNIS ITEC 21.0 (Intraocular Lens) ×2 IMPLANT
MARKER SKIN DUAL TIP RULER LAB (MISCELLANEOUS) ×3 IMPLANT
PACK DR. KING ARMS (PACKS) ×3 IMPLANT
PACK EYE AFTER SURG (MISCELLANEOUS) ×3 IMPLANT
PACK OPTHALMIC (MISCELLANEOUS) ×3 IMPLANT
SYR 3ML LL SCALE MARK (SYRINGE) ×3 IMPLANT
SYR TB 1ML LUER SLIP (SYRINGE) ×3 IMPLANT
WATER STERILE IRR 250ML POUR (IV SOLUTION) ×3 IMPLANT
WIPE NON LINTING 3.25X3.25 (MISCELLANEOUS) ×3 IMPLANT

## 2019-06-06 NOTE — Anesthesia Postprocedure Evaluation (Signed)
Anesthesia Post Note  Patient: Curtis Sherman  Procedure(s) Performed: CATARACT EXTRACTION PHACO AND INTRAOCULAR LENS PLACEMENT (IOC) RIGHT DIABETIC, 1.06, 00:18.3 (Right Eye)     Patient location during evaluation: PACU Anesthesia Type: MAC Level of consciousness: awake and alert Pain management: pain level controlled Vital Signs Assessment: post-procedure vital signs reviewed and stable Respiratory status: spontaneous breathing, nonlabored ventilation, respiratory function stable and patient connected to nasal cannula oxygen Cardiovascular status: stable and blood pressure returned to baseline Postop Assessment: no apparent nausea or vomiting Anesthetic complications: no    Adele Barthel Chizuko Trine

## 2019-06-06 NOTE — Anesthesia Preprocedure Evaluation (Signed)
Anesthesia Evaluation  Patient identified by MRN, date of birth, ID band Patient awake    Airway Mallampati: II  TM Distance: >3 FB Neck ROM: Full    Dental  (+) Edentulous Upper, Edentulous Lower   Pulmonary former smoker,    Pulmonary exam normal        Cardiovascular hypertension, + Past MI (2018), + Cardiac Stents (2019, on Plavix), + CABG (4v in 2018) and + Peripheral Vascular Disease (2x lower extremity stents)  Normal cardiovascular exam     Neuro/Psych    GI/Hepatic negative GI ROS, Neg liver ROS,   Endo/Other  diabetes, Well Controlled, Type 2  Renal/GU negative Renal ROS     Musculoskeletal   Abdominal   Peds  Hematology negative hematology ROS (+)   Anesthesia Other Findings   Reproductive/Obstetrics                             Anesthesia Physical Anesthesia Plan  ASA: III  Anesthesia Plan: MAC   Post-op Pain Management:    Induction: Intravenous  PONV Risk Score and Plan: 1 and Midazolam and TIVA  Airway Management Planned: Nasal Cannula and Natural Airway  Additional Equipment: None  Intra-op Plan:   Post-operative Plan:   Informed Consent: I have reviewed the patients History and Physical, chart, labs and discussed the procedure including the risks, benefits and alternatives for the proposed anesthesia with the patient or authorized representative who has indicated his/her understanding and acceptance.       Plan Discussed with: CRNA  Anesthesia Plan Comments:         Anesthesia Quick Evaluation

## 2019-06-06 NOTE — Transfer of Care (Signed)
Immediate Anesthesia Transfer of Care Note  Patient: Curtis Sherman  Procedure(s) Performed: CATARACT EXTRACTION PHACO AND INTRAOCULAR LENS PLACEMENT (IOC) RIGHT DIABETIC, 1.06, 00:18.3 (Right Eye)  Patient Location: PACU  Anesthesia Type: MAC  Level of Consciousness: awake, alert  and patient cooperative  Airway and Oxygen Therapy: Patient Spontanous Breathing and Patient connected to supplemental oxygen  Post-op Assessment: Post-op Vital signs reviewed, Patient's Cardiovascular Status Stable, Respiratory Function Stable, Patent Airway and No signs of Nausea or vomiting  Post-op Vital Signs: Reviewed and stable  Complications: No apparent anesthesia complications

## 2019-06-06 NOTE — Op Note (Signed)
OPERATIVE NOTE  Curtis Sherman [id number] 06/06/2019   PREOPERATIVE DIAGNOSIS:  Nuclear sclerotic cataract right eye.  H25.11   POSTOPERATIVE DIAGNOSIS:    Nuclear sclerotic cataract right eye.     PROCEDURE:  Phacoemusification with posterior chamber intraocular lens placement of the right eye   LENS:   Implant Name Type Inv. Item Serial No. Manufacturer Lot No. LRB No. Used Action  LENS IOL DIOP 21.0 - [id number] Intraocular Lens LENS IOL DIOP 21.0 [id number] AMO  Right 1 Implanted       Procedure(s) with comments: CATARACT EXTRACTION PHACO AND INTRAOCULAR LENS PLACEMENT (IOC) RIGHT DIABETIC, 1.06, 00:18.3 (Right) - Diabetic - oral meds  PCB00 +21.0   ULTRASOUND TIME: 0 minutes 18 seconds.  CDE 1.06   SURGEON:  Benay Pillow, MD, MPH  ANESTHESIOLOGIST: Anesthesiologist: Page, Adele Barthel, MD CRNA: Silvana Newness, CRNA   ANESTHESIA:  Topical with tetracaine drops augmented with 1% preservative-free intracameral lidocaine.  ESTIMATED BLOOD LOSS: less than 1 mL.   COMPLICATIONS:  None.   DESCRIPTION OF PROCEDURE:  The patient was identified in the holding room and transported to the operating room and placed in the supine position under the operating microscope.  The right eye was identified as the operative eye and it was prepped and draped in the usual sterile ophthalmic fashion.   A 1.0 millimeter clear-corneal paracentesis was made at the 10:30 position. 0.5 ml of preservative-free 1% lidocaine with epinephrine was injected into the anterior chamber.  The anterior chamber was filled with Healon 5 viscoelastic.  A 2.4 millimeter keratome was used to make a near-clear corneal incision at the 8:00 position.  A curvilinear capsulorrhexis was made with a cystotome and capsulorrhexis forceps.  Balanced salt solution was used to hydrodissect and hydrodelineate the nucleus.   Phacoemulsification was then used in stop and chop fashion to remove the lens nucleus and epinucleus.  The  remaining cortex was then removed using the irrigation and aspiration handpiece. Healon was then placed into the capsular bag to distend it for lens placement.  A lens was then injected into the capsular bag.  The remaining viscoelastic was aspirated.   Wounds were hydrated with balanced salt solution.  The anterior chamber was inflated to a physiologic pressure with balanced salt solution.   Intracameral vigamox 0.1 mL undiluted was injected into the eye and a drop placed onto the ocular surface.  No wound leaks were noted.  The patient was taken to the recovery room in stable condition without complications of anesthesia or surgery  Benay Pillow 06/06/2019, 10:41 AM

## 2019-06-06 NOTE — H&P (Signed)

## 2019-06-07 ENCOUNTER — Encounter: Payer: Self-pay | Admitting: Ophthalmology

## 2019-06-16 ENCOUNTER — Encounter: Payer: Self-pay | Admitting: Ophthalmology

## 2019-06-16 ENCOUNTER — Other Ambulatory Visit: Payer: Self-pay

## 2019-06-21 NOTE — Discharge Instructions (Signed)

## 2019-06-23 ENCOUNTER — Other Ambulatory Visit
Admission: RE | Admit: 2019-06-23 | Discharge: 2019-06-23 | Disposition: A | Discharge status: Home / Self Care | Payer: Medicare HMO | Source: Ambulatory Visit | Attending: Ophthalmology | Admitting: Ophthalmology

## 2019-06-23 DIAGNOSIS — Z20828 Contact with and (suspected) exposure to other viral communicable diseases: Secondary | ICD-10-CM | POA: Diagnosis not present

## 2019-06-23 DIAGNOSIS — Z01812 Encounter for preprocedural laboratory examination: Secondary | ICD-10-CM | POA: Insufficient documentation

## 2019-06-23 LAB — SARS CORONAVIRUS 2 (TAT 6-24 HRS): SARS Coronavirus 2: NEGATIVE

## 2019-06-27 ENCOUNTER — Encounter: Payer: Self-pay | Admitting: Ophthalmology

## 2019-06-27 ENCOUNTER — Ambulatory Visit
Admission: RE | Admit: 2019-06-27 | Discharge: 2019-06-27 | Disposition: A | Discharge status: Home / Self Care | Payer: Medicare HMO | Attending: Ophthalmology | Admitting: Ophthalmology

## 2019-06-27 ENCOUNTER — Ambulatory Visit: Payer: Medicare HMO | Admitting: Anesthesiology

## 2019-06-27 ENCOUNTER — Encounter
Admission: RE | Disposition: A | Discharge status: Home / Self Care | Payer: Self-pay | Source: Home / Self Care | Attending: Ophthalmology

## 2019-06-27 ENCOUNTER — Other Ambulatory Visit: Payer: Self-pay

## 2019-06-27 DIAGNOSIS — H2512 Age-related nuclear cataract, left eye: Secondary | ICD-10-CM | POA: Insufficient documentation

## 2019-06-27 DIAGNOSIS — Z87891 Personal history of nicotine dependence: Secondary | ICD-10-CM | POA: Insufficient documentation

## 2019-06-27 DIAGNOSIS — Z981 Arthrodesis status: Secondary | ICD-10-CM | POA: Insufficient documentation

## 2019-06-27 DIAGNOSIS — I252 Old myocardial infarction: Secondary | ICD-10-CM | POA: Diagnosis not present

## 2019-06-27 DIAGNOSIS — K219 Gastro-esophageal reflux disease without esophagitis: Secondary | ICD-10-CM | POA: Insufficient documentation

## 2019-06-27 DIAGNOSIS — I2510 Atherosclerotic heart disease of native coronary artery without angina pectoris: Secondary | ICD-10-CM | POA: Diagnosis not present

## 2019-06-27 DIAGNOSIS — E119 Type 2 diabetes mellitus without complications: Secondary | ICD-10-CM | POA: Insufficient documentation

## 2019-06-27 DIAGNOSIS — I10 Essential (primary) hypertension: Secondary | ICD-10-CM | POA: Diagnosis not present

## 2019-06-27 HISTORY — PX: CATARACT EXTRACTION W/PHACO: SHX586

## 2019-06-27 LAB — GLUCOSE, CAPILLARY
Glucose-Capillary: 129 mg/dL — ABNORMAL HIGH (ref 70–99)
Glucose-Capillary: 140 mg/dL — ABNORMAL HIGH (ref 70–99)

## 2019-06-27 SURGERY — PHACOEMULSIFICATION, CATARACT, WITH IOL INSERTION
Anesthesia: Monitor Anesthesia Care | Site: Eye | Laterality: Left

## 2019-06-27 MED ORDER — ACETAMINOPHEN 160 MG/5ML PO SOLN: 325.0000 mg | ORAL | Status: DC | PRN

## 2019-06-27 MED ORDER — MOXIFLOXACIN HCL 0.5 % OP SOLN
OPHTHALMIC | Status: DC | PRN
  Administered 2019-06-27: 0.2 mL via OPHTHALMIC

## 2019-06-27 MED ORDER — SODIUM HYALURONATE 10 MG/ML IO SOLN
INTRAOCULAR | Status: DC | PRN
  Administered 2019-06-27: 0.55 mL via INTRAOCULAR

## 2019-06-27 MED ORDER — LIDOCAINE HCL (PF) 2 % IJ SOLN
INTRAOCULAR | Status: DC | PRN
  Administered 2019-06-27: 2 mL via INTRAOCULAR

## 2019-06-27 MED ORDER — MIDAZOLAM HCL 2 MG/2ML IJ SOLN
INTRAMUSCULAR | Status: DC | PRN
  Administered 2019-06-27 (×2): 1 mg via INTRAVENOUS

## 2019-06-27 MED ORDER — ARMC OPHTHALMIC DILATING DROPS
1.0000 "application " | OPHTHALMIC | Status: DC | PRN
  Administered 2019-06-27 (×3): 1 via OPHTHALMIC

## 2019-06-27 MED ORDER — TETRACAINE HCL 0.5 % OP SOLN
1.0000 [drp] | OPHTHALMIC | Status: DC | PRN
  Administered 2019-06-27 (×3): 1 [drp] via OPHTHALMIC

## 2019-06-27 MED ORDER — ACETAMINOPHEN 325 MG PO TABS: 325.0000 mg | ORAL_TABLET | ORAL | Status: DC | PRN

## 2019-06-27 MED ORDER — EPINEPHRINE PF 1 MG/ML IJ SOLN
INTRAOCULAR | Status: DC | PRN
  Administered 2019-06-27: 09:00:00 78 mL via OPHTHALMIC

## 2019-06-27 MED ORDER — SODIUM HYALURONATE 23 MG/ML IO SOLN
INTRAOCULAR | Status: DC | PRN
  Administered 2019-06-27: 0.6 mL via INTRAOCULAR

## 2019-06-27 MED ORDER — FENTANYL CITRATE (PF) 100 MCG/2ML IJ SOLN
INTRAMUSCULAR | Status: DC | PRN
  Administered 2019-06-27 (×2): 50 ug via INTRAVENOUS

## 2019-06-27 SURGICAL SUPPLY — 19 items
CANNULA ANT/CHMB 27G (MISCELLANEOUS) ×2 IMPLANT
CANNULA ANT/CHMB 27GA (MISCELLANEOUS) ×6 IMPLANT
DISSECTOR HYDRO NUCLEUS 50X22 (MISCELLANEOUS) ×3 IMPLANT
GLOVE SURG LX 7.5 STRW (GLOVE) ×2
GLOVE SURG LX STRL 7.5 STRW (GLOVE) ×1 IMPLANT
GLOVE SURG SYN 8.5  E (GLOVE) ×2
GLOVE SURG SYN 8.5 E (GLOVE) ×1 IMPLANT
GLOVE SURG SYN 8.5 PF PI (GLOVE) ×1 IMPLANT
GOWN STRL REUS W/ TWL LRG LVL3 (GOWN DISPOSABLE) ×2 IMPLANT
GOWN STRL REUS W/TWL LRG LVL3 (GOWN DISPOSABLE) ×4
LENS IOL TECNIS ITEC 21.0 (Intraocular Lens) ×2 IMPLANT
MARKER SKIN DUAL TIP RULER LAB (MISCELLANEOUS) ×3 IMPLANT
PACK DR. KING ARMS (PACKS) ×3 IMPLANT
PACK EYE AFTER SURG (MISCELLANEOUS) ×3 IMPLANT
PACK OPTHALMIC (MISCELLANEOUS) ×3 IMPLANT
SYR 3ML LL SCALE MARK (SYRINGE) ×3 IMPLANT
SYR TB 1ML LUER SLIP (SYRINGE) ×3 IMPLANT
WATER STERILE IRR 250ML POUR (IV SOLUTION) ×3 IMPLANT
WIPE NON LINTING 3.25X3.25 (MISCELLANEOUS) ×3 IMPLANT

## 2019-06-27 NOTE — Op Note (Signed)
OPERATIVE NOTE  Darril Patriarca [id number] 06/27/2019   PREOPERATIVE DIAGNOSIS:  Nuclear sclerotic cataract left eye.  H25.12   POSTOPERATIVE DIAGNOSIS:    Nuclear sclerotic cataract left eye.     PROCEDURE:  Phacoemusification with posterior chamber intraocular lens placement of the left eye   LENS:   Implant Name Type Inv. Item Serial No. Manufacturer Lot No. LRB No. Used Action  LENS IOL DIOP 21.0 - [id number] Intraocular Lens LENS IOL DIOP 21.0 [id number] AMO  Left 1 Wasted  LENS IOL DIOP 21.0 - [id number] Intraocular Lens LENS IOL DIOP 21.0 [id number] AMO  Left 1 Implanted      Procedure(s) with comments: CATARACT EXTRACTION PHACO AND INTRAOCULAR LENS PLACEMENT (IOC) LEFT DIABETIC 1.84,  00:23.0 (Left) - Diabetic - oral meds  PCB00 +21.0   ULTRASOUND TIME: 0 minutes 23 seconds.  CDE 1.84   SURGEON:  Benay Pillow, MD, MPH   ANESTHESIA:  Topical with tetracaine drops augmented with 1% preservative-free intracameral lidocaine.  ESTIMATED BLOOD LOSS: <1 mL   COMPLICATIONS:  None.   DESCRIPTION OF PROCEDURE:  The patient was identified in the holding room and transported to the operating room and placed in the supine position under the operating microscope.  The left eye was identified as the operative eye and it was prepped and draped in the usual sterile ophthalmic fashion.   A 1.0 millimeter clear-corneal paracentesis was made at the 5:00 position. 0.5 ml of preservative-free 1% lidocaine with epinephrine was injected into the anterior chamber.  The anterior chamber was filled with Healon 5 viscoelastic.  A 2.4 millimeter keratome was used to make a near-clear corneal incision at the 2:00 position.  A curvilinear capsulorrhexis was made with a cystotome and capsulorrhexis forceps.  Balanced salt solution was used to hydrodissect and hydrodelineate the nucleus.   Phacoemulsification was then used in stop and chop fashion to remove the lens nucleus and epinucleus.  The  remaining cortex was then removed using the irrigation and aspiration handpiece. Healon was then placed into the capsular bag to distend it for lens placement.  A lens was then injected into the capsular bag.  The remaining viscoelastic was aspirated.   Wounds were hydrated with balanced salt solution.  The anterior chamber was inflated to a physiologic pressure with balanced salt solution.  Intracameral vigamox 0.1 mL undiltued was injected into the eye and a drop placed onto the ocular surface.  No wound leaks were noted.  The patient was taken to the recovery room in stable condition without complications of anesthesia or surgery  Benay Pillow 06/27/2019, 9:00 AM

## 2019-06-27 NOTE — H&P (Signed)

## 2019-06-27 NOTE — Anesthesia Postprocedure Evaluation (Signed)
Anesthesia Post Note  Patient: Curtis Sherman  Procedure(s) Performed: CATARACT EXTRACTION PHACO AND INTRAOCULAR LENS PLACEMENT (IOC) LEFT DIABETIC 1.84,  00:23.0 (Left Eye)     Patient location during evaluation: PACU Anesthesia Type: MAC Level of consciousness: awake and alert Pain management: pain level controlled Vital Signs Assessment: post-procedure vital signs reviewed and stable Respiratory status: spontaneous breathing, nonlabored ventilation, respiratory function stable and patient connected to nasal cannula oxygen Cardiovascular status: stable and blood pressure returned to baseline Postop Assessment: no apparent nausea or vomiting Anesthetic complications: no    Trecia Rogers

## 2019-06-27 NOTE — Anesthesia Preprocedure Evaluation (Signed)
Anesthesia Evaluation  Patient identified by MRN, date of birth, ID band Patient awake    Reviewed: Allergy & Precautions, H&P , NPO status , Patient's Chart, lab work & pertinent test results, reviewed documented beta blocker date and time   Airway Mallampati: I  TM Distance: >3 FB Neck ROM: full    Dental  (+) Edentulous Upper, Edentulous Lower   Pulmonary former smoker,    Pulmonary exam normal breath sounds clear to auscultation       Cardiovascular Exercise Tolerance: Good hypertension, + CAD, + Past MI and + CABG  Normal cardiovascular exam Rhythm:regular Rate:Normal     Neuro/Psych negative neurological ROS  negative psych ROS   GI/Hepatic Neg liver ROS, GERD  ,  Endo/Other  diabetes, Type 2  Renal/GU negative Renal ROS  negative genitourinary   Musculoskeletal   Abdominal   Peds  Hematology negative hematology ROS (+)   Anesthesia Other Findings   Reproductive/Obstetrics negative OB ROS                             Anesthesia Physical Anesthesia Plan  ASA: III  Anesthesia Plan: MAC   Post-op Pain Management:    Induction:   PONV Risk Score and Plan:   Airway Management Planned:   Additional Equipment:   Intra-op Plan:   Post-operative Plan:   Informed Consent: I have reviewed the patients History and Physical, chart, labs and discussed the procedure including the risks, benefits and alternatives for the proposed anesthesia with the patient or authorized representative who has indicated his/her understanding and acceptance.     Dental Advisory Given  Plan Discussed with: CRNA  Anesthesia Plan Comments:         Anesthesia Quick Evaluation

## 2019-06-27 NOTE — Transfer of Care (Signed)
Immediate Anesthesia Transfer of Care Note  Patient: Curtis Sherman  Procedure(s) Performed: CATARACT EXTRACTION PHACO AND INTRAOCULAR LENS PLACEMENT (IOC) LEFT DIABETIC 1.84,  00:23.0 (Left Eye)  Patient Location: PACU  Anesthesia Type: MAC  Level of Consciousness: awake, alert  and patient cooperative  Airway and Oxygen Therapy: Patient Spontanous Breathing and Patient connected to supplemental oxygen  Post-op Assessment: Post-op Vital signs reviewed, Patient's Cardiovascular Status Stable, Respiratory Function Stable, Patent Airway and No signs of Nausea or vomiting  Post-op Vital Signs: Reviewed and stable  Complications: No apparent anesthesia complications

## 2019-06-28 ENCOUNTER — Encounter: Payer: Self-pay | Admitting: *Deleted

## 2019-07-06 MED ORDER — METOPROLOL TARTRATE 25 MG TABLET
ORAL_TABLET | Freq: Two times a day (BID) | ORAL | 0 refills | 180 days | Status: CP
Start: 2019-07-06 — End: ?

## 2019-07-06 MED ORDER — LOSARTAN 50 MG TABLET
ORAL_TABLET | Freq: Every day | ORAL | 10 refills | 30 days | Status: CP
Start: 2019-07-06 — End: ?

## 2019-09-08 ENCOUNTER — Ambulatory Visit
Admit: 2019-09-08 | Discharge: 2019-09-09 | Payer: MEDICARE | Attending: Cardiovascular Disease | Primary: Cardiovascular Disease

## 2019-09-08 DIAGNOSIS — E785 Hyperlipidemia, unspecified: Principal | ICD-10-CM

## 2019-09-08 DIAGNOSIS — I209 Angina pectoris, unspecified: Principal | ICD-10-CM

## 2019-09-08 DIAGNOSIS — I10 Essential (primary) hypertension: Principal | ICD-10-CM

## 2019-09-08 DIAGNOSIS — I2510 Atherosclerotic heart disease of native coronary artery without angina pectoris: Principal | ICD-10-CM

## 2019-09-30 IMAGING — CR DG CHEST 2V
2 series · 2 of 2 positions shown · non-contrast
Comparison: 06/23/2017

CLINICAL DATA: History of coronary bypass graft 1 month ago,
follow-up exam

EXAM:
CHEST  2 VIEW

[w chest pa]
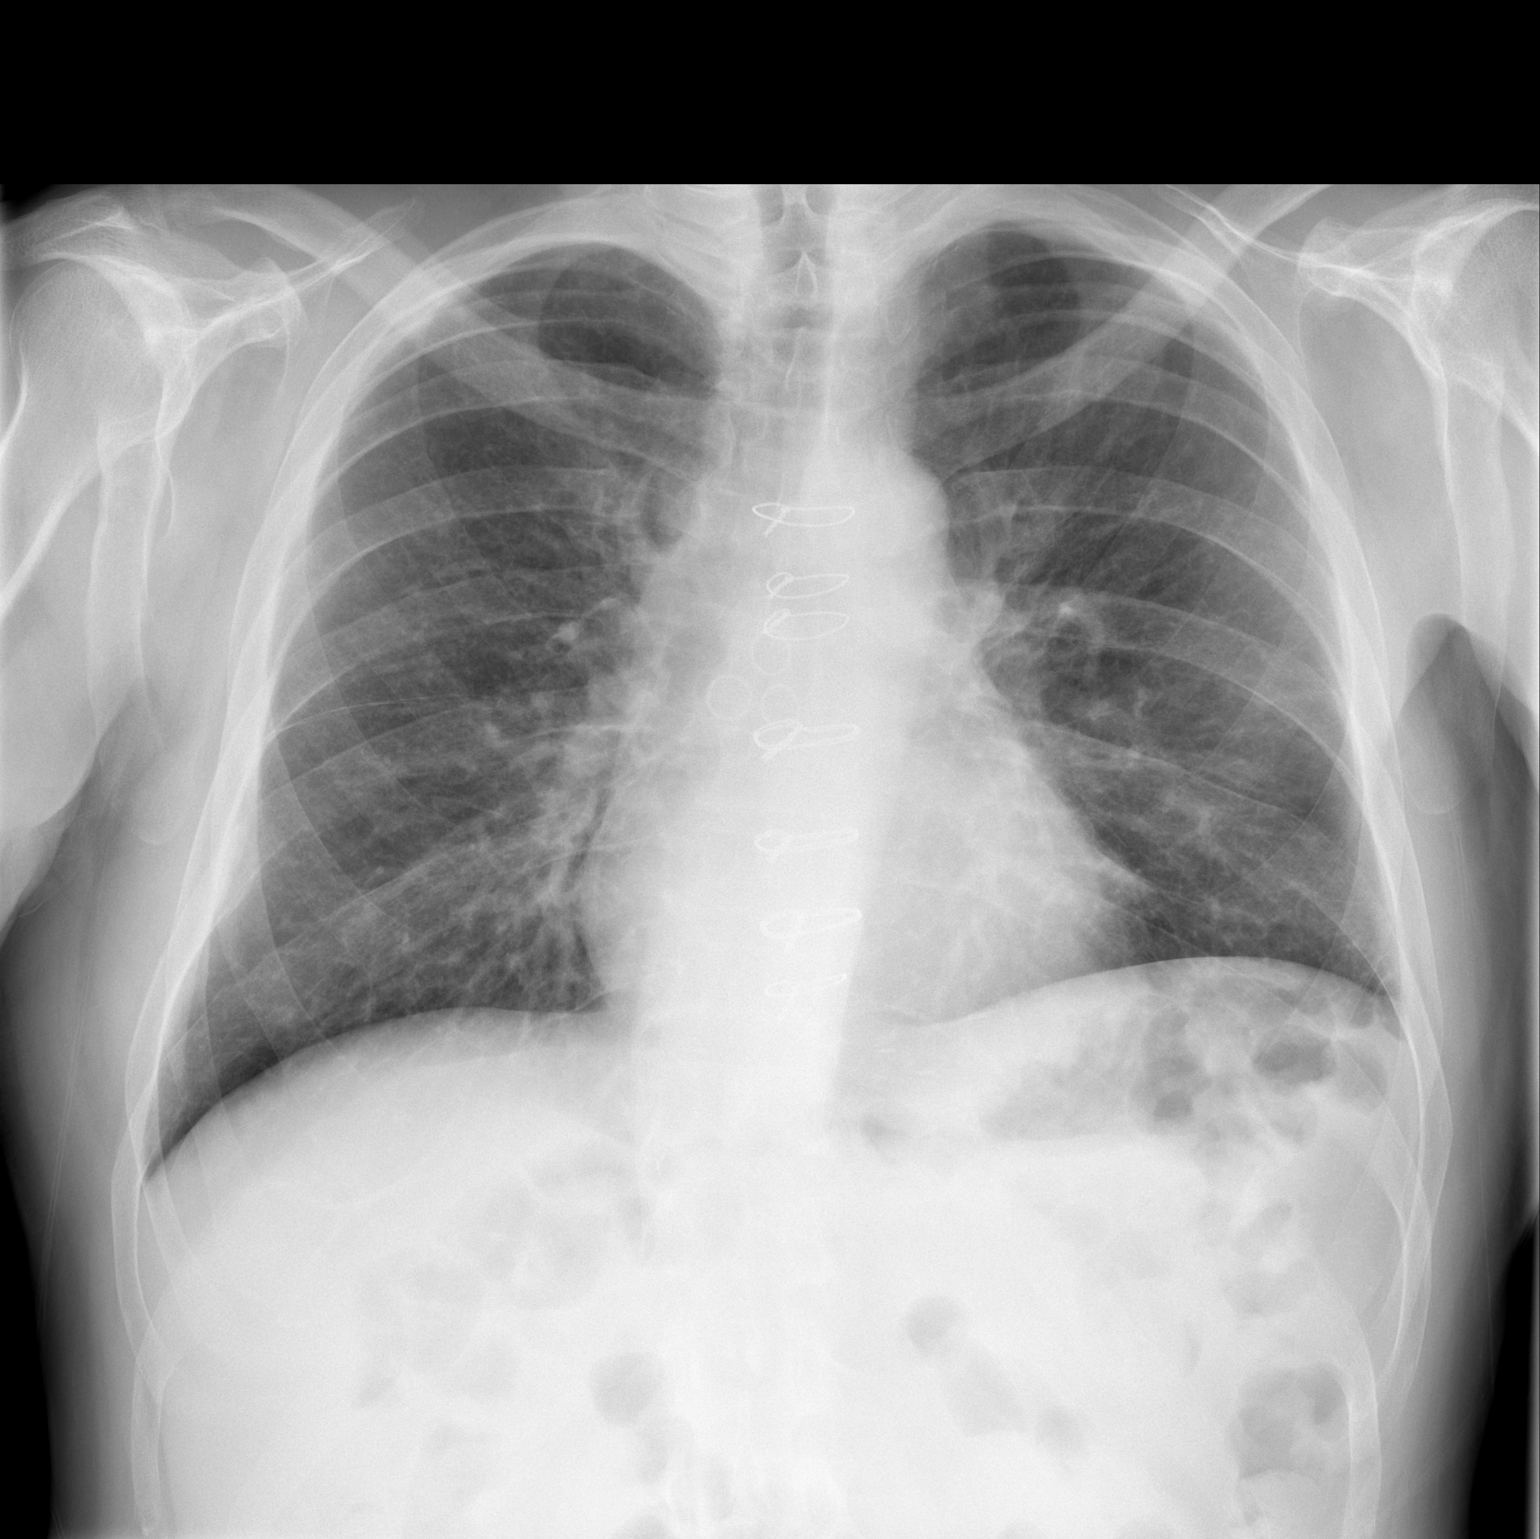

[w chest lat]
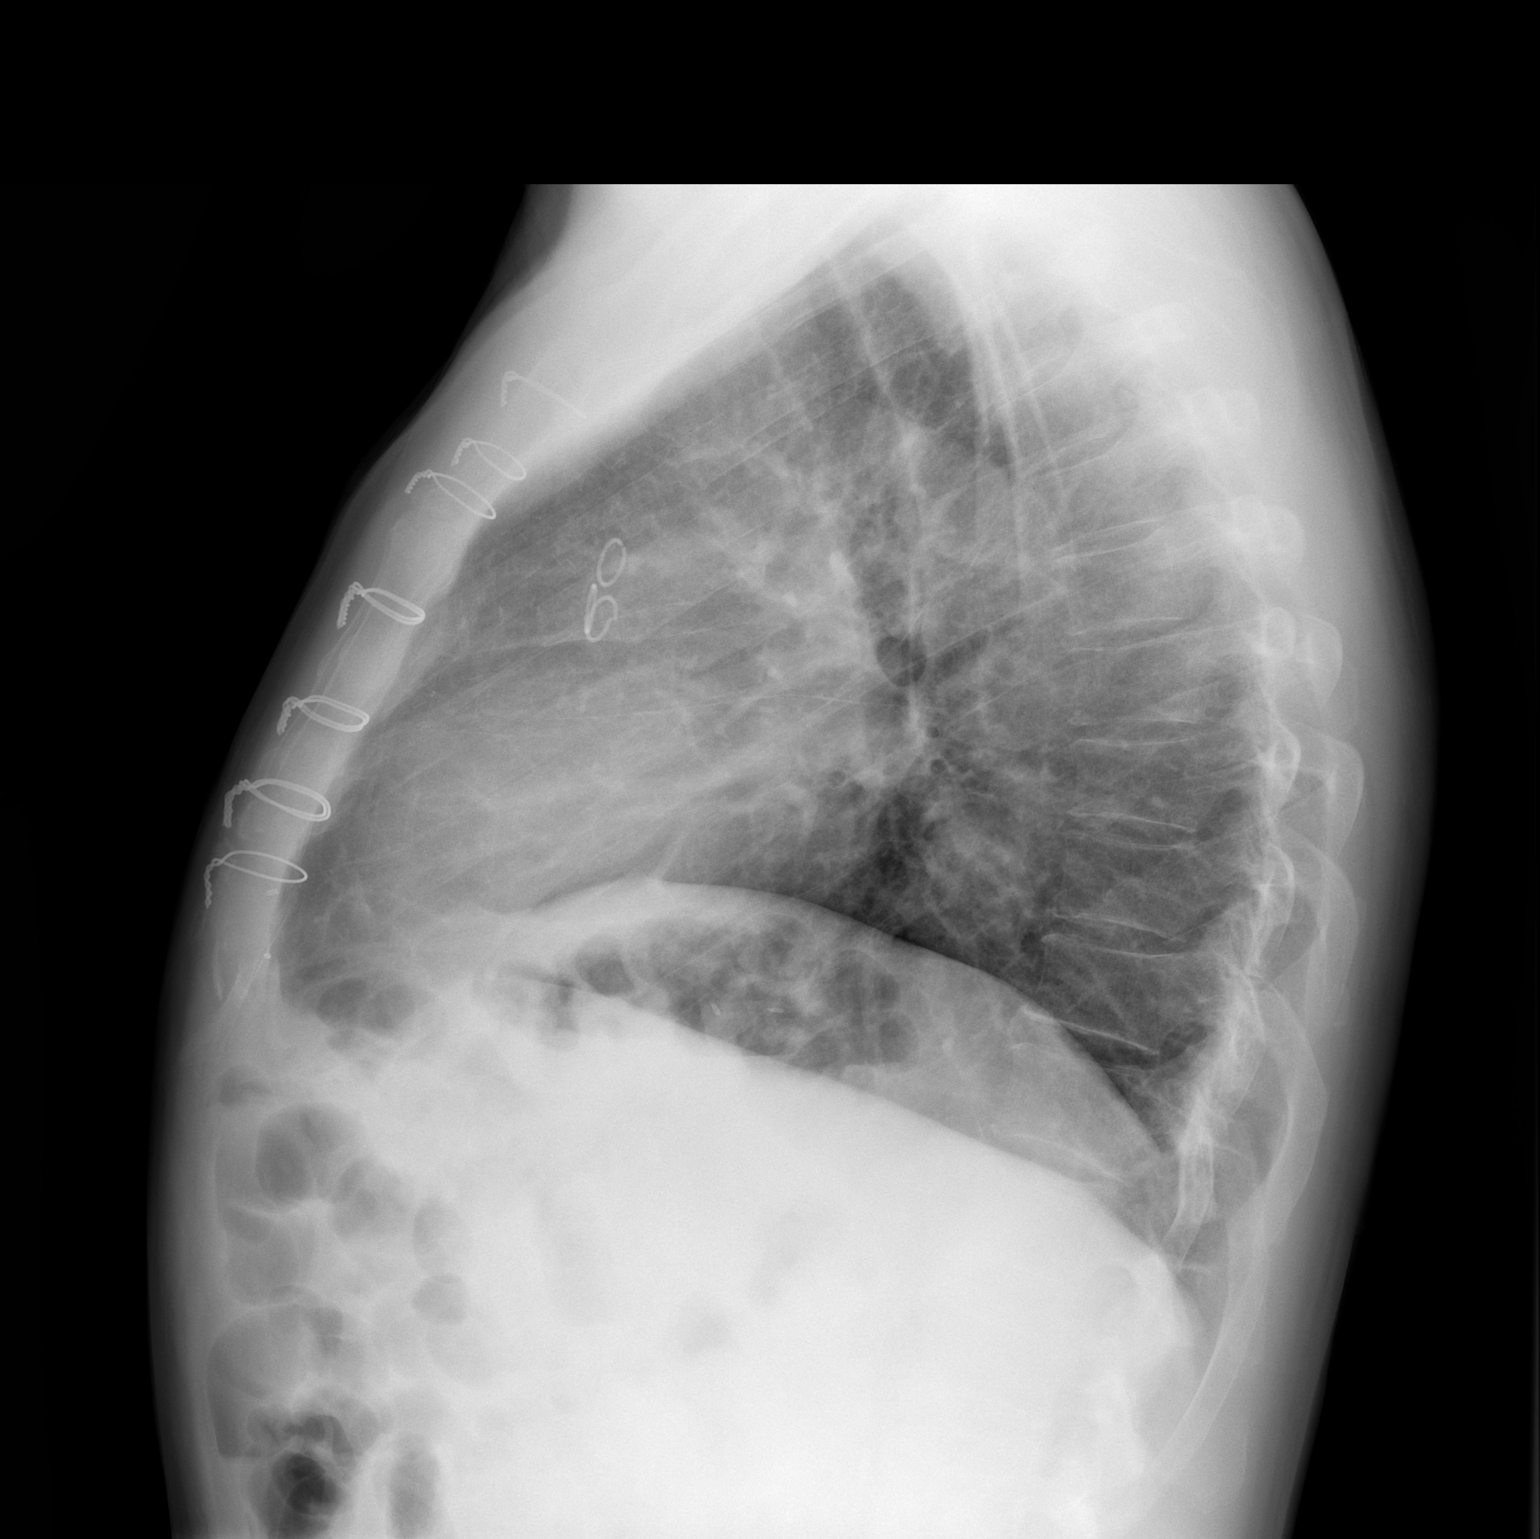

[2 of 2 positions shown; findings below may reference images not displayed]

FINDINGS: Cardiac shadow is within normal limits. Postsurgical changes are
again noted. Previously seen left basilar changes have resolved in
the interval. No focal infiltrate or sizable effusion is seen. No
bony abnormality is noted.
IMPRESSION: No acute abnormality noted.

## 2019-12-16 MED ORDER — EZETIMIBE 10 MG TABLET
ORAL_TABLET | Freq: Every day | ORAL | 2 refills | 90.00000 days | Status: CP
Start: 2019-12-16 — End: 2020-12-15

## 2020-01-06 MED ORDER — LOSARTAN 50 MG TABLET
ORAL_TABLET | Freq: Every day | ORAL | 2 refills | 90.00000 days | Status: CP
Start: 2020-01-06 — End: ?

## 2020-01-16 MED ORDER — METOPROLOL TARTRATE 25 MG TABLET
ORAL_TABLET | Freq: Two times a day (BID) | ORAL | 2 refills | 90.00000 days | Status: CP
Start: 2020-01-16 — End: ?

## 2020-01-30 MED ORDER — AMLODIPINE 10 MG TABLET
ORAL_TABLET | Freq: Every day | ORAL | 2 refills | 90 days | Status: CP
Start: 2020-01-30 — End: 2020-02-29

## 2020-02-29 DIAGNOSIS — U071 COVID-19: Secondary | ICD-10-CM

## 2020-02-29 HISTORY — DX: COVID-19: U07.1

## 2020-03-13 ENCOUNTER — Emergency Department: Payer: Medicare HMO

## 2020-03-13 ENCOUNTER — Encounter: Payer: Self-pay | Admitting: Emergency Medicine

## 2020-03-13 DIAGNOSIS — K219 Gastro-esophageal reflux disease without esophagitis: Secondary | ICD-10-CM | POA: Diagnosis present

## 2020-03-13 DIAGNOSIS — J440 Chronic obstructive pulmonary disease with acute lower respiratory infection: Secondary | ICD-10-CM | POA: Diagnosis present

## 2020-03-13 DIAGNOSIS — Z79899 Other long term (current) drug therapy: Secondary | ICD-10-CM

## 2020-03-13 DIAGNOSIS — Z955 Presence of coronary angioplasty implant and graft: Secondary | ICD-10-CM

## 2020-03-13 DIAGNOSIS — E785 Hyperlipidemia, unspecified: Secondary | ICD-10-CM | POA: Diagnosis present

## 2020-03-13 DIAGNOSIS — E1151 Type 2 diabetes mellitus with diabetic peripheral angiopathy without gangrene: Secondary | ICD-10-CM | POA: Diagnosis present

## 2020-03-13 DIAGNOSIS — D696 Thrombocytopenia, unspecified: Secondary | ICD-10-CM | POA: Diagnosis present

## 2020-03-13 DIAGNOSIS — Z8616 Personal history of COVID-19: Secondary | ICD-10-CM

## 2020-03-13 DIAGNOSIS — I252 Old myocardial infarction: Secondary | ICD-10-CM

## 2020-03-13 DIAGNOSIS — J1282 Pneumonia due to coronavirus disease 2019: Secondary | ICD-10-CM | POA: Diagnosis present

## 2020-03-13 DIAGNOSIS — U071 COVID-19: Secondary | ICD-10-CM | POA: Diagnosis not present

## 2020-03-13 DIAGNOSIS — T380X5A Adverse effect of glucocorticoids and synthetic analogues, initial encounter: Secondary | ICD-10-CM | POA: Diagnosis not present

## 2020-03-13 DIAGNOSIS — Z7902 Long term (current) use of antithrombotics/antiplatelets: Secondary | ICD-10-CM

## 2020-03-13 DIAGNOSIS — Z87891 Personal history of nicotine dependence: Secondary | ICD-10-CM

## 2020-03-13 DIAGNOSIS — E7800 Pure hypercholesterolemia, unspecified: Secondary | ICD-10-CM | POA: Diagnosis present

## 2020-03-13 DIAGNOSIS — J9601 Acute respiratory failure with hypoxia: Secondary | ICD-10-CM | POA: Diagnosis present

## 2020-03-13 DIAGNOSIS — E1165 Type 2 diabetes mellitus with hyperglycemia: Secondary | ICD-10-CM | POA: Diagnosis not present

## 2020-03-13 DIAGNOSIS — Z7984 Long term (current) use of oral hypoglycemic drugs: Secondary | ICD-10-CM

## 2020-03-13 DIAGNOSIS — Z7982 Long term (current) use of aspirin: Secondary | ICD-10-CM

## 2020-03-13 DIAGNOSIS — Z951 Presence of aortocoronary bypass graft: Secondary | ICD-10-CM

## 2020-03-13 DIAGNOSIS — I10 Essential (primary) hypertension: Secondary | ICD-10-CM | POA: Diagnosis present

## 2020-03-13 DIAGNOSIS — I2510 Atherosclerotic heart disease of native coronary artery without angina pectoris: Secondary | ICD-10-CM | POA: Diagnosis present

## 2020-03-13 HISTORY — DX: Personal history of COVID-19: Z86.16

## 2020-03-13 LAB — COMPREHENSIVE METABOLIC PANEL
ALT: 61 U/L — ABNORMAL HIGH (ref 0–44)
AST: 58 U/L — ABNORMAL HIGH (ref 15–41)
Albumin: 3.6 g/dL (ref 3.5–5.0)
Alkaline Phosphatase: 73 U/L (ref 38–126)
Anion gap: 11 (ref 5–15)
BUN: 17 mg/dL (ref 8–23)
CO2: 26 mmol/L (ref 22–32)
Calcium: 9.2 mg/dL (ref 8.9–10.3)
Chloride: 98 mmol/L (ref 98–111)
Creatinine, Ser: 0.81 mg/dL (ref 0.61–1.24)
GFR calc Af Amer: >60 mL/min (ref >60)
GFR calc non Af Amer: >60 mL/min (ref >60)
Glucose, Bld: 113 mg/dL — ABNORMAL HIGH (ref 70–99)
Potassium: 3.6 mmol/L (ref 3.5–5.1)
Sodium: 135 mmol/L (ref 135–145)
Total Bilirubin: 0.9 mg/dL (ref 0.3–1.2)
Total Protein: 7 g/dL (ref 6.5–8.1)

## 2020-03-13 LAB — FIBRIN DERIVATIVES D-DIMER (ARMC ONLY): Fibrin derivatives D-dimer (ARMC): 871.01 ng/mL (FEU) — ABNORMAL HIGH (ref 0.00–499.00)

## 2020-03-13 LAB — CBC WITH DIFFERENTIAL/PLATELET
Abs Immature Granulocytes: 0.01 10*3/uL (ref 0.00–0.07)
Basophils Absolute: 0 10*3/uL (ref 0.0–0.1)
Basophils Relative: 0 %
Eosinophils Absolute: 0 10*3/uL (ref 0.0–0.5)
Eosinophils Relative: 0 %
HCT: 46.2 % (ref 39.0–52.0)
Hemoglobin: 15.7 g/dL (ref 13.0–17.0)
Immature Granulocytes: 0 %
Lymphocytes Relative: 30 %
Lymphs Abs: 0.8 10*3/uL (ref 0.7–4.0)
MCH: 30.1 pg (ref 26.0–34.0)
MCHC: 34 g/dL (ref 30.0–36.0)
MCV: 88.5 fL (ref 80.0–100.0)
Monocytes Absolute: 0.2 10*3/uL (ref 0.1–1.0)
Monocytes Relative: 7 %
Neutro Abs: 1.6 10*3/uL — ABNORMAL LOW (ref 1.7–7.7)
Neutrophils Relative %: 63 %
Platelets: 128 10*3/uL — ABNORMAL LOW (ref 150–400)
RBC: 5.22 MIL/uL (ref 4.22–5.81)
RDW: 12.6 % (ref 11.5–15.5)
WBC: 2.6 10*3/uL — ABNORMAL LOW (ref 4.0–10.5)
nRBC: 0 % (ref 0.0–0.2)

## 2020-03-13 LAB — SARS CORONAVIRUS 2 BY RT PCR (HOSPITAL ORDER, PERFORMED IN ~~LOC~~ HOSPITAL LAB): SARS Coronavirus 2: POSITIVE — AB

## 2020-03-13 LAB — TROPONIN I (HIGH SENSITIVITY): Troponin I (High Sensitivity): 6 ng/L (ref <18)

## 2020-03-13 MED ORDER — ONDANSETRON 4 MG PO TBDP
4.0000 mg | ORAL_TABLET | Freq: Once | ORAL | Status: DC
  Filled 2020-03-13: qty 1

## 2020-03-13 NOTE — ED Triage Notes (Signed)
Pt reports N/V/D, increased weakness, fever and SOB that has increased. Symptoms started 9 days ago. Pt is 88-90 % RA sitting in triage. Pt placed on 2L O2.

## 2020-03-14 ENCOUNTER — Emergency Department: Payer: Medicare HMO

## 2020-03-14 ENCOUNTER — Other Ambulatory Visit: Payer: Self-pay

## 2020-03-14 ENCOUNTER — Inpatient Hospital Stay
Admission: EM | Admit: 2020-03-14 | Discharge: 2020-03-18 | DRG: 177 | Disposition: A | Discharge status: Home / Self Care | Payer: Medicare HMO | Attending: Hospitalist | Admitting: Hospitalist

## 2020-03-14 DIAGNOSIS — J1282 Pneumonia due to coronavirus disease 2019: Secondary | ICD-10-CM

## 2020-03-14 DIAGNOSIS — I10 Essential (primary) hypertension: Secondary | ICD-10-CM | POA: Diagnosis present

## 2020-03-14 DIAGNOSIS — E1151 Type 2 diabetes mellitus with diabetic peripheral angiopathy without gangrene: Secondary | ICD-10-CM | POA: Diagnosis present

## 2020-03-14 DIAGNOSIS — J9601 Acute respiratory failure with hypoxia: Secondary | ICD-10-CM | POA: Diagnosis present

## 2020-03-14 DIAGNOSIS — E7800 Pure hypercholesterolemia, unspecified: Secondary | ICD-10-CM | POA: Diagnosis present

## 2020-03-14 DIAGNOSIS — I2510 Atherosclerotic heart disease of native coronary artery without angina pectoris: Secondary | ICD-10-CM | POA: Diagnosis present

## 2020-03-14 DIAGNOSIS — R739 Hyperglycemia, unspecified: Secondary | ICD-10-CM | POA: Diagnosis present

## 2020-03-14 DIAGNOSIS — K219 Gastro-esophageal reflux disease without esophagitis: Secondary | ICD-10-CM | POA: Diagnosis present

## 2020-03-14 DIAGNOSIS — Z7902 Long term (current) use of antithrombotics/antiplatelets: Secondary | ICD-10-CM | POA: Diagnosis not present

## 2020-03-14 DIAGNOSIS — E785 Hyperlipidemia, unspecified: Secondary | ICD-10-CM | POA: Diagnosis present

## 2020-03-14 DIAGNOSIS — Z951 Presence of aortocoronary bypass graft: Secondary | ICD-10-CM | POA: Diagnosis not present

## 2020-03-14 DIAGNOSIS — U071 COVID-19: Secondary | ICD-10-CM | POA: Diagnosis present

## 2020-03-14 DIAGNOSIS — I252 Old myocardial infarction: Secondary | ICD-10-CM | POA: Diagnosis not present

## 2020-03-14 DIAGNOSIS — J440 Chronic obstructive pulmonary disease with acute lower respiratory infection: Secondary | ICD-10-CM | POA: Diagnosis present

## 2020-03-14 DIAGNOSIS — Z79899 Other long term (current) drug therapy: Secondary | ICD-10-CM | POA: Diagnosis not present

## 2020-03-14 DIAGNOSIS — D696 Thrombocytopenia, unspecified: Secondary | ICD-10-CM | POA: Diagnosis present

## 2020-03-14 DIAGNOSIS — E119 Type 2 diabetes mellitus without complications: Secondary | ICD-10-CM

## 2020-03-14 DIAGNOSIS — Z7984 Long term (current) use of oral hypoglycemic drugs: Secondary | ICD-10-CM | POA: Diagnosis not present

## 2020-03-14 DIAGNOSIS — Z7982 Long term (current) use of aspirin: Secondary | ICD-10-CM | POA: Diagnosis not present

## 2020-03-14 DIAGNOSIS — Z87891 Personal history of nicotine dependence: Secondary | ICD-10-CM | POA: Diagnosis not present

## 2020-03-14 DIAGNOSIS — Z955 Presence of coronary angioplasty implant and graft: Secondary | ICD-10-CM | POA: Diagnosis not present

## 2020-03-14 DIAGNOSIS — I739 Peripheral vascular disease, unspecified: Secondary | ICD-10-CM | POA: Diagnosis present

## 2020-03-14 DIAGNOSIS — R7401 Elevation of levels of liver transaminase levels: Secondary | ICD-10-CM | POA: Diagnosis present

## 2020-03-14 DIAGNOSIS — T380X5A Adverse effect of glucocorticoids and synthetic analogues, initial encounter: Secondary | ICD-10-CM | POA: Diagnosis not present

## 2020-03-14 DIAGNOSIS — E1165 Type 2 diabetes mellitus with hyperglycemia: Secondary | ICD-10-CM | POA: Diagnosis not present

## 2020-03-14 HISTORY — DX: Type 2 diabetes mellitus without complications: E11.9

## 2020-03-14 LAB — HIV ANTIBODY (ROUTINE TESTING W REFLEX): HIV Screen 4th Generation wRfx: NONREACTIVE

## 2020-03-14 LAB — FERRITIN: Ferritin: 495 ng/mL — ABNORMAL HIGH (ref 24–336)

## 2020-03-14 LAB — LACTATE DEHYDROGENASE: LDH: 267 U/L — ABNORMAL HIGH (ref 98–192)

## 2020-03-14 LAB — GLUCOSE, CAPILLARY
Glucose-Capillary: 165 mg/dL — ABNORMAL HIGH (ref 70–99)
Glucose-Capillary: 181 mg/dL — ABNORMAL HIGH (ref 70–99)
Glucose-Capillary: 171 mg/dL — ABNORMAL HIGH (ref 70–99)
Glucose-Capillary: 169 mg/dL — ABNORMAL HIGH (ref 70–99)

## 2020-03-14 LAB — FIBRINOGEN: Fibrinogen: 697 mg/dL — ABNORMAL HIGH (ref 210–475)

## 2020-03-14 LAB — TROPONIN I (HIGH SENSITIVITY)
Troponin I (High Sensitivity): 7 ng/L (ref <18)
Troponin I (High Sensitivity): 6 ng/L

## 2020-03-14 LAB — BRAIN NATRIURETIC PEPTIDE: B Natriuretic Peptide: 37.9 pg/mL (ref 0.0–100.0)

## 2020-03-14 LAB — PROCALCITONIN: Procalcitonin: <0.1 ng/mL

## 2020-03-14 LAB — C-REACTIVE PROTEIN: CRP: 5.8 mg/dL — ABNORMAL HIGH

## 2020-03-14 MED ORDER — LOSARTAN POTASSIUM 50 MG PO TABS
75.0000 mg | ORAL_TABLET | Freq: Every day | ORAL | Status: DC
  Administered 2020-03-14 – 2020-03-18 (×4): 75 mg via ORAL
  Filled 2020-03-14 (×5): qty 1

## 2020-03-14 MED ORDER — DEXAMETHASONE SODIUM PHOSPHATE 10 MG/ML IJ SOLN
10.0000 mg | Freq: Once | INTRAMUSCULAR | Status: AC
  Administered 2020-03-14: 10 mg via INTRAVENOUS
  Filled 2020-03-14: qty 1

## 2020-03-14 MED ORDER — METOPROLOL TARTRATE 25 MG PO TABS
12.5000 mg | ORAL_TABLET | Freq: Two times a day (BID) | ORAL | Status: DC
  Administered 2020-03-14 (×2): 12.5 mg via ORAL
  Filled 2020-03-14 (×3): qty 1

## 2020-03-14 MED ORDER — EZETIMIBE 10 MG PO TABS
10.0000 mg | ORAL_TABLET | Freq: Every day | ORAL | Status: DC
  Administered 2020-03-14 – 2020-03-18 (×5): 10 mg via ORAL
  Filled 2020-03-14 (×6): qty 1

## 2020-03-14 MED ORDER — SODIUM CHLORIDE 0.9 % IV SOLN
200.0000 mg | Freq: Once | INTRAVENOUS | Status: DC
  Administered 2020-03-14: 200 mg via INTRAVENOUS

## 2020-03-14 MED ORDER — VITAMIN D 25 MCG (1000 UNIT) PO TABS
1000.0000 [IU] | ORAL_TABLET | Freq: Every day | ORAL | Status: DC
  Administered 2020-03-14 – 2020-03-18 (×5): 1000 [IU] via ORAL
  Filled 2020-03-14 (×6): qty 1

## 2020-03-14 MED ORDER — ONDANSETRON HCL 4 MG PO TABS: 4.0000 mg | ORAL_TABLET | Freq: Four times a day (QID) | ORAL | Status: DC | PRN

## 2020-03-14 MED ORDER — SODIUM CHLORIDE 0.9 % IV SOLN
200.0000 mg | Freq: Once | INTRAVENOUS | Status: DC
  Filled 2020-03-14: qty 40

## 2020-03-14 MED ORDER — PREDNISONE 50 MG PO TABS
50.0000 mg | ORAL_TABLET | Freq: Every day | ORAL | Status: DC
  Administered 2020-03-17 – 2020-03-18 (×2): 50 mg via ORAL
  Filled 2020-03-14 (×2): qty 1

## 2020-03-14 MED ORDER — ACETAMINOPHEN 325 MG PO TABS: 650.0000 mg | ORAL_TABLET | Freq: Four times a day (QID) | ORAL | Status: DC | PRN

## 2020-03-14 MED ORDER — RISAQUAD PO CAPS
1.0000 | ORAL_CAPSULE | Freq: Every day | ORAL | Status: DC
  Administered 2020-03-15 – 2020-03-18 (×4): 1 via ORAL
  Filled 2020-03-14 (×4): qty 1

## 2020-03-14 MED ORDER — INSULIN DETEMIR 100 UNIT/ML ~~LOC~~ SOLN
0.1500 [IU]/kg | Freq: Two times a day (BID) | SUBCUTANEOUS | Status: DC
  Administered 2020-03-14 – 2020-03-17 (×8): 13 [IU] via SUBCUTANEOUS
  Filled 2020-03-14 (×10): qty 0.13

## 2020-03-14 MED ORDER — SODIUM CHLORIDE 0.9 % IV SOLN
1.0000 g | Freq: Once | INTRAVENOUS | Status: AC
  Administered 2020-03-14: 1 g via INTRAVENOUS
  Filled 2020-03-14: qty 10

## 2020-03-14 MED ORDER — SODIUM CHLORIDE 0.9 % IV SOLN
500.0000 mg | Freq: Once | INTRAVENOUS | Status: AC
  Administered 2020-03-14: 500 mg via INTRAVENOUS
  Filled 2020-03-14: qty 500

## 2020-03-14 MED ORDER — GUAIFENESIN-DM 100-10 MG/5ML PO SYRP
10.0000 mL | ORAL_SOLUTION | ORAL | Status: DC | PRN
  Filled 2020-03-14: qty 10

## 2020-03-14 MED ORDER — SODIUM CHLORIDE 0.9 % IV SOLN: INTRAVENOUS | Status: DC

## 2020-03-14 MED ORDER — ASCORBIC ACID 500 MG PO TABS
500.0000 mg | ORAL_TABLET | Freq: Every day | ORAL | Status: DC
  Administered 2020-03-14 – 2020-03-18 (×5): 500 mg via ORAL
  Filled 2020-03-14 (×5): qty 1

## 2020-03-14 MED ORDER — ASPIRIN EC 81 MG PO TBEC
81.0000 mg | DELAYED_RELEASE_TABLET | Freq: Every day | ORAL | Status: DC
  Administered 2020-03-14 – 2020-03-18 (×5): 81 mg via ORAL
  Filled 2020-03-14 (×5): qty 1

## 2020-03-14 MED ORDER — ENOXAPARIN SODIUM 40 MG/0.4ML ~~LOC~~ SOLN
40.0000 mg | SUBCUTANEOUS | Status: DC
  Administered 2020-03-14 – 2020-03-18 (×5): 40 mg via SUBCUTANEOUS
  Filled 2020-03-14 (×5): qty 0.4

## 2020-03-14 MED ORDER — LINAGLIPTIN 5 MG PO TABS
5.0000 mg | ORAL_TABLET | Freq: Every day | ORAL | Status: DC
  Filled 2020-03-14: qty 1

## 2020-03-14 MED ORDER — SODIUM CHLORIDE 0.9 % IV SOLN
100.0000 mg | Freq: Every day | INTRAVENOUS | Status: AC
  Administered 2020-03-15 – 2020-03-18 (×4): 100 mg via INTRAVENOUS
  Filled 2020-03-14 (×4): qty 20

## 2020-03-14 MED ORDER — ZINC SULFATE 220 (50 ZN) MG PO CAPS
220.0000 mg | ORAL_CAPSULE | Freq: Every day | ORAL | Status: DC
  Administered 2020-03-14 – 2020-03-18 (×5): 220 mg via ORAL
  Filled 2020-03-14 (×5): qty 1

## 2020-03-14 MED ORDER — AMLODIPINE BESYLATE 10 MG PO TABS
10.0000 mg | ORAL_TABLET | Freq: Every day | ORAL | Status: DC
  Administered 2020-03-14 – 2020-03-18 (×4): 10 mg via ORAL
  Filled 2020-03-14 (×5): qty 1

## 2020-03-14 MED ORDER — SODIUM CHLORIDE 0.9 % IV SOLN: 100.0000 mg | Freq: Every day | INTRAVENOUS | Status: DC

## 2020-03-14 MED ORDER — ONDANSETRON HCL 4 MG/2ML IJ SOLN: 4.0000 mg | Freq: Four times a day (QID) | INTRAMUSCULAR | Status: DC | PRN

## 2020-03-14 MED ORDER — FAMOTIDINE 20 MG PO TABS
20.0000 mg | ORAL_TABLET | Freq: Two times a day (BID) | ORAL | Status: DC
  Administered 2020-03-14 – 2020-03-16 (×5): 20 mg via ORAL
  Filled 2020-03-14 (×5): qty 1

## 2020-03-14 MED ORDER — SODIUM CHLORIDE 0.9 % IV BOLUS
1000.0000 mL | Freq: Once | INTRAVENOUS | Status: AC
  Administered 2020-03-14: 1000 mL via INTRAVENOUS

## 2020-03-14 MED ORDER — GUAIFENESIN ER 600 MG PO TB12
600.0000 mg | ORAL_TABLET | Freq: Two times a day (BID) | ORAL | Status: DC
  Administered 2020-03-14 – 2020-03-18 (×9): 600 mg via ORAL
  Filled 2020-03-14 (×9): qty 1

## 2020-03-14 MED ORDER — IOHEXOL 350 MG/ML SOLN
75.0000 mL | Freq: Once | INTRAVENOUS | Status: AC | PRN
  Administered 2020-03-14: 75 mL via INTRAVENOUS

## 2020-03-14 MED ORDER — CULTURELLE PO CAPS: ORAL_CAPSULE | Freq: Every day | ORAL | Status: DC

## 2020-03-14 MED ORDER — MAGNESIUM HYDROXIDE 400 MG/5ML PO SUSP
30.0000 mL | Freq: Every day | ORAL | Status: DC | PRN
  Filled 2020-03-14: qty 30

## 2020-03-14 MED ORDER — PANTOPRAZOLE SODIUM 40 MG PO TBEC
40.0000 mg | DELAYED_RELEASE_TABLET | Freq: Every day | ORAL | Status: DC
  Administered 2020-03-14 – 2020-03-18 (×5): 40 mg via ORAL
  Filled 2020-03-14 (×5): qty 1

## 2020-03-14 MED ORDER — ROSUVASTATIN CALCIUM 20 MG PO TABS
40.0000 mg | ORAL_TABLET | Freq: Every day | ORAL | Status: DC
  Administered 2020-03-14 – 2020-03-17 (×4): 40 mg via ORAL
  Filled 2020-03-14 (×4): qty 2

## 2020-03-14 MED ORDER — METHYLPREDNISOLONE SODIUM SUCC 125 MG IJ SOLR
1.0000 mg/kg | Freq: Two times a day (BID) | INTRAMUSCULAR | Status: AC
  Administered 2020-03-14 – 2020-03-16 (×5): 83.75 mg via INTRAVENOUS
  Filled 2020-03-14 (×7): qty 2

## 2020-03-14 MED ORDER — BARICITINIB 2 MG PO TABS
4.0000 mg | ORAL_TABLET | Freq: Every day | ORAL | Status: DC
  Administered 2020-03-14 – 2020-03-18 (×5): 4 mg via ORAL
  Filled 2020-03-14 (×5): qty 2

## 2020-03-14 MED ORDER — TRAZODONE HCL 50 MG PO TABS: 25.0000 mg | ORAL_TABLET | Freq: Every evening | ORAL | Status: DC | PRN

## 2020-03-14 MED ORDER — INSULIN ASPART 100 UNIT/ML ~~LOC~~ SOLN
0.0000 [IU] | Freq: Three times a day (TID) | SUBCUTANEOUS | Status: DC
  Administered 2020-03-14 (×2): 4 [IU] via SUBCUTANEOUS
  Administered 2020-03-14: 22:00:00 3 [IU] via SUBCUTANEOUS
  Administered 2020-03-14 – 2020-03-15 (×4): 4 [IU] via SUBCUTANEOUS
  Administered 2020-03-15: 3 [IU] via SUBCUTANEOUS
  Administered 2020-03-16 (×4): 4 [IU] via SUBCUTANEOUS
  Administered 2020-03-17: 18:00:00 3 [IU] via SUBCUTANEOUS
  Administered 2020-03-17 (×2): 4 [IU] via SUBCUTANEOUS
  Administered 2020-03-17: 7 [IU] via SUBCUTANEOUS
  Filled 2020-03-14 (×16): qty 1

## 2020-03-14 NOTE — ED Provider Notes (Addendum)
[facility] Emergency Department Provider Note  ____________________________________________   First MD Initiated Contact with Patient 03/14/20 [phone]     (approximate)  I have reviewed the triage vital signs and the nursing notes.   HISTORY  Chief Complaint Cough and Shortness of Breath    HPI Curtis Sherman is a 64 y.o. male with below list of previous medical conditions including hypertension and diabetes presents to the emergency department with generalized weakness fever shortness of breath nonproductive cough nausea diarrhea x9 days.  Patient states that his oxygen saturation today at home was in the 70s.  Currently on 3 L via nasal cannula patient satting 92%.  Patient is unvaccinated        Past Medical History:  Diagnosis Date  . Arthritis    right foot  . Diabetes mellitus without complication (HCC)   . GERD (gastroesophageal reflux disease)   . High cholesterol   . Peripheral vascular disease (HCC)   . S/P insertion of iliac artery stent 08/02/2018   bilateral  . S/P right coronary artery (RCA) stent placement 04/19/2018   x2    . STEMI (ST elevation myocardial infarction) (HCC) 06/17/2017  . Wears dentures    full upper, partial lower    Patient Active Problem List   Diagnosis Date Noted  . 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 STEMI (ST elevation myocardial infarction) (HCC) 06/18/2017  . Chest pain, unspecified 06/17/2017  . GERD (gastroesophageal reflux disease) 06/17/2017  . Hyperlipidemia, unspecified 06/17/2017  . HTN (hypertension) 06/17/2017  . Angina at rest [facility]) 06/17/2017  . Chronic back pain 01/31/2015  . Diet-controlled type 2 diabetes mellitus (HCC) 08/01/2014    Past Surgical History:  Procedure Laterality Date  . APPENDECTOMY    . BACK SURGERY     lumbar  x2  . CARDIAC CATHETERIZATION  04/19/2018   2 stents  . CATARACT EXTRACTION W/PHACO Right 06/06/2019   Procedure: CATARACT EXTRACTION PHACO AND INTRAOCULAR  LENS PLACEMENT (IOC) RIGHT DIABETIC, 1.06, 00:18.3;  Surgeon: Nevada Crane, MD;  Location: [facility];  Service: Ophthalmology;  Laterality: Right;  Diabetic - oral meds  . CATARACT EXTRACTION W/PHACO Left 06/27/2019   Procedure: CATARACT EXTRACTION PHACO AND INTRAOCULAR LENS PLACEMENT (IOC) LEFT DIABETIC 1.84,  00:23.0;  Surgeon: Nevada Crane, MD;  Location: [facility];  Service: Ophthalmology;  Laterality: Left;  Diabetic - oral meds  . CORONARY ARTERY BYPASS GRAFT N/A 06/19/2017   Procedure: CORONARY ARTERY BYPASS GRAFTING (CABG) x 3 WITH RIGHT ENDOVEIN HARVESTING;  Surgeon: Delight Ovens, MD;  Location: [facility] OR;  Service: Open Heart Surgery;  Laterality: N/A;  . CORONARY/GRAFT ACUTE MI REVASCULARIZATION N/A 06/17/2017   Procedure: Coronary Angiography;  Surgeon: Marcina Millard, MD;  Location: [facility] INVASIVE CV LAB;  Service: Cardiovascular;  Laterality: N/A;  . ESOPHAGOGASTRODUODENOSCOPY (EGD) WITH PROPOFOL N/A 12/25/2014   Procedure: ESOPHAGOGASTRODUODENOSCOPY (EGD) WITH PROPOFOL;  Surgeon: Scot Jun, MD;  Location: [facility] ENDOSCOPY;  Service: Endoscopy;  Laterality: N/A;  . FRACTURE SURGERY    . HERNIA REPAIR    . LEFT HEART CATH AND CORONARY ANGIOGRAPHY N/A 06/17/2017   Procedure: LEFT HEART CATH;  Surgeon: Marcina Millard, MD;  Location: [facility] INVASIVE CV LAB;  Service: Cardiovascular;  Laterality: N/A;  . NISSEN FUNDOPLICATION      Prior to Admission medications   Medication Sig Start Date End Date Taking? Authorizing Provider  acetaminophen (TYLENOL) 325 MG tablet Take 2 tablets (650 mg total) by mouth every 6 (six)  hours as needed for mild pain. 06/24/17   Ardelle Balls, PA-C  aspirin EC 81 MG EC tablet Take 1 tablet (81 mg total) by mouth daily. 06/24/17   Ardelle Balls, PA-C  cephALEXin (KEFLEX) 500 MG capsule Take 500 mg by mouth 3 (three) times daily.    [provider]  clopidogrel (PLAVIX) 75 MG tablet TAKE 1  TABLET BY MOUTH ONCE DAILY 08/16/17   Donata Clay, Theron Arista, MD  diphenhydrAMINE (BENADRYL) 25 mg capsule Take 25 mg by mouth at bedtime as needed for itching.    [provider]  ezetimibe (ZETIA) 10 MG tablet Take 10 mg by mouth daily.    [provider]  Lactobacillus Rhamnosus, GG, (CULTURELLE PO) Take by mouth daily.    [provider]  losartan (COZAAR) 50 MG tablet Take 75 mg by mouth daily.    [provider]  metFORMIN (GLUCOPHAGE) 500 MG tablet Take by mouth 2 (two) times daily with a meal.    [provider]  metoprolol tartrate (LOPRESSOR) 25 MG tablet Take 1 tablet (25 mg total) by mouth 2 (two) times daily. Patient taking differently: Take 12.5 mg by mouth 2 (two) times daily.  06/24/17   Ardelle Balls, PA-C  OVER THE COUNTER MEDICATION daily. Neuriva    [provider]  oxyCODONE (OXY IR/ROXICODONE) 5 MG immediate release tablet Take 5 mg by mouth every 4-6 hours PRN severe pain. 06/24/17   Ardelle Balls, PA-C  pantoprazole (PROTONIX) 40 MG tablet Take 40 mg by mouth daily.    [provider]  rosuvastatin (CRESTOR) 40 MG tablet TAKE 1 TABLET BY MOUTH ONCE DAILY AT  [facility] 08/16/17   Kerin Perna, MD    Allergies Codeine and Lisinopril  Family History  Problem Relation Age of Onset  . Heart attack Brother   . Heart disease Brother     Social History Social History   Tobacco Use  . Smoking status: Former Smoker    Packs/day: 1.50    Years: 43.00    Pack years: 64.50    Types: Cigars, Cigarettes    Quit date: 07/30/2013    Years since quitting: 6.6  . Smokeless tobacco: Never Used  Vaping Use  . Vaping Use: Never used  Substance Use Topics  . Alcohol use: No  . Drug use: No    Review of Systems Constitutional: Positive for fever/chills Eyes: No visual changes. ENT: No sore throat. Cardiovascular: Denies chest pain. Respiratory: Positive for cough and shortness of  breath. Gastrointestinal: No abdominal pain.  No nausea, no vomiting.  No diarrhea.  No constipation. Genitourinary: Negative for dysuria. Musculoskeletal: Negative for neck pain.  Negative for back pain. Integumentary: Negative for rash. Neurological: Negative for headaches, focal weakness or numbness.   ____________________________________________   PHYSICAL EXAM:  VITAL SIGNS: ED Triage Vitals [03/13/20 1944]  Enc Vitals Group     BP (!) 116/91     Pulse Rate 72     Resp 20     Temp 99.8 F (37.7 C)     Temp Source Oral     SpO2 90 %     Weight 83.9 kg (185 lb)     Height 1.854 m (6\' 1" )     Head Circumference      Peak Flow      Pain Score      Pain Loc      Pain Edu?      Excl. in GC?  Constitutional: Alert and oriented.  Eyes: Conjunctivae are normal.  Head: Atraumatic. Mouth/Throat: Patient is wearing a mask. Neck: No stridor.  No meningeal signs.   Cardiovascular: Normal rate, regular rhythm. Good peripheral circulation. Grossly normal heart sounds. Respiratory: Tachypnea, diffuse bilateral rhonchi. Gastrointestinal: Soft and nontender. No distention.  Musculoskeletal: No lower extremity tenderness nor edema. No gross deformities of extremities. Neurologic:  Normal speech and language. No gross focal neurologic deficits are appreciated.  Skin:  Skin is warm, dry and intact. Psychiatric: Mood and affect are normal. Speech and behavior are normal.  ____________________________________________   LABS (all labs ordered are listed, but only abnormal results are displayed)  Labs Reviewed  SARS CORONAVIRUS 2 BY RT PCR (HOSPITAL ORDER, PERFORMED IN [facility]) - Abnormal; Notable for the following components:      Result Value   SARS Coronavirus 2 POSITIVE (*)    All other components within normal limits  CBC WITH DIFFERENTIAL/PLATELET - Abnormal; Notable for the following components:   WBC 2.6 (*)    Platelets 128 (*)    Neutro Abs 1.6 (*)     All other components within normal limits  COMPREHENSIVE METABOLIC PANEL - Abnormal; Notable for the following components:   Glucose, Bld 113 (*)    AST 58 (*)    ALT 61 (*)    All other components within normal limits  FIBRIN DERIVATIVES D-DIMER ([facility] ONLY) - Abnormal; Notable for the following components:   Fibrin derivatives D-dimer ([facility]) 871.01 (*)    All other components within normal limits  TROPONIN I (HIGH SENSITIVITY)  TROPONIN I (HIGH SENSITIVITY)   ____________________________________________  EKG  ED ECG REPORT I, [place] Weiand, the attending physician, personally viewed and interpreted this ECG.   Date: 03/13/2020  EKG Time: 7:54 PM  Rate: 73  Rhythm: Normal sinus rhythm  Axis: Normal   Interval: Normal  ST&T Change: None  ____________________________________________  RADIOLOGY I, [place] Iain [place], personally viewed and evaluated these images (plain radiographs) as part of my medical decision making, as well as reviewing the written report by the radiologist.  ED MD interpretation: Mild bilateral hazy airspace opacities on chest x-ray.  Moderate bilateral groundglass opacities consistent with Covid pneumonia on CT chest  Official radiology report(s): DG Chest 2 View  Result Date: 03/13/2020 CLINICAL DATA:  COVID exposure EXAM: CHEST - 2 VIEW COMPARISON:  September 28, 2017 FINDINGS: The heart size and mediastinal contours are within normal limits. Mildly hazy airspace opacity seen at the periphery of the right mid lung left lower lung. No large airspace consolidation or pleural effusion. Overlying median sternotomy wires are present. IMPRESSION: Mild bilateral hazy airspace opacities which could be due to atelectasis and/or early infectious etiology. Electronically Signed   By: Jonna ClarkBindu  Avutu M.D.   On: 03/13/2020 20:20   CT Angio Chest PE W and/or Wo Contrast  Result Date: 03/14/2020 CLINICAL DATA:  COVID positive positive D-dimer fever short of breath EXAM: CT  ANGIOGRAPHY CHEST WITH CONTRAST TECHNIQUE: Multidetector CT imaging of the chest was performed using the standard protocol during bolus administration of intravenous contrast. Multiplanar CT image reconstructions and MIPs were obtained to evaluate the vascular anatomy. CONTRAST:  75mL OMNIPAQUE IOHEXOL 350 MG/ML SOLN COMPARISON:  Chest x-ray 03/13/2020 FINDINGS: Cardiovascular: Satisfactory opacification of the pulmonary arteries to the segmental level. No evidence of pulmonary embolism. Nonaneurysmal aorta. Mild aortic atherosclerosis. Status post CABG. Coronary vascular calcification. No significant pericardial effusion. Mediastinum/Nodes: Midline trachea. No thyroid mass. No suspicious adenopathy. Esophagus  within normal limits Lungs/Pleura: Mild emphysematous disease. Moderate bilateral ground-glass densities and patchy consolidations. No pleural effusion or pneumothorax Upper Abdomen: No acute abnormality. Musculoskeletal: No chest wall abnormality. No acute or significant osseous findings. Review of the MIP images confirms the above findings. IMPRESSION: 1. Negative for acute pulmonary embolus. 2. Moderate bilateral ground-glass densities and patchy consolidations, felt consistent with COVID pneumonia. 3. Emphysema and aortic atherosclerosis. Aortic Atherosclerosis (ICD10-I70.0) and Emphysema (ICD10-J43.9). Electronically Signed   By: Jasmine Pang M.D.   On: 03/14/2020 03:11    _______________________________________ Procedures   ____________________________________________   INITIAL IMPRESSION / MDM / ASSESSMENT AND PLAN / ED COURSE  As part of my medical decision making, I reviewed the following data within the electronic MEDICAL RECORD NUMBER   64 year old male presented with above-stated history and physical exam concerning for Covid pneumonia which is confirmed with PCR chest x-ray and CT scan.  Patient given Decadron 10 mg IV, remdesivir ordered.  Ceftriaxone and azithromycin also ordered.   Patient currently on 4 L oxygen nasal cannula satting 94%.  ____________________________________________  FINAL CLINICAL IMPRESSION(S) / ED DIAGNOSES  Final diagnoses:  None     MEDICATIONS GIVEN DURING THIS VISIT:  Medications  ondansetron (ZOFRAN-ODT) disintegrating tablet 4 mg (has no administration in time range)  dexamethasone (DECADRON) injection 10 mg (has no administration in time range)  cefTRIAXone (ROCEPHIN) 1 g in sodium chloride 0.9 % 100 mL IVPB (has no administration in time range)  azithromycin (ZITHROMAX) 500 mg in sodium chloride 0.9 % 250 mL IVPB (has no administration in time range)  sodium chloride 0.9 % bolus 1,000 mL (has no administration in time range)  remdesivir 200 mg in sodium chloride 0.9% 250 mL IVPB (has no administration in time range)    Followed by  remdesivir 100 mg in sodium chloride 0.9 % 100 mL IVPB (has no administration in time range)  iohexol (OMNIPAQUE) 350 MG/ML injection 75 mL (75 mLs Intravenous Contrast Given 03/14/20 0254)     ED Discharge Orders    None      *Please note:  Mansfield Dann was evaluated in Emergency Department on 03/14/2020 for the symptoms described in the history of present illness. He was evaluated in the context of the global COVID-19 pandemic, which necessitated consideration that the patient might be at risk for infection with the SARS-CoV-2 virus that causes COVID-19. Institutional protocols and algorithms that pertain to the evaluation of patients at risk for COVID-19 are in a state of rapid change based on information released by regulatory bodies including the CDC and federal and state organizations. These policies and algorithms were followed during the patient's care in the ED.  Some ED evaluations and interventions may be delayed as a result of limited staffing during and after the pandemic.*  Note:  This document was prepared using Dragon voice recognition software and may include unintentional dictation  errors.   Darci Current, MD 03/14/20 [id number]    Darci Current, MD 03/14/20 [phone]

## 2020-03-14 NOTE — ED Notes (Signed)
Waiting on remdisivir from pharmacy.  

## 2020-03-14 NOTE — Progress Notes (Signed)
On assessment, patient requested to put his wallet in the security box. Went into room and verified with Curtis Sherman how much lose bills patient had in wallet. He had 4 - 20 bills and 3 - 5 bills. Secured bag in security bag and notified security about coming to get bag. Security arrived and took belongings. He is resting in bed with call bell in reach.

## 2020-03-14 NOTE — H&P (Addendum)
History and Physical    Curtis Sherman FTD:[id number] DOB: 1955/07/26 DOA: 03/14/2020  PCP: Marina Goodell, MD   Patient coming from: Home  I have personally briefly reviewed patient's old medical records in [facility]  Chief Complaint: Weakness  HPI: Curtis Sherman is a 64 y.o. male with medical history significant for diabetes mellitus, hypertension coronary artery disease, peripheral vascular disease and dyslipidemia who presents to the ER for evaluation of weakness, nausea, vomiting and diarrhea which she has had for about 10 days.  Patient states he started having symptoms on Monday, September 6 and tested positive for the COVID-19 virus on Thursday, September 9.  He has had a nonproductive cough and worsening shortness of breath and stated that his pulse oximetry at home was in the 70s.  On 3 L of oxygen his pulse oximetry improved to 92%. He denies having any chest pain, no urinary symptoms, no headache, no dizziness or lightheadedness. Patient is unvaccinated Labs show sodium 135, potassium 3.6, chloride 98, bicarb 26, BUN 17, creatinine 0.81, alkaline phosphatase 73, AST 58, ALT 61, LDH 267, ferritin 495, white count 2.6, hemoglobin 15.7, hematocrit 46.2, RDW 12.6, platelet count 128, fibrin derivatives 871 CT angiogram of the chest is negative for pulmonary embolism but shows moderate bilateral diffuse and patchy consolidations consistent with Covid pneumonia. Emphysema. Chest x-ray reviewed by me shows mild bilateral hazy airspace opacities Twelve-lead EKG reviewed by me shows normal sinus rhythm, ST and T wave changes in the inferior wall consistent with ischemia    ED Course: Patient is a 64 year old Caucasian male with a past medical history significant for diabetes mellitus, hypertension, coronary artery disease and COPD who presents to the ER for evaluation of weakness, nausea, vomiting, nonproductive cough and worsening shortness of breath.  He had a room air  pulse oximetry of 70% and is improved following oxygen supplementation at 3 L to 93%.  Chest x-ray is consistent with Covid pneumonia.  Patient received Decadron and remdesivir in the ER Patient will be admitted to the hospital for further evaluation.  Review of Systems: As per HPI otherwise 10 point review of systems negative.    Past Medical History:  Diagnosis Date  . Arthritis    right foot  . Diabetes mellitus without complication (HCC)   . GERD (gastroesophageal reflux disease)   . High cholesterol   . Peripheral vascular disease (HCC)   . S/P insertion of iliac artery stent 08/02/2018   bilateral  . S/P right coronary artery (RCA) stent placement 04/19/2018   x2    . STEMI (ST elevation myocardial infarction) (HCC) 06/17/2017  . Wears dentures    full upper, partial lower    Past Surgical History:  Procedure Laterality Date  . APPENDECTOMY    . BACK SURGERY     lumbar  x2  . CARDIAC CATHETERIZATION  04/19/2018   2 stents  . CATARACT EXTRACTION W/PHACO Right 06/06/2019   Procedure: CATARACT EXTRACTION PHACO AND INTRAOCULAR LENS PLACEMENT (IOC) RIGHT DIABETIC, 1.06, 00:18.3;  Surgeon: Nevada Crane, MD;  Location: [facility];  Service: Ophthalmology;  Laterality: Right;  Diabetic - oral meds  . CATARACT EXTRACTION W/PHACO Left 06/27/2019   Procedure: CATARACT EXTRACTION PHACO AND INTRAOCULAR LENS PLACEMENT (IOC) LEFT DIABETIC 1.84,  00:23.0;  Surgeon: Nevada Crane, MD;  Location: [facility];  Service: Ophthalmology;  Laterality: Left;  Diabetic - oral meds  . CORONARY ARTERY BYPASS GRAFT N/A 06/19/2017   Procedure: CORONARY ARTERY BYPASS GRAFTING (  CABG) x 3 WITH RIGHT ENDOVEIN HARVESTING;  Surgeon: Delight OvensGerhardt, Edward B, MD;  [facility];  Service: Open Heart Surgery;  Laterality: N/A;  . CORONARY/GRAFT ACUTE MI REVASCULARIZATION N/A 06/17/2017   Procedure: Coronary Angiography;  Surgeon: Marcina MillardParaschos, Alexander, MD;  Location: [facility] INVAS[facility];   Service: Cardiovascular;  Laterality: N/A;  . ESOPHAGOGASTRODUODENOSCOPY (EGD) WITH PROPOFOL N/A 12/25/2014   Procedure: ESOPHAGOGASTRODUODENOSCOPY (EGD) WITH PROPOFOL;  Surgeon: Scot Junobert T Elliott, MD;  [facility];  Service: Endoscopy;  Laterality: N/A;  . FRACTURE SURGERY    . HERNIA REPAIR    . LEFT HEART CATH AND CORONARY ANGIOGRAPHY N/A 06/17/2017   Procedure: LEFT HEART CATH;  Surgeon: Marcina MillardParaschos, Alexander, MD;  Location: [facility] INVASIVE CV LAB;  Service: Cardiovascular;  Laterality: N/A;  . NISSEN FUNDOPLICATION       reports that he quit smoking about 6 years ago. His smoking use included cigars and cigarettes. He has a 64.50 pack-year smoking history. He has never used smokeless tobacco. He reports that he does not drink alcohol and does not use drugs.  Allergies  Allergen Reactions  . Codeine Nausea Only  . Lisinopril Cough    Family History  Problem Relation Age of Onset  . Heart attack Brother   . Heart disease Brother      Prior to Admission medications   Medication Sig Start Date End Date Taking? Authorizing Provider  acetaminophen (TYLENOL) 325 MG tablet Take 2 tablets (650 mg total) by mouth every 6 (six) hours as needed for mild pain. 06/24/17  Yes Doree FudgeZimmerman, Donielle M, PA-C  amLODipine (NORVASC) 10 MG tablet Take 10 mg by mouth daily. 01/30/20  Yes [provider]  aspirin EC 81 MG EC tablet Take 1 tablet (81 mg total) by mouth daily. 06/24/17  Yes Doree FudgeZimmerman, Donielle M, PA-C  ezetimibe (ZETIA) 10 MG tablet Take 10 mg by mouth daily.   Yes [provider]  Lactobacillus Rhamnosus, GG, (CULTURELLE PO) Take by mouth daily.   Yes [provider]  losartan (COZAAR) 50 MG tablet Take 75 mg by mouth daily.   Yes [provider]  metFORMIN (GLUCOPHAGE) 500 MG tablet Take 500 mg by mouth 2 (two) times daily with a meal.    Yes [provider]  metoprolol tartrate (LOPRESSOR) 25 MG tablet Take 1 tablet (25 mg total) by  mouth 2 (two) times daily. Patient taking differently: Take 12.5 mg by mouth 2 (two) times daily.  06/24/17  Yes Doree FudgeZimmerman, Donielle M, PA-C  pantoprazole (PROTONIX) 40 MG tablet Take 40 mg by mouth daily.   Yes [provider]  rosuvastatin (CRESTOR) 40 MG tablet TAKE 1 TABLET BY MOUTH ONCE DAILY AT  6PM 08/16/17  Yes Donata ClayVan Trigt, Theron AristaPeter, MD  cephALEXin (KEFLEX) 500 MG capsule Take 500 mg by mouth 3 (three) times daily. Patient not taking: Reported on 03/14/2020    [provider]  clopidogrel (PLAVIX) 75 MG tablet TAKE 1 TABLET BY MOUTH ONCE DAILY Patient not taking: Reported on 03/14/2020 08/16/17   Kerin PernaVan Trigt, Peter, MD  diphenhydrAMINE (BENADRYL) 25 mg capsule Take 25 mg by mouth at bedtime as needed for itching. Patient not taking: Reported on 03/14/2020    [provider]  OVER THE COUNTER MEDICATION daily. Neuriva Patient not taking: Reported on 03/14/2020    [provider]  oxyCODONE (OXY IR/ROXICODONE) 5 MG immediate release tablet Take 5 mg by mouth every 4-6 hours PRN severe pain. Patient not taking: Reported on 03/14/2020 06/24/17   Joycelyn ManZimmerman,  Lelon Huh, PA-C    Physical Exam: Vitals:   03/14/20 0430 03/14/20 0500 03/14/20 0530 03/14/20 0600  BP:    133/66  Pulse: 66 62 62 73  Resp: 20 20 (!) 21 17  Temp:    98.1 F (36.7 C)  TempSrc:    Oral  SpO2: 96% 95% 94% 94%  Weight:      Height:         Vitals:   03/14/20 0430 03/14/20 0500 03/14/20 0530 03/14/20 0600  BP:    133/66  Pulse: 66 62 62 73  Resp: 20 20 (!) 21 17  Temp:    98.1 F (36.7 C)  TempSrc:    Oral  SpO2: 96% 95% 94% 94%  Weight:      Height:        Constitutional: NAD, alert and oriented x 3.  Acutely ill-appearing Eyes: PERRL, lids and conjunctivae normal ENMT: Mucous membranes are moist.  Neck: normal, supple, no masses, no thyromegaly Respiratory: Bilateral air entry, no wheezing, faint crackles. Normal respiratory effort. No accessory muscle use.    Cardiovascular: Regular rate and rhythm, no murmurs / rubs / gallops. No extremity edema. 2+ pedal pulses. No carotid bruits.  Abdomen: no tenderness, no masses palpated. No hepatosplenomegaly. Bowel sounds positive. Central adiposity Musculoskeletal: no clubbing / cyanosis. No joint deformity upper and lower extremities.  Skin: no rashes, lesions, ulcers.  Neurologic: Generalized weakness Psychiatric: Normal mood and affect.   Labs on Admission: I have personally reviewed following labs and imaging studies  CBC: Recent Labs  Lab 03/13/20 1954  WBC 2.6*  NEUTROABS 1.6*  HGB 15.7  HCT 46.2  MCV 88.5  PLT 128*   Basic Metabolic Panel: Recent Labs  Lab 03/13/20 1954  NA 135  K 3.6  CL 98  CO2 26  GLUCOSE 113*  BUN 17  CREATININE 0.81  CALCIUM 9.2   GFR: Estimated Creatinine Clearance: 102.8 mL/min (by C-G formula based on SCr of 0.81 mg/dL). Liver Function Tests: Recent Labs  Lab 03/13/20 1954  AST 58*  ALT 61*  ALKPHOS 73  BILITOT 0.9  PROT 7.0  ALBUMIN 3.6   No results for input(s): LIPASE, AMYLASE in the last 168 hours. No results for input(s): AMMONIA in the last 168 hours. Coagulation Profile: No results for input(s): INR, PROTIME in the last 168 hours. Cardiac Enzymes: No results for input(s): CKTOTAL, CKMB, CKMBINDEX, TROPONINI in the last 168 hours. BNP (last 3 results) No results for input(s): PROBNP in the last 8760 hours. HbA1C: No results for input(s): HGBA1C in the last 72 hours. CBG: Recent Labs  Lab 03/14/20 0837  GLUCAP 165*   Lipid Profile: No results for input(s): CHOL, HDL, LDLCALC, TRIG, CHOLHDL, LDLDIRECT in the last 72 hours. Thyroid Function Tests: No results for input(s): TSH, T4TOTAL, FREET4, T3FREE, THYROIDAB in the last 72 hours. Anemia Panel: Recent Labs    03/14/20 0708  FERRITIN 495*   Urine analysis:    Component Value Date/Time   COLORURINE YELLOW 06/17/2017 2354   APPEARANCEUR CLEAR 06/17/2017 2354   LABSPEC  1.033 (H) 06/17/2017 2354   PHURINE 6.0 06/17/2017 2354   GLUCOSEU NEGATIVE 06/17/2017 2354   HGBUR MODERATE (A) 06/17/2017 2354   BILIRUBINUR NEGATIVE 06/17/2017 2354   KETONESUR NEGATIVE 06/17/2017 2354   PROTEINUR NEGATIVE 06/17/2017 2354   NITRITE NEGATIVE 06/17/2017 2354   LEUKOCYTESUR NEGATIVE 06/17/2017 2354    Radiological Exams on Admission: DG Chest 2 View  Result Date: 03/13/2020 CLINICAL DATA:  COVID  exposure EXAM: CHEST - 2 VIEW COMPARISON:  September 28, 2017 FINDINGS: The heart size and mediastinal contours are within normal limits. Mildly hazy airspace opacity seen at the periphery of the right mid lung left lower lung. No large airspace consolidation or pleural effusion. Overlying median sternotomy wires are present. IMPRESSION: Mild bilateral hazy airspace opacities which could be due to atelectasis and/or early infectious etiology. Electronically Signed   By: Jonna Clark M.D.   On: 03/13/2020 20:20   CT Angio Chest PE W and/or Wo Contrast  Result Date: 03/14/2020 CLINICAL DATA:  COVID positive positive D-dimer fever short of breath EXAM: CT ANGIOGRAPHY CHEST WITH CONTRAST TECHNIQUE: Multidetector CT imaging of the chest was performed using the standard protocol during bolus administration of intravenous contrast. Multiplanar CT image reconstructions and MIPs were obtained to evaluate the vascular anatomy. CONTRAST:  14mL OMNIPAQUE IOHEXOL 350 MG/ML SOLN COMPARISON:  Chest x-ray 03/13/2020 FINDINGS: Cardiovascular: Satisfactory opacification of the pulmonary arteries to the segmental level. No evidence of pulmonary embolism. Nonaneurysmal aorta. Mild aortic atherosclerosis. Status post CABG. Coronary vascular calcification. No significant pericardial effusion. Mediastinum/Nodes: Midline trachea. No thyroid mass. No suspicious adenopathy. Esophagus within normal limits Lungs/Pleura: Mild emphysematous disease. Moderate bilateral ground-glass densities and patchy consolidations. No  pleural effusion or pneumothorax Upper Abdomen: No acute abnormality. Musculoskeletal: No chest wall abnormality. No acute or significant osseous findings. Review of the MIP images confirms the above findings. IMPRESSION: 1. Negative for acute pulmonary embolus. 2. Moderate bilateral ground-glass densities and patchy consolidations, felt consistent with COVID pneumonia. 3. Emphysema and aortic atherosclerosis. Aortic Atherosclerosis (ICD10-I70.0) and Emphysema (ICD10-J43.9). Electronically Signed   By: Jasmine Pang M.D.   On: 03/14/2020 03:11    EKG: Independently reviewed.  Sinus rhythm ST-T wave changes in the inferior leads consistent with ischemia  Assessment/Plan Principal Problem:   Pneumonia due to COVID-19 virus Active Problems:   GERD (gastroesophageal reflux disease)   HTN (hypertension)   Diet-controlled type 2 diabetes mellitus (HCC)   Acute hypoxemic respiratory failure due to COVID-19 (HCC)   Transaminitis   Thrombocytopenia (HCC)   Pneumonia due to COVID-19 virus with acute respiratory failure Patient presents to the emergency room for evaluation of multiple symptoms which include weakness, nausea, vomiting, diarrhea, nonproductive cough as well as worsening shortness of breath. He tested positive for the COVID-19 virus on 03/08/20 He is unvaccinated Chest x-ray is consistent with Covid pneumonia and patient was hypoxic in the field with room air pulse oximetry in the 70s, this improved following oxygen supplementation at 3 L to 92% Continue remdesivir per protocol Continue Solu-Medrol Patient was also started on baricitinib Continue oxygen supplementation to maintain pulse oximetry greater than 92%   Diabetes mellitus Continue consistent carbohydrate diet Place patient on long-acting insulin with Levemir and sliding scale coverage   Hypertension Blood pressure stable Continue Cozaar, metoprolol and amlodipine    History of coronary artery  disease Stable Continue aspirin, metoprolol and Zetia   GERD Continue PPI   Transaminitis Most likely related to COVID-19 viral infection We will monitor closely during this hospitalization   Thrombocytopenia Stable Monitor closely during this hospitalization   DVT prophylaxis: Lovenox Code Status: Full code Family Communication: Greater than 50% of time was spent discussing plan of care with patient at the bedside.  He verbalizes understanding and agrees with the plan.  All questions and concerns have been addressed Disposition Plan: Back to previous home environment Consults called: None    Ashon Rosenberg MD [facility]  03/14/2020, 8:38 AM

## 2020-03-14 NOTE — ED Notes (Signed)
Attempt to call report, no answer on unit

## 2020-03-14 NOTE — Progress Notes (Signed)
Remdesivir - Pharmacy Brief Note     A/P:  Remdesivir 200 mg IVPB once followed by 100 mg IVPB daily x 4 days.   Valrie Hart, PharmD Clinical Pharmacist   03/14/2020 3:51 AM

## 2020-03-15 LAB — COMPREHENSIVE METABOLIC PANEL
ALT: 49 U/L — ABNORMAL HIGH (ref 0–44)
AST: 39 U/L (ref 15–41)
Albumin: 2.7 g/dL — ABNORMAL LOW (ref 3.5–5.0)
Alkaline Phosphatase: 52 U/L (ref 38–126)
Anion gap: 11 (ref 5–15)
BUN: 20 mg/dL (ref 8–23)
CO2: 25 mmol/L (ref 22–32)
Calcium: 9.5 mg/dL (ref 8.9–10.3)
Chloride: 105 mmol/L (ref 98–111)
Creatinine, Ser: 0.58 mg/dL — ABNORMAL LOW (ref 0.61–1.24)
GFR calc Af Amer: >60 mL/min (ref >60)
GFR calc non Af Amer: >60 mL/min (ref >60)
Glucose, Bld: 153 mg/dL — ABNORMAL HIGH (ref 70–99)
Potassium: 4.2 mmol/L (ref 3.5–5.1)
Sodium: 141 mmol/L (ref 135–145)
Total Bilirubin: 0.6 mg/dL (ref 0.3–1.2)
Total Protein: 5.6 g/dL — ABNORMAL LOW (ref 6.5–8.1)

## 2020-03-15 LAB — CBC WITH DIFFERENTIAL/PLATELET
Abs Immature Granulocytes: 0.03 10*3/uL (ref 0.00–0.07)
Basophils Absolute: 0 10*3/uL (ref 0.0–0.1)
Basophils Relative: 0 %
Eosinophils Absolute: 0 10*3/uL (ref 0.0–0.5)
Eosinophils Relative: 0 %
HCT: 38.3 % — ABNORMAL LOW (ref 39.0–52.0)
Hemoglobin: 13.1 g/dL (ref 13.0–17.0)
Immature Granulocytes: 1 %
Lymphocytes Relative: 13 %
Lymphs Abs: 0.6 10*3/uL — ABNORMAL LOW (ref 0.7–4.0)
MCH: 30.2 pg (ref 26.0–34.0)
MCHC: 34.2 g/dL (ref 30.0–36.0)
MCV: 88.2 fL (ref 80.0–100.0)
Monocytes Absolute: 0.3 10*3/uL (ref 0.1–1.0)
Monocytes Relative: 6 %
Neutro Abs: 3.7 10*3/uL (ref 1.7–7.7)
Neutrophils Relative %: 80 %
Platelets: 144 10*3/uL — ABNORMAL LOW (ref 150–400)
RBC: 4.34 MIL/uL (ref 4.22–5.81)
RDW: 12.6 % (ref 11.5–15.5)
WBC: 4.5 10*3/uL (ref 4.0–10.5)
nRBC: 0 % (ref 0.0–0.2)

## 2020-03-15 LAB — GLUCOSE, CAPILLARY
Glucose-Capillary: 138 mg/dL — ABNORMAL HIGH (ref 70–99)
Glucose-Capillary: 192 mg/dL — ABNORMAL HIGH (ref 70–99)
Glucose-Capillary: 176 mg/dL — ABNORMAL HIGH (ref 70–99)
Glucose-Capillary: 166 mg/dL — ABNORMAL HIGH (ref 70–99)

## 2020-03-15 LAB — C-REACTIVE PROTEIN: CRP: 4.2 mg/dL — ABNORMAL HIGH (ref <1.0)

## 2020-03-15 LAB — FIBRIN DERIVATIVES D-DIMER (ARMC ONLY): Fibrin derivatives D-dimer (ARMC): 716.14 ng/mL (FEU) — ABNORMAL HIGH (ref 0.00–499.00)

## 2020-03-15 MED ORDER — ORAL CARE MOUTH RINSE
15.0000 mL | Freq: Two times a day (BID) | OROMUCOSAL | Status: DC
  Administered 2020-03-15 – 2020-03-17 (×6): 15 mL via OROMUCOSAL

## 2020-03-15 NOTE — Progress Notes (Signed)
Patient resting in bed. Began the day on 4L [place]. He was sating 95-99%. Began to titrate down O2 level. Currently he is 2L [place]. And o2 sats are maintaining at 96%. Will attempt to remove patient from oxygen today. He is requesting if we could allow him to shower. Bed in low position and call bell in reach. Will notify DR of patient wants.

## 2020-03-15 NOTE — Progress Notes (Addendum)
PROGRESS NOTE    Curtis Sherman  ZOX:[id number] DOB: 1956-05-02 DOA: 03/14/2020 PCP: Marina GoodellFeldpausch, Dale E, MD   Brief Narrative: Taken from H&P Curtis Sherman is a [age] y.o. male with medical history significant for diabetes mellitus, hypertension coronary artery disease, peripheral vascular disease and dyslipidemia who presents to the ER for evaluation of weakness, cough, shortness of breath, nausea, vomiting and diarrhea which she has had for about 10 days.  Patient states he started having symptoms on Monday, September 6 and tested positive for the COVID-19 virus on Thursday, September 9.  Patient is unvaccinated. CTA negative for PE but positive for bilateral infiltrates consistent with COVID-19 pneumonia.  Hypoxic requiring 4 L of oxygen.  Started on remdesivir, steroid and baricitinib.  Subjective: Still having quite a bit of cough and weakness. No CP/N/V/D.Saturating low to mid 90's on 4L.  Assessment & Plan:   Principal Problem:   Pneumonia due to COVID-19 virus Active Problems:   GERD (gastroesophageal reflux disease)   HTN (hypertension)   Diet-controlled type 2 diabetes mellitus (HCC)   Acute hypoxemic respiratory failure due to COVID-19 (HCC)   Transaminitis   Thrombocytopenia (HCC)  Acute hypoxic respiratory failure secondary to COVID-19 pneumonia. Chest x-ray with bilateral opacities.  Elevated inflammatory markers which now started trending down.  He was started on remdesivir, steroid and baricitinib -Continue remdesivir and steroid-day 2 -Continue baricitinib- day2 -Continue with supportive care. -Continue with supplemental oxygen-try weaning as tolerated. -Continue to monitor inflammatory markers.  Diabetes mellitus.  CBG mildly elevated.  Patient is on steroid. -Continue Levemir and sliding scale.  Hypertension.  Blood pressure within goal. -Continue home dose of losartan and amlodipine. -Holding metoprolol due to mild bradycardia-can be restarted if  needed.  History of CAD.  No chest pain. -Continue home dose of aspirin, Crestor and Zetia. -Holding metoprolol due to bradycardia.  Thrombocytopenia.  Platelets seems improving.  144 today -Continue to monitor.  Transaminitis.  Most likely secondary to COVID-19 infection. Improving. -Continue to monitor.  GERD. -Continue PPI.  Objective: Vitals:   03/14/20 1953 03/14/20 2328 03/15/20 0351 03/15/20 0724  BP: (!) 116/52 (!) 110/52 (!) 103/45 (!) 97/38  Pulse: (!) 52 (!) 56 (!) 46 (!) 54  Resp: 19 20 18 18   Temp: 97.6 F (36.4 C) 98 F (36.7 C) 98 F (36.7 C) 98 F (36.7 C)  TempSrc: Oral Oral Oral Oral  SpO2: 90% 92% 96% 92%  Weight:      Height:        Intake/Output Summary (Last 24 hours) at 03/15/2020 0833 Last data filed at 03/15/2020 0600 Gross per 24 hour  Intake 1906.96 ml  Output 1950 ml  Net -43.04 ml   Filed Weights   03/13/20 1944  Weight: 83.9 kg    Examination:  General exam: Appears calm and comfortable  Respiratory system: Clear to auscultation. Respiratory effort normal. Cardiovascular system: S1 & S2 heard, RRR. No JVD, murmurs, Gastrointestinal system: Soft, nontender, nondistended, bowel sounds positive. Central nervous system: Alert and oriented. No focal neurological deficits. Extremities: No edema, no cyanosis, pulses intact and symmetrical. Skin: No rashes, lesions or ulcers Psychiatry: Judgement and insight appear normal. Mood & affect appropriate.    DVT prophylaxis: Lovenox Code Status: Full Family Communication: Wife was updated on phone. Disposition Plan:  Status is: Inpatient  Remains inpatient appropriate because:Inpatient level of care appropriate due to severity of illness   Dispo: The patient is from: Home  Anticipated d/c is to: Home              Anticipated d/c date is: 3 days              Patient currently is not medically stable to d/c.   Consultants:   None  Procedures:  Antimicrobials:   Data  Reviewed: I have personally reviewed following labs and imaging studies  CBC: Recent Labs  Lab 03/13/20 1954 03/15/20 0551  WBC 2.6* 4.5  NEUTROABS 1.6* 3.7  HGB 15.7 13.1  HCT 46.2 38.3*  MCV 88.5 88.2  PLT 128* 144*   Basic Metabolic Panel: Recent Labs  Lab 03/13/20 1954 03/15/20 0551  NA 135 141  K 3.6 4.2  CL 98 105  CO2 26 25  GLUCOSE 113* 153*  BUN 17 20  CREATININE 0.81 0.58*  CALCIUM 9.2 9.5   GFR: Estimated Creatinine Clearance: 104 mL/min (A) (by C-G formula based on SCr of 0.58 mg/dL (L)). Liver Function Tests: Recent Labs  Lab 03/13/20 1954 03/15/20 0551  AST 58* 39  ALT 61* 49*  ALKPHOS 73 52  BILITOT 0.9 0.6  PROT 7.0 5.6*  ALBUMIN 3.6 2.7*   No results for input(s): LIPASE, AMYLASE in the last 168 hours. No results for input(s): AMMONIA in the last 168 hours. Coagulation Profile: No results for input(s): INR, PROTIME in the last 168 hours. Cardiac Enzymes: No results for input(s): CKTOTAL, CKMB, CKMBINDEX, TROPONINI in the last 168 hours. BNP (last 3 results) No results for input(s): PROBNP in the last 8760 hours. HbA1C: No results for input(s): HGBA1C in the last 72 hours. CBG: Recent Labs  Lab 03/14/20 0837 03/14/20 1158 03/14/20 1634 03/14/20 2115 03/15/20 0739  GLUCAP 165* 181* 171* 169* 138*   Lipid Profile: No results for input(s): CHOL, HDL, LDLCALC, TRIG, CHOLHDL, LDLDIRECT in the last 72 hours. Thyroid Function Tests: No results for input(s): TSH, T4TOTAL, FREET4, T3FREE, THYROIDAB in the last 72 hours. Anemia Panel: Recent Labs    03/14/20 0708  FERRITIN 495*   Sepsis Labs: Recent Labs  Lab 03/14/20 0708  PROCALCITON <0.10    Recent Results (from the past 240 hour(s))  SARS Coronavirus 2 by RT PCR (hospital order, performed in [facility] hospital lab) Nasopharyngeal Nasopharyngeal Swab     Status: Abnormal   Collection Time: 03/13/20  7:54 PM   Specimen: Nasopharyngeal Swab  Result Value Ref Range Status    SARS Coronavirus 2 POSITIVE (A) NEGATIVE Final    Comment: RESULT CALLED TO, READ BACK BY AND VERIFIED WITH: LISA THOMPSON 03/13/20 AT 2112 BY AR (NOTE) SARS-CoV-2 target nucleic acids are DETECTED  SARS-CoV-2 RNA is generally detectable in upper respiratory specimens  during the acute phase of infection.  Positive results are indicative  of the presence of the identified virus, but do not rule out bacterial infection or co-infection with other pathogens not detected by the test.  Clinical correlation with patient history and  other diagnostic information is necessary to determine patient infection status.  The expected result is negative.  Fact Sheet for Patients:   BoilerBrush.com.cy   Fact Sheet for Healthcare Providers:   https://pope.com/    This test is not yet approved or cleared by the [place] FDA and  has been authorized for detection and/or diagnosis of SARS-CoV-2 by FDA under an Emergency Use Authorization (EUA).  This EUA will remain in effect (meaning this t est can be used) for the duration of  the COVID-19 declaration under Section 564(b)(1)  of the Act, 21 U.S.C. section 360-bbb-3(b)(1), unless the authorization is terminated or revoked sooner.  Performed at [facility], [address], [place], [place] [address]      Radiology Studies: DG Chest 2 View  Result Date: 03/13/2020 CLINICAL DATA:  COVID exposure EXAM: CHEST - 2 VIEW COMPARISON:  September 28, 2017 FINDINGS: The heart size and mediastinal contours are within normal limits. Mildly hazy airspace opacity seen at the periphery of the right mid lung left lower lung. No large airspace consolidation or pleural effusion. Overlying median sternotomy wires are present. IMPRESSION: Mild bilateral hazy airspace opacities which could be due to atelectasis and/or early infectious etiology. Electronically Signed   By: Jonna Clark M.D.   On: 03/13/2020 20:20    CT Angio Chest PE W and/or Wo Contrast  Result Date: 03/14/2020 CLINICAL DATA:  COVID positive positive D-dimer fever short of breath EXAM: CT ANGIOGRAPHY CHEST WITH CONTRAST TECHNIQUE: Multidetector CT imaging of the chest was performed using the standard protocol during bolus administration of intravenous contrast. Multiplanar CT image reconstructions and MIPs were obtained to evaluate the vascular anatomy. CONTRAST:  44mL OMNIPAQUE IOHEXOL 350 MG/ML SOLN COMPARISON:  Chest x-ray 03/13/2020 FINDINGS: Cardiovascular: Satisfactory opacification of the pulmonary arteries to the segmental level. No evidence of pulmonary embolism. Nonaneurysmal aorta. Mild aortic atherosclerosis. Status post CABG. Coronary vascular calcification. No significant pericardial effusion. Mediastinum/Nodes: Midline trachea. No thyroid mass. No suspicious adenopathy. Esophagus within normal limits Lungs/Pleura: Mild emphysematous disease. Moderate bilateral ground-glass densities and patchy consolidations. No pleural effusion or pneumothorax Upper Abdomen: No acute abnormality. Musculoskeletal: No chest wall abnormality. No acute or significant osseous findings. Review of the MIP images confirms the above findings. IMPRESSION: 1. Negative for acute pulmonary embolus. 2. Moderate bilateral ground-glass densities and patchy consolidations, felt consistent with COVID pneumonia. 3. Emphysema and aortic atherosclerosis. Aortic Atherosclerosis (ICD10-I70.0) and Emphysema (ICD10-J43.9). Electronically Signed   By: Jasmine Pang M.D.   On: 03/14/2020 03:11    Scheduled Meds: . acidophilus  1 capsule Oral Daily  . amLODipine  10 mg Oral Daily  . vitamin C  500 mg Oral Daily  . aspirin EC  81 mg Oral Daily  . baricitinib  4 mg Oral Daily  . cholecalciferol  1,000 Units Oral Daily  . enoxaparin (LOVENOX) injection  40 mg Subcutaneous Q24H  . ezetimibe  10 mg Oral Daily  . famotidine  20 mg Oral BID  . guaiFENesin  600 mg Oral BID  .  insulin aspart  0-20 Units Subcutaneous TID PC & HS  . insulin detemir  0.15 Units/kg Subcutaneous BID  . losartan  75 mg Oral Daily  . mouth rinse  15 mL Mouth Rinse BID  . methylPREDNISolone (SOLU-MEDROL) injection  1 mg/kg Intravenous Q12H   Followed by  . [START ON 03/17/2020] predniSONE  50 mg Oral Daily  . metoprolol tartrate  12.5 mg Oral BID  . ondansetron  4 mg Oral Once  . pantoprazole  40 mg Oral Daily  . rosuvastatin  40 mg Oral q1800  . zinc sulfate  220 mg Oral Daily   Continuous Infusions: . sodium chloride 100 mL/hr at 03/15/20 0600  . remdesivir 200 mg in sodium chloride 0.9% 250 mL IVPB     Followed by  . remdesivir 100 mg in NS 100 mL       LOS: 1 day   Time spent: 35 minutes.  Arnetha Courser, MD [facility]  If 7PM-7AM, please contact night-coverage [email or web address]  03/15/2020,  8:33 AM   This record has been created using Conservation officer, historic buildings. Errors have been sought and corrected,but may not always be located. Such creation errors do not reflect on the standard of care.

## 2020-03-15 NOTE — Progress Notes (Signed)
Spoke with family and updated wife, Curtis Sherman about patient status. Notified her that patient has been weaned down to 2L [place]. Dr. Nelson Chimes stated it was ok for patient to have shower via secure chat. Patient tolerated shower well, no pain or distress. He is currently resting in bed with call bell in reach.

## 2020-03-16 LAB — CBC WITH DIFFERENTIAL/PLATELET
Abs Immature Granulocytes: 0.03 10*3/uL (ref 0.00–0.07)
Basophils Absolute: 0 10*3/uL (ref 0.0–0.1)
Basophils Relative: 0 %
Eosinophils Absolute: 0 10*3/uL (ref 0.0–0.5)
Eosinophils Relative: 0 %
HCT: 41.1 % (ref 39.0–52.0)
Hemoglobin: 13.7 g/dL (ref 13.0–17.0)
Immature Granulocytes: 0 %
Lymphocytes Relative: 10 %
Lymphs Abs: 0.8 10*3/uL (ref 0.7–4.0)
MCH: 29.6 pg (ref 26.0–34.0)
MCHC: 33.3 g/dL (ref 30.0–36.0)
MCV: 88.8 fL (ref 80.0–100.0)
Monocytes Absolute: 0.4 10*3/uL (ref 0.1–1.0)
Monocytes Relative: 5 %
Neutro Abs: 6.8 10*3/uL (ref 1.7–7.7)
Neutrophils Relative %: 85 %
Platelets: 199 10*3/uL (ref 150–400)
RBC: 4.63 MIL/uL (ref 4.22–5.81)
RDW: 12.8 % (ref 11.5–15.5)
WBC: 8 10*3/uL (ref 4.0–10.5)
nRBC: 0 % (ref 0.0–0.2)

## 2020-03-16 LAB — COMPREHENSIVE METABOLIC PANEL
ALT: 56 U/L — ABNORMAL HIGH (ref 0–44)
AST: 35 U/L (ref 15–41)
Albumin: 2.9 g/dL — ABNORMAL LOW (ref 3.5–5.0)
Alkaline Phosphatase: 55 U/L (ref 38–126)
Anion gap: 8 (ref 5–15)
BUN: 24 mg/dL — ABNORMAL HIGH (ref 8–23)
CO2: 27 mmol/L (ref 22–32)
Calcium: 9.5 mg/dL (ref 8.9–10.3)
Chloride: 104 mmol/L (ref 98–111)
Creatinine, Ser: 0.69 mg/dL (ref 0.61–1.24)
GFR calc Af Amer: >60 mL/min (ref >60)
GFR calc non Af Amer: >60 mL/min (ref >60)
Glucose, Bld: 159 mg/dL — ABNORMAL HIGH (ref 70–99)
Potassium: 4.3 mmol/L (ref 3.5–5.1)
Sodium: 139 mmol/L (ref 135–145)
Total Bilirubin: 0.6 mg/dL (ref 0.3–1.2)
Total Protein: 5.8 g/dL — ABNORMAL LOW (ref 6.5–8.1)

## 2020-03-16 LAB — GLUCOSE, CAPILLARY
Glucose-Capillary: 162 mg/dL — ABNORMAL HIGH (ref 70–99)
Glucose-Capillary: 186 mg/dL — ABNORMAL HIGH (ref 70–99)
Glucose-Capillary: 182 mg/dL — ABNORMAL HIGH (ref 70–99)
Glucose-Capillary: 158 mg/dL — ABNORMAL HIGH (ref 70–99)

## 2020-03-16 LAB — C-REACTIVE PROTEIN: CRP: 1.7 mg/dL — ABNORMAL HIGH (ref <1.0)

## 2020-03-16 LAB — FIBRIN DERIVATIVES D-DIMER (ARMC ONLY): Fibrin derivatives D-dimer (ARMC): 746.35 ng/mL (FEU) — ABNORMAL HIGH (ref 0.00–499.00)

## 2020-03-16 NOTE — Care Management Important Message (Signed)
Important Message  Patient Details  Name: Curtis Sherman MRN: [id number] Date of Birth: Aug 06, 1955   Medicare Important Message Given:  Yes  Reviewed with patient over room phone due to isolation status.  Declined copy of Medicare IM as one being sent to home address by Admitting already.    Johnell Comings 03/16/2020, 3:23 PM

## 2020-03-16 NOTE — Progress Notes (Signed)
Pt sats 89-92% on room air while at rest.  Pt goes up to 93-94% on 1LNC at rest

## 2020-03-16 NOTE — Progress Notes (Signed)
PROGRESS NOTE    Curtis Sherman  AYT:[id number] DOB: 1955-07-10 DOA: 03/14/2020 PCP: Marina Goodell, MD   Brief Narrative: Taken from H&P Curtis Sherman is a 64 y.o. male with medical history significant for diabetes mellitus, hypertension coronary artery disease, peripheral vascular disease and dyslipidemia who presents to the ER for evaluation of weakness, cough, shortness of breath, nausea, vomiting and diarrhea which she has had for about 10 days.  Patient states he started having symptoms on Monday, September 6 and tested positive for the COVID-19 virus on Thursday, September 9.  Patient is unvaccinated. CTA negative for PE but positive for bilateral infiltrates consistent with COVID-19 pneumonia.  Hypoxic requiring 4 L of oxygen.  Started on remdesivir, steroid and baricitinib.  Subjective: Pt reported doing better.  Has been up to the bathroom.  No N/V.     Assessment & Plan:   Principal Problem:   Pneumonia due to COVID-19 virus Active Problems:   GERD (gastroesophageal reflux disease)   HTN (hypertension)   Diet-controlled type 2 diabetes mellitus (HCC)   Acute hypoxemic respiratory failure due to COVID-19 (HCC)   Transaminitis   Thrombocytopenia (HCC)  Acute hypoxic respiratory failure secondary to COVID-19 pneumonia. Needed 4L initially.  Chest x-ray with bilateral opacities.  Elevated inflammatory markers which now started trending down.  He was started on remdesivir, steroid and baricitinib --O2 requirement down to 1L today PLAN: -Continue remdesivir  -continue solumedrol, taper to prednisone tomorrow -Continue baricitinib -Continue with supplemental oxygen-try weaning as tolerated. -Continue to monitor inflammatory markers.  Diabetes mellitus with hyperglycemia exacerbated by steroid use. --last A1c 7.4 in April 2021 --continue Levemir 13u BID --SSI TID  Hypertension.   Blood pressure within goal. -Continue home dose of losartan and  amlodipine. --hold home metop due to HR in 50's  History of CAD.  No chest pain. -Continue home dose of aspirin, Crestor and Zetia. -Holding metoprolol due to bradycardia.  Thrombocytopenia, resolved -Continue to monitor.  Transaminitis.  Most likely secondary to COVID-19 infection. Improving. -Continue to monitor.  GERD. --continue home PPI  --d/c pepcid since already on home PPI   Objective: Vitals:   03/15/20 2100 03/16/20 0021 03/16/20 0857 03/16/20 1253  BP: 122/62 113/61 113/61 130/62  Pulse: (!) 46 81 (!) 55 60  Resp: 16 16 18 20   Temp: 98.1 F (36.7 C) 97.7 F (36.5 C) 97.6 F (36.4 C) 97.8 F (36.6 C)  TempSrc: Oral Oral Oral Oral  SpO2: 90% 93% 91% 95%  Weight:      Height:        Intake/Output Summary (Last 24 hours) at 03/16/2020 1535 Last data filed at 03/16/2020 03/18/2020 Gross per 24 hour  Intake 100 ml  Output 1100 ml  Net -1000 ml   Filed Weights   03/13/20 1944  Weight: 83.9 kg    Examination:  Constitutional: NAD, AAOx3 HEENT: conjunctivae and lids normal, EOMI CV: RRR no M,R,G. Distal pulses +2.  No cyanosis.   RESP: CTA B/L, normal respiratory effort, on 1L GI: +BS, NTND Extremities: No effusions, edema in BLE SKIN: warm, dry and intact Neuro: II - XII grossly intact.  Sensation intact Psych: Normal mood and affect.  Appropriate judgement and reason   DVT prophylaxis: Lovenox SQ Code Status: Full code  Family Communication:  Status is: inpatient Dispo:   The patient is from: home Anticipated d/c is to: home Anticipated d/c date is: 1-2 days Patient currently is not medically stable to d/c due to: only on day  3 of Remdesivir, still on supplemental O2, need to taper off solumedrol.    Consultants:   None  Procedures:  Antimicrobials:   Data Reviewed: I have personally reviewed following labs and imaging studies  CBC: Recent Labs  Lab 03/13/20 1954 03/15/20 0551 03/16/20 0637  WBC 2.6* 4.5 8.0  NEUTROABS 1.6* 3.7 6.8   HGB 15.7 13.1 13.7  HCT 46.2 38.3* 41.1  MCV 88.5 88.2 88.8  PLT 128* 144* 199   Basic Metabolic Panel: Recent Labs  Lab 03/13/20 1954 03/15/20 0551 03/16/20 0637  NA 135 141 139  K 3.6 4.2 4.3  CL 98 105 104  CO2 26 25 27   GLUCOSE 113* 153* 159*  BUN 17 20 24*  CREATININE 0.81 0.58* 0.69  CALCIUM 9.2 9.5 9.5   GFR: Estimated Creatinine Clearance: 104 mL/min (by C-G formula based on SCr of 0.69 mg/dL). Liver Function Tests: Recent Labs  Lab 03/13/20 1954 03/15/20 0551 03/16/20 0637  AST 58* 39 35  ALT 61* 49* 56*  ALKPHOS 73 52 55  BILITOT 0.9 0.6 0.6  PROT 7.0 5.6* 5.8*  ALBUMIN 3.6 2.7* 2.9*   No results for input(s): LIPASE, AMYLASE in the last 168 hours. No results for input(s): AMMONIA in the last 168 hours. Coagulation Profile: No results for input(s): INR, PROTIME in the last 168 hours. Cardiac Enzymes: No results for input(s): CKTOTAL, CKMB, CKMBINDEX, TROPONINI in the last 168 hours. BNP (last 3 results) No results for input(s): PROBNP in the last 8760 hours. HbA1C: No results for input(s): HGBA1C in the last 72 hours. CBG: Recent Labs  Lab 03/15/20 1143 03/15/20 1717 03/15/20 2057 03/16/20 0854 03/16/20 1249  GLUCAP 192* 176* 166* 162* 186*   Lipid Profile: No results for input(s): CHOL, HDL, LDLCALC, TRIG, CHOLHDL, LDLDIRECT in the last 72 hours. Thyroid Function Tests: No results for input(s): TSH, T4TOTAL, FREET4, T3FREE, THYROIDAB in the last 72 hours. Anemia Panel: Recent Labs    03/14/20 0708  FERRITIN 495*   Sepsis Labs: Recent Labs  Lab 03/14/20 0708  PROCALCITON <0.10    Recent Results (from the past 240 hour(s))  SARS Coronavirus 2 by RT PCR (hospital order, performed in [facility] hospital lab) Nasopharyngeal Nasopharyngeal Swab     Status: Abnormal   Collection Time: 03/13/20  7:54 PM   Specimen: Nasopharyngeal Swab  Result Value Ref Range Status   SARS Coronavirus 2 POSITIVE (A) NEGATIVE Final    Comment: RESULT  CALLED TO, READ BACK BY AND VERIFIED WITH: LISA THOMPSON 03/13/20 AT 2112 BY AR (NOTE) SARS-CoV-2 target nucleic acids are DETECTED  SARS-CoV-2 RNA is generally detectable in upper respiratory specimens  during the acute phase of infection.  Positive results are indicative  of the presence of the identified virus, but do not rule out bacterial infection or co-infection with other pathogens not detected by the test.  Clinical correlation with patient history and  other diagnostic information is necessary to determine patient infection status.  The expected result is negative.  Fact Sheet for Patients:   2113   Fact Sheet for Healthcare Providers:   BoilerBrush.com.cy    This test is not yet approved or cleared by the https://pope.com/ FDA and  has been authorized for detection and/or diagnosis of SARS-CoV-2 by FDA under an Emergency Use Authorization (EUA).  This EUA will remain in effect (meaning this t est can be used) for the duration of  the COVID-19 declaration under Section 564(b)(1) of the Act, 21 U.S.C. section 360-bbb-3(b)(1), unless  the authorization is terminated or revoked sooner.  Performed at [facility], [address], [place], [place] [address]      Radiology Studies: No results found.  Scheduled Meds: . acidophilus  1 capsule Oral Daily  . amLODipine  10 mg Oral Daily  . vitamin C  500 mg Oral Daily  . aspirin EC  81 mg Oral Daily  . baricitinib  4 mg Oral Daily  . cholecalciferol  1,000 Units Oral Daily  . enoxaparin (LOVENOX) injection  40 mg Subcutaneous Q24H  . ezetimibe  10 mg Oral Daily  . famotidine  20 mg Oral BID  . guaiFENesin  600 mg Oral BID  . insulin aspart  0-20 Units Subcutaneous TID PC & HS  . insulin detemir  0.15 Units/kg Subcutaneous BID  . losartan  75 mg Oral Daily  . mouth rinse  15 mL Mouth Rinse BID  . methylPREDNISolone (SOLU-MEDROL) injection  1 mg/kg  Intravenous Q12H   Followed by  . [START ON 03/17/2020] predniSONE  50 mg Oral Daily  . ondansetron  4 mg Oral Once  . pantoprazole  40 mg Oral Daily  . rosuvastatin  40 mg Oral q1800  . zinc sulfate  220 mg Oral Daily   Continuous Infusions: . remdesivir 200 mg in sodium chloride 0.9% 250 mL IVPB     Followed by  . remdesivir 100 mg in NS 100 mL 100 mg (03/16/20 0909)     LOS: 2 days    Darlin Priestly, MD [facility]  If 7PM-7AM, please contact night-coverage [email or web address]  03/16/2020, 3:35 PM

## 2020-03-17 DIAGNOSIS — I2510 Atherosclerotic heart disease of native coronary artery without angina pectoris: Secondary | ICD-10-CM | POA: Diagnosis present

## 2020-03-17 DIAGNOSIS — R739 Hyperglycemia, unspecified: Secondary | ICD-10-CM | POA: Diagnosis present

## 2020-03-17 DIAGNOSIS — I739 Peripheral vascular disease, unspecified: Secondary | ICD-10-CM | POA: Diagnosis present

## 2020-03-17 LAB — CBC
HCT: 40.2 % (ref 39.0–52.0)
Hemoglobin: 14.1 g/dL (ref 13.0–17.0)
MCH: 30.5 pg (ref 26.0–34.0)
MCHC: 35.1 g/dL (ref 30.0–36.0)
MCV: 87 fL (ref 80.0–100.0)
Platelets: 195 10*3/uL (ref 150–400)
RBC: 4.62 MIL/uL (ref 4.22–5.81)
RDW: 12.5 % (ref 11.5–15.5)
WBC: 6.9 10*3/uL (ref 4.0–10.5)
nRBC: 0 % (ref 0.0–0.2)

## 2020-03-17 LAB — BASIC METABOLIC PANEL
Anion gap: 8 (ref 5–15)
BUN: 22 mg/dL (ref 8–23)
CO2: 26 mmol/L (ref 22–32)
Calcium: 9 mg/dL (ref 8.9–10.3)
Chloride: 105 mmol/L (ref 98–111)
Creatinine, Ser: 0.66 mg/dL (ref 0.61–1.24)
GFR calc Af Amer: >60 mL/min (ref >60)
GFR calc non Af Amer: >60 mL/min (ref >60)
Glucose, Bld: 134 mg/dL — ABNORMAL HIGH (ref 70–99)
Potassium: 3.7 mmol/L (ref 3.5–5.1)
Sodium: 139 mmol/L (ref 135–145)

## 2020-03-17 LAB — GLUCOSE, CAPILLARY
Glucose-Capillary: 121 mg/dL — ABNORMAL HIGH (ref 70–99)
Glucose-Capillary: 232 mg/dL — ABNORMAL HIGH (ref 70–99)
Glucose-Capillary: 175 mg/dL — ABNORMAL HIGH (ref 70–99)
Glucose-Capillary: 198 mg/dL — ABNORMAL HIGH (ref 70–99)
Glucose-Capillary: 144 mg/dL — ABNORMAL HIGH (ref 70–99)
Glucose-Capillary: 184 mg/dL — ABNORMAL HIGH (ref 70–99)

## 2020-03-17 LAB — MAGNESIUM: Magnesium: 2.5 mg/dL — ABNORMAL HIGH (ref 1.7–2.4)

## 2020-03-17 LAB — C-REACTIVE PROTEIN: CRP: 0.9 mg/dL (ref <1.0)

## 2020-03-17 NOTE — Progress Notes (Signed)
PROGRESS NOTE    Curtis Sherman  DVV:[id number] DOB: 1956-02-09 DOA: 03/14/2020 PCP: Marina Goodell, MD   Brief Narrative: Taken from H&P Curtis Sherman is a 64 y.o. male with medical history significant for diabetes mellitus, hypertension coronary artery disease, peripheral vascular disease and dyslipidemia who presents to the ER for evaluation of weakness, cough, shortness of breath, nausea, vomiting and diarrhea which she has had for about 10 days.  Patient states he started having symptoms on Monday, September 6 and tested positive for the COVID-19 virus on Thursday, September 9.  Patient is unvaccinated. CTA negative for PE but positive for bilateral infiltrates consistent with COVID-19 pneumonia.  Hypoxic requiring 4 L of oxygen.  Started on remdesivir, steroid and baricitinib.  Subjective: Pt reported breathing better.  No complaints.  Normal oral intake.   Assessment & Plan:   Principal Problem:   Pneumonia due to COVID-19 virus Active Problems:   GERD (gastroesophageal reflux disease)   Hyperlipidemia, unspecified   HTN (hypertension)   Diet-controlled type 2 diabetes mellitus (HCC)   Acute hypoxemic respiratory failure due to COVID-19 (HCC)   Transaminitis   Thrombocytopenia (HCC)   Hyperglycemia   CAD (coronary artery disease)   PVD (peripheral vascular disease) (HCC)  Acute hypoxic respiratory failure secondary to COVID-19 pneumonia. Needed 4L initially.  Chest x-ray with bilateral opacities.  Elevated inflammatory markers which now started trending down.  He was started on remdesivir, steroid and baricitinib --O2 requirement down to 1L today --CRP down to normal today PLAN: -Continue remdesivir  -taper steroid to prednisone -Continue baricitinib -Continue with supplemental oxygen-try weaning as tolerated.  Diabetes mellitus with hyperglycemia exacerbated by steroid use. --last A1c 7.4 in April 2021 --Levemir 13u BID --SSI TID --continue current  insulin regimen  Hypertension.   Blood pressure within goal. --continue home losartan and amlodipine --Hold home metop for now due to low HR  History of CAD.  No chest pain. -Continue home dose of aspirin, Crestor and Zetia. -Holding metoprolol due to bradycardia.  Thrombocytopenia, resolved -Continue to monitor.  Transaminitis.  Most likely secondary to COVID-19 infection. Improved.  GERD. --continue home PPI   Objective: Vitals:   03/17/20 0529 03/17/20 0755 03/17/20 1145 03/17/20 1631  BP: 124/65 (!) 145/52 (!) 152/61 (!) 144/72  Pulse: (!) 45 (!) 44  82  Resp: 16 16 16 18   Temp: 97.8 F (36.6 C) 98 F (36.7 C) 98.5 F (36.9 C) 98.3 F (36.8 C)  TempSrc: Oral Oral Oral   SpO2: 92% 90% 91% 93%  Weight:      Height:        Intake/Output Summary (Last 24 hours) at 03/17/2020 1940 Last data filed at 03/17/2020 1142 Gross per 24 hour  Intake 200 ml  Output 850 ml  Net -650 ml   Filed Weights   03/13/20 1944  Weight: 83.9 kg    Examination:  Constitutional: NAD, AAOx3 HEENT: conjunctivae and lids normal, EOMI CV: RRR no M,R,G. Distal pulses +2.  No cyanosis.   RESP: CTA B/L, normal respiratory effort, on 1L GI: +BS, NTND Extremities: No effusions, edema in BLE SKIN: warm, dry and intact Neuro: II - XII grossly intact.  Sensation intact Psych: Normal mood and affect.  Appropriate judgement and reason    DVT prophylaxis: Lovenox SQ Code Status: Full code  Family Communication:  Status is: inpatient Dispo:   The patient is from: home Anticipated d/c is to: home Anticipated d/c date is: tomorrow after last dose of Remdesivir. Patient  currently is medically stable to d/c.    Consultants:   None  Procedures:  Antimicrobials:   Data Reviewed: I have personally reviewed following labs and imaging studies  CBC: Recent Labs  Lab 03/13/20 1954 03/15/20 0551 03/16/20 0637 03/17/20 0622  WBC 2.6* 4.5 8.0 6.9  NEUTROABS 1.6* 3.7 6.8  --   HGB  15.7 13.1 13.7 14.1  HCT 46.2 38.3* 41.1 40.2  MCV 88.5 88.2 88.8 87.0  PLT 128* 144* 199 195   Basic Metabolic Panel: Recent Labs  Lab 03/13/20 1954 03/15/20 0551 03/16/20 0637 03/17/20 0622  NA 135 141 139 139  K 3.6 4.2 4.3 3.7  CL 98 105 104 105  CO2 26 25 27 26   GLUCOSE 113* 153* 159* 134*  BUN 17 20 24* 22  CREATININE 0.81 0.58* 0.69 0.66  CALCIUM 9.2 9.5 9.5 9.0  MG  --   --   --  2.5*   GFR: Estimated Creatinine Clearance: 104 mL/min (by C-G formula based on SCr of 0.66 mg/dL). Liver Function Tests: Recent Labs  Lab 03/13/20 1954 03/15/20 0551 03/16/20 0637  AST 58* 39 35  ALT 61* 49* 56*  ALKPHOS 73 52 55  BILITOT 0.9 0.6 0.6  PROT 7.0 5.6* 5.8*  ALBUMIN 3.6 2.7* 2.9*   No results for input(s): LIPASE, AMYLASE in the last 168 hours. No results for input(s): AMMONIA in the last 168 hours. Coagulation Profile: No results for input(s): INR, PROTIME in the last 168 hours. Cardiac Enzymes: No results for input(s): CKTOTAL, CKMB, CKMBINDEX, TROPONINI in the last 168 hours. BNP (last 3 results) No results for input(s): PROBNP in the last 8760 hours. HbA1C: No results for input(s): HGBA1C in the last 72 hours. CBG: Recent Labs  Lab 03/17/20 0754 03/17/20 1042 03/17/20 1144 03/17/20 1344 03/17/20 1632  GLUCAP 121* 232* 175* 198* 144*   Lipid Profile: No results for input(s): CHOL, HDL, LDLCALC, TRIG, CHOLHDL, LDLDIRECT in the last 72 hours. Thyroid Function Tests: No results for input(s): TSH, T4TOTAL, FREET4, T3FREE, THYROIDAB in the last 72 hours. Anemia Panel: No results for input(s): VITAMINB12, FOLATE, FERRITIN, TIBC, IRON, RETICCTPCT in the last 72 hours. Sepsis Labs: Recent Labs  Lab 03/14/20 0708  PROCALCITON <0.10    Recent Results (from the past 240 hour(s))  SARS Coronavirus 2 by RT PCR (hospital order, performed in [facility] hospital lab) Nasopharyngeal Nasopharyngeal Swab     Status: Abnormal   Collection Time: 03/13/20  7:54 PM     Specimen: Nasopharyngeal Swab  Result Value Ref Range Status   SARS Coronavirus 2 POSITIVE (A) NEGATIVE Final    Comment: RESULT CALLED TO, READ BACK BY AND VERIFIED WITH: LISA THOMPSON 03/13/20 AT 2112 BY AR (NOTE) SARS-CoV-2 target nucleic acids are DETECTED  SARS-CoV-2 RNA is generally detectable in upper respiratory specimens  during the acute phase of infection.  Positive results are indicative  of the presence of the identified virus, but do not rule out bacterial infection or co-infection with other pathogens not detected by the test.  Clinical correlation with patient history and  other diagnostic information is necessary to determine patient infection status.  The expected result is negative.  Fact Sheet for Patients:   2113   Fact Sheet for Healthcare Providers:   BoilerBrush.com.cy    This test is not yet approved or cleared by the https://pope.com/ FDA and  has been authorized for detection and/or diagnosis of SARS-CoV-2 by FDA under an Emergency Use Authorization (EUA).  This EUA  will remain in effect (meaning this t est can be used) for the duration of  the COVID-19 declaration under Section 564(b)(1) of the Act, 21 U.S.C. section 360-bbb-3(b)(1), unless the authorization is terminated or revoked sooner.  Performed at [facility], [address], [place], [place] [address]      Radiology Studies: No results found.  Scheduled Meds: . acidophilus  1 capsule Oral Daily  . amLODipine  10 mg Oral Daily  . vitamin C  500 mg Oral Daily  . aspirin EC  81 mg Oral Daily  . baricitinib  4 mg Oral Daily  . cholecalciferol  1,000 Units Oral Daily  . enoxaparin (LOVENOX) injection  40 mg Subcutaneous Q24H  . ezetimibe  10 mg Oral Daily  . guaiFENesin  600 mg Oral BID  . insulin aspart  0-20 Units Subcutaneous TID PC & HS  . insulin detemir  0.15 Units/kg Subcutaneous BID  . losartan  75 mg Oral  Daily  . mouth rinse  15 mL Mouth Rinse BID  . ondansetron  4 mg Oral Once  . pantoprazole  40 mg Oral Daily  . predniSONE  50 mg Oral Daily  . rosuvastatin  40 mg Oral q1800  . zinc sulfate  220 mg Oral Daily   Continuous Infusions: . remdesivir 200 mg in sodium chloride 0.9% 250 mL IVPB     Followed by  . remdesivir 100 mg in NS 100 mL Stopped (03/17/20 1137)     LOS: 3 days    Darlin Priestly, MD [facility]  If 7PM-7AM, please contact night-coverage [email or web address]  03/17/2020, 7:40 PM

## 2020-03-18 LAB — CBC
HCT: 44.9 % (ref 39.0–52.0)
Hemoglobin: 15.2 g/dL (ref 13.0–17.0)
MCH: 30.1 pg (ref 26.0–34.0)
MCHC: 33.9 g/dL (ref 30.0–36.0)
MCV: 88.9 fL (ref 80.0–100.0)
Platelets: 215 10*3/uL (ref 150–400)
RBC: 5.05 MIL/uL (ref 4.22–5.81)
RDW: 12.4 % (ref 11.5–15.5)
WBC: 6.9 10*3/uL (ref 4.0–10.5)
nRBC: 0.3 % — ABNORMAL HIGH (ref 0.0–0.2)

## 2020-03-18 LAB — BASIC METABOLIC PANEL
Anion gap: 7 (ref 5–15)
BUN: 19 mg/dL (ref 8–23)
CO2: 30 mmol/L (ref 22–32)
Calcium: 9.1 mg/dL (ref 8.9–10.3)
Chloride: 104 mmol/L (ref 98–111)
Creatinine, Ser: 0.67 mg/dL (ref 0.61–1.24)
GFR calc Af Amer: >60 mL/min (ref >60)
GFR calc non Af Amer: >60 mL/min (ref >60)
Glucose, Bld: 67 mg/dL — ABNORMAL LOW (ref 70–99)
Potassium: 3.7 mmol/L (ref 3.5–5.1)
Sodium: 141 mmol/L (ref 135–145)

## 2020-03-18 LAB — GLUCOSE, CAPILLARY
Glucose-Capillary: 62 mg/dL — ABNORMAL LOW (ref 70–99)
Glucose-Capillary: 112 mg/dL — ABNORMAL HIGH (ref 70–99)
Glucose-Capillary: 115 mg/dL — ABNORMAL HIGH (ref 70–99)

## 2020-03-18 LAB — C-REACTIVE PROTEIN: CRP: 0.6 mg/dL (ref <1.0)

## 2020-03-18 LAB — MAGNESIUM: Magnesium: 2.4 mg/dL (ref 1.7–2.4)

## 2020-03-18 MED ORDER — METOPROLOL TARTRATE 25 MG PO TABS
ORAL_TABLET | ORAL | 1 refills | Status: DC
Start: 2020-03-18 — End: 2023-01-22

## 2020-03-18 NOTE — Progress Notes (Signed)
SATURATION QUALIFICATIONS: (This note is used to comply with regulatory documentation for home oxygen)  Patient Saturations on Room Air at Rest = 88%  Patient Saturations on Room Air while Ambulating = 82%  Patient Saturations on 3 Liters of oxygen while Ambulating = 89%  Please briefly explain why patient needs home oxygen: patient desaturates when ambulating and not on oxygen.

## 2020-03-18 NOTE — Plan of Care (Signed)

## 2020-03-18 NOTE — Progress Notes (Signed)
Pt decline ambulation stating he would "in the morning"

## 2020-03-18 NOTE — TOC Initial Note (Signed)
Transition of Care [facility]) - Initial/Assessment Note    Patient Details  Name: Curtis Sherman MRN: [id number] Date of Birth: 1956-01-14  Transition of Care [facility]) CM/SW Contact:    Allayne Butcher, RN Phone Number: 03/18/2020, 12:44 PM  Clinical Narrative:                 Patient has been cleared for discharge home, admitted to the hospital with COVID.  Patient does need home O2 at 3L.  RNCM able to speak with patient via phone.  Patient is from home with his wife.  [facility] given referral for home O2 they will be in contact with patient and wife about oxygen delivery.  Patient has a pulse oximeter at home to check his oxygen levels, and he knows to follow up with his PCP after discharge.   Patient's wife will be coming to pick him up today.   Expected Discharge Plan: Home/Self Care Barriers to Discharge: Barriers Resolved   Patient Goals and CMS Choice        Expected Discharge Plan and Services Expected Discharge Plan: Home/Self Care   Discharge Planning Services: CM Consult   Living arrangements for the past 2 months: Single Family Home Expected Discharge Date: 03/18/20               DME Arranged: Oxygen DME Agency: Patsy Lager Date DME Agency Contacted: 03/18/20 Time DME Agency Contacted: [phone] Representative spoke with at DME Agency: Morrie Sheldon HH Arranged: NA          Prior Living Arrangements/Services Living arrangements for the past 2 months: Single Family Home Lives with:: Spouse Patient language and need for interpreter reviewed:: Yes        Need for Family Participation in Patient Care: Yes (Comment) (COVID) Care giver support system in place?: Yes (comment) (wife)   Criminal Activity/Legal Involvement Pertinent to Current Situation/Hospitalization: No - Comment as needed  Activities of Daily Living Home Assistive Devices/Equipment: None ADL Screening (condition at time of admission) Patient's cognitive ability adequate to safely complete daily activities?:  Yes Is the patient deaf or have difficulty hearing?: No Does the patient have difficulty seeing, even when wearing glasses/contacts?: No Does the patient have difficulty concentrating, remembering, or making decisions?: No Patient able to express need for assistance with ADLs?: No Does the patient have difficulty dressing or bathing?: No Independently performs ADLs?: Yes (appropriate for developmental age) Does the patient have difficulty walking or climbing stairs?: No Weakness of Legs: None Weakness of Arms/Hands: None  Permission Sought/Granted Permission sought to share information with : Case Manager, Other (comment), Family Supports Permission granted to share information with : Yes, Verbal Permission Granted  Share Information with NAME: Viki  Permission granted to share info w AGENCY: [facility]  Permission granted to share info w Relationship: wife     Emotional Assessment   Attitude/Demeanor/Rapport: Engaged Affect (typically observed): Accepting Orientation: : Oriented to Self, Oriented to Place, Oriented to  Time, Oriented to Situation Alcohol / Substance Use: Not Applicable Psych Involvement: No (comment)  Admission diagnosis:  Acute hypoxemic respiratory failure due to COVID-19 (HCC) [U07.1, J96.01] Pneumonia due to COVID-19 virus [U07.1, J12.82] Patient Active Problem List   Diagnosis Date Noted  . Hyperglycemia 03/17/2020  . CAD (coronary artery disease) 03/17/2020  . PVD (peripheral vascular disease) (HCC) 03/17/2020  . Acute hypoxemic respiratory failure due to COVID-19 (HCC) 03/14/2020  . Pneumonia due to COVID-19 virus 03/14/2020  . Transaminitis 03/14/2020  . Thrombocytopenia (HCC) 03/14/2020  . 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 STEMI (ST elevation myocardial infarction) (HCC) 06/18/2017  . Chest pain, unspecified 06/17/2017  . GERD (gastroesophageal reflux disease) 06/17/2017  . Hyperlipidemia, unspecified 06/17/2017  . HTN (hypertension) 06/17/2017  . Angina  at rest [facility]) 06/17/2017  . Chronic back pain 01/31/2015  . Diet-controlled type 2 diabetes mellitus (HCC) 08/01/2014   PCP:  Marina Goodell, MD Pharmacy:   [facility] [address], [place] - [address] [place] [address] Phone: [phone] Fax: [phone]     Social Determinants of Health (SDOH) Interventions    Readmission Risk Interventions No flowsheet data found.

## 2020-03-18 NOTE — Discharge Summary (Signed)
Physician Discharge Summary   Curtis Sherman  male DOB: [birth year]  ZOX:[id number]  PCP: Marina GoodellFeldpausch, Dale E, MD  Admit date: 03/14/2020 Discharge date: 03/18/2020  Admitted From: home Disposition:  home CODE STATUS: Full code  Discharge Instructions    Discharge instructions   Complete by: As directed    You have completed your COVID treatment and doing well, however, your oxygen level did drop only with walking, so we are sending you home with 3 liters of supplemental oxygen to use only when you are walking or exerting yourself.  I expect that as you continue to recover, you should be able to get off supplemental oxygen.  Please follow up with your primary care doctor within a week after discharge to monitor your oxygen needs.  Your heart rate has been low, in 40's, even without your home Lopressor, so please hold this medication until followup with your outpatient doctor.   Dr. Darlin Priestlyina Felisia Balcom [facility]- -       Hospital Course:  For full details, please see H&P, progress notes, consult notes and ancillary notes.  Briefly,  Cleaster Corinimothy Dale Terryis a 64 y.o. malewith medical history significant fordiabetes mellitus, hypertension, coronary artery disease, peripheral vascular disease and dyslipidemia who presented to the ER for evaluation of weakness, cough, shortness of breath, nausea, vomiting and diarrhea for about 10 days.   Tested positive for the COVID-19 virus on Thursday, September 9.  Patient is unvaccinated. CTA negative for PE but positive for bilateral infiltrates consistent with COVID-19 pneumonia.  Hypoxic requiring 4 L of oxygen.  Started on remdesivir, steroid and baricitinib.  Acute hypoxic respiratory failure secondary to COVID-19 pneumonia. Needed 4L initially.  Chest x-ray with bilateral opacities.  CRP 5.8 on presentation.  Pt was started on remdesivir, steroid and baricitinib.  Prior to discharge, CRP had trended down to normal level.  O2 requirement was weaned down  to room air at rest prior to discharge, however, with walking, O2 sat dropped to mid-80's, necessitating 3L supplemental O2 to maintain sats >88%.  Pt was therefore discharged on 3L O2.  Pt was advised to follow up with outpatient provider to monitor his O2 needs.  Diabetes mellitus with hyperglycemia exacerbated by steroid use. last A1c 7.4 in April 2021.  Pt received Levemir 13u BID and SSI TID due to hyperglycemia exacerbated by steroid use.  Pt was discharged back to home metformin.  Further management of diabetic control per outpatient provider.  Hypertension Blood pressure within goal.  continued home losartan and amlodipine.  Home metop held due to low HR (40's even without metop).  History of CAD.   No chest pain.  Continued home dose of aspirin, Crestor and Zetia.  Home metop held due to low HR (40's even without metop).  Thrombocytopenia, resolved  Transaminitis, improved Most likely secondary to COVID-19 infection.  GERD. continued home PPI   Discharge Diagnoses:  Principal Problem:   Pneumonia due to COVID-19 virus Active Problems:   GERD (gastroesophageal reflux disease)   Hyperlipidemia, unspecified   HTN (hypertension)   Diet-controlled type 2 diabetes mellitus (HCC)   Acute hypoxemic respiratory failure due to COVID-19 (HCC)   Transaminitis   Thrombocytopenia (HCC)   Hyperglycemia   CAD (coronary artery disease)   PVD (peripheral vascular disease) (HCC)    Discharge Instructions:  Allergies as of 03/18/2020      Reactions   Codeine Nausea Only   Lisinopril Cough      Medication List    STOP taking these  medications   cephALEXin 500 MG capsule Commonly known as: KEFLEX   clopidogrel 75 MG tablet Commonly known as: PLAVIX   diphenhydrAMINE 25 mg capsule Commonly known as: BENADRYL   OVER THE COUNTER MEDICATION   oxyCODONE 5 MG immediate release tablet Commonly known as: Oxy IR/ROXICODONE     TAKE these medications   acetaminophen 325  MG tablet Commonly known as: TYLENOL Take 2 tablets (650 mg total) by mouth every 6 (six) hours as needed for mild pain.   amLODipine 10 MG tablet Commonly known as: NORVASC Take 10 mg by mouth daily.   aspirin 81 MG EC tablet Take 1 tablet (81 mg total) by mouth daily.   CULTURELLE PO Take by mouth daily.   ezetimibe 10 MG tablet Commonly known as: ZETIA Take 10 mg by mouth daily.   losartan 50 MG tablet Commonly known as: COZAAR Take 75 mg by mouth daily.   metFORMIN 500 MG tablet Commonly known as: GLUCOPHAGE Take 500 mg by mouth 2 (two) times daily with a meal.   metoprolol tartrate 25 MG tablet Commonly known as: LOPRESSOR Hold this medication until followup with your outpatient doctor, because your heart rate has been low (40's) in the hospital even without this. What changed:   how much to take  how to take this  when to take this  additional instructions   pantoprazole 40 MG tablet Commonly known as: PROTONIX Take 40 mg by mouth daily.   rosuvastatin 40 MG tablet Commonly known as: CRESTOR TAKE 1 TABLET BY MOUTH ONCE DAILY AT  6PM            Durable Medical Equipment  (From admission, onward)         Start     Ordered   03/18/20 1145  For home use only DME oxygen  Once       Question Answer Comment  Length of Need 6 Months   Mode or (Route) Nasal cannula   Liters per Minute 3   Frequency Continuous (stationary and portable oxygen unit needed)   Oxygen delivery system Gas      03/18/20 1144           Follow-up Information    Feldpausch, Madaline Guthrie, MD. Schedule an appointment as soon as possible for a visit in 1 week(s).   Specialty: Family Medicine Contact information: [address] [place] [address] [phone]               Allergies  Allergen Reactions  . Codeine Nausea Only  . Lisinopril Cough     The results of significant diagnostics from this hospitalization (including imaging, microbiology, ancillary and  laboratory) are listed below for reference.   Consultations:   Procedures/Studies: DG Chest 2 View  Result Date: 03/13/2020 CLINICAL DATA:  COVID exposure EXAM: CHEST - 2 VIEW COMPARISON:  September 28, 2017 FINDINGS: The heart size and mediastinal contours are within normal limits. Mildly hazy airspace opacity seen at the periphery of the right mid lung left lower lung. No large airspace consolidation or pleural effusion. Overlying median sternotomy wires are present. IMPRESSION: Mild bilateral hazy airspace opacities which could be due to atelectasis and/or early infectious etiology. Electronically Signed   By: Jonna Clark M.D.   On: 03/13/2020 20:20   CT Angio Chest PE W and/or Wo Contrast  Result Date: 03/14/2020 CLINICAL DATA:  COVID positive positive D-dimer fever short of breath EXAM: CT ANGIOGRAPHY CHEST WITH CONTRAST TECHNIQUE: Multidetector CT imaging of the  chest was performed using the standard protocol during bolus administration of intravenous contrast. Multiplanar CT image reconstructions and MIPs were obtained to evaluate the vascular anatomy. CONTRAST:  29mL OMNIPAQUE IOHEXOL 350 MG/ML SOLN COMPARISON:  Chest x-ray 03/13/2020 FINDINGS: Cardiovascular: Satisfactory opacification of the pulmonary arteries to the segmental level. No evidence of pulmonary embolism. Nonaneurysmal aorta. Mild aortic atherosclerosis. Status post CABG. Coronary vascular calcification. No significant pericardial effusion. Mediastinum/Nodes: Midline trachea. No thyroid mass. No suspicious adenopathy. Esophagus within normal limits Lungs/Pleura: Mild emphysematous disease. Moderate bilateral ground-glass densities and patchy consolidations. No pleural effusion or pneumothorax Upper Abdomen: No acute abnormality. Musculoskeletal: No chest wall abnormality. No acute or significant osseous findings. Review of the MIP images confirms the above findings. IMPRESSION: 1. Negative for acute pulmonary embolus. 2. Moderate  bilateral ground-glass densities and patchy consolidations, felt consistent with COVID pneumonia. 3. Emphysema and aortic atherosclerosis. Aortic Atherosclerosis (ICD10-I70.0) and Emphysema (ICD10-J43.9). Electronically Signed   By: Jasmine Pang M.D.   On: 03/14/2020 03:11      Labs: BNP (last 3 results) Recent Labs    03/14/20 0708  BNP 37.9   Basic Metabolic Panel: Recent Labs  Lab 03/13/20 1954 03/15/20 0551 03/16/20 0637 03/17/20 0622 03/18/20 0643  NA 135 141 139 139 141  K 3.6 4.2 4.3 3.7 3.7  CL 98 105 104 105 104  CO2 26 25 27 26 30   GLUCOSE 113* 153* 159* 134* 67*  BUN 17 20 24* 22 19  CREATININE 0.81 0.58* 0.69 0.66 0.67  CALCIUM 9.2 9.5 9.5 9.0 9.1  MG  --   --   --  2.5* 2.4   Liver Function Tests: Recent Labs  Lab 03/13/20 1954 03/15/20 0551 03/16/20 0637  AST 58* 39 35  ALT 61* 49* 56*  ALKPHOS 73 52 55  BILITOT 0.9 0.6 0.6  PROT 7.0 5.6* 5.8*  ALBUMIN 3.6 2.7* 2.9*   No results for input(s): LIPASE, AMYLASE in the last 168 hours. No results for input(s): AMMONIA in the last 168 hours. CBC: Recent Labs  Lab 03/13/20 1954 03/15/20 0551 03/16/20 0637 03/17/20 0622 03/18/20 0643  WBC 2.6* 4.5 8.0 6.9 6.9  NEUTROABS 1.6* 3.7 6.8  --   --   HGB 15.7 13.1 13.7 14.1 15.2  HCT 46.2 38.3* 41.1 40.2 44.9  MCV 88.5 88.2 88.8 87.0 88.9  PLT 128* 144* 199 195 215   Cardiac Enzymes: No results for input(s): CKTOTAL, CKMB, CKMBINDEX, TROPONINI in the last 168 hours. BNP: Invalid input(s): POCBNP CBG: Recent Labs  Lab 03/17/20 1344 03/17/20 1632 03/17/20 2130 03/18/20 0758 03/18/20 1017  GLUCAP 198* 144* 184* 62* 112*   D-Dimer No results for input(s): DDIMER in the last 72 hours. Hgb A1c No results for input(s): HGBA1C in the last 72 hours. Lipid Profile No results for input(s): CHOL, HDL, LDLCALC, TRIG, CHOLHDL, LDLDIRECT in the last 72 hours. Thyroid function studies No results for input(s): TSH, T4TOTAL, T3FREE, THYROIDAB in the last  72 hours.  Invalid input(s): FREET3 Anemia work up No results for input(s): VITAMINB12, FOLATE, FERRITIN, TIBC, IRON, RETICCTPCT in the last 72 hours. Urinalysis    Component Value Date/Time   COLORURINE YELLOW 06/17/2017 2354   APPEARANCEUR CLEAR 06/17/2017 2354   LABSPEC 1.033 (H) 06/17/2017 2354   PHURINE 6.0 06/17/2017 2354   GLUCOSEU NEGATIVE 06/17/2017 2354   HGBUR MODERATE (A) 06/17/2017 2354   BILIRUBINUR NEGATIVE 06/17/2017 2354   KETONESUR NEGATIVE 06/17/2017 2354   PROTEINUR NEGATIVE 06/17/2017 2354   NITRITE NEGATIVE 06/17/2017  2354   LEUKOCYTESUR NEGATIVE 06/17/2017 2354   Sepsis Labs Invalid input(s): PROCALCITONIN,  WBC,  LACTICIDVEN Microbiology Recent Results (from the past 240 hour(s))  SARS Coronavirus 2 by RT PCR (hospital order, performed in [facility] hospital lab) Nasopharyngeal Nasopharyngeal Swab     Status: Abnormal   Collection Time: 03/13/20  7:54 PM   Specimen: Nasopharyngeal Swab  Result Value Ref Range Status   SARS Coronavirus 2 POSITIVE (A) NEGATIVE Final    Comment: RESULT CALLED TO, READ BACK BY AND VERIFIED WITH: LISA THOMPSON 03/13/20 AT 2112 BY AR (NOTE) SARS-CoV-2 target nucleic acids are DETECTED  SARS-CoV-2 RNA is generally detectable in upper respiratory specimens  during the acute phase of infection.  Positive results are indicative  of the presence of the identified virus, but do not rule out bacterial infection or co-infection with other pathogens not detected by the test.  Clinical correlation with patient history and  other diagnostic information is necessary to determine patient infection status.  The expected result is negative.  Fact Sheet for Patients:   BoilerBrush.com.cy   Fact Sheet for Healthcare Providers:   https://pope.com/    This test is not yet approved or cleared by the [place] FDA and  has been authorized for detection and/or diagnosis of SARS-CoV-2  by FDA under an Emergency Use Authorization (EUA).  This EUA will remain in effect (meaning this t est can be used) for the duration of  the COVID-19 declaration under Section 564(b)(1) of the Act, 21 U.S.C. section 360-bbb-3(b)(1), unless the authorization is terminated or revoked sooner.  Performed at [facility], [address] [place], [place] [address]      Total time spend on discharging this patient, including the last patient exam, discussing the hospital stay, instructions for ongoing care as it relates to all pertinent caregivers, as well as preparing the medical discharge records, prescriptions, and/or referrals as applicable, is 50 minutes.    Darlin Priestly, MD  [facility] 03/18/2020, 11:45 AM  If 7PM-7AM, please contact night-coverage

## 2020-03-18 NOTE — Progress Notes (Signed)
Patient discharged home on home O2 via POV. After Visit Summary reviewed with patient. Patient had no questions at this time.

## 2020-06-07 ENCOUNTER — Ambulatory Visit
Admit: 2020-06-07 | Discharge: 2020-06-08 | Payer: MEDICARE | Attending: Cardiovascular Disease | Primary: Cardiovascular Disease

## 2020-06-07 DIAGNOSIS — I2510 Atherosclerotic heart disease of native coronary artery without angina pectoris: Principal | ICD-10-CM

## 2020-10-15 MED ORDER — EZETIMIBE 10 MG TABLET
ORAL_TABLET | 0 refills | 0 days | Status: CP
Start: 2020-10-15 — End: ?

## 2020-11-08 MED ORDER — AMLODIPINE 10 MG TABLET
ORAL_TABLET | Freq: Every day | ORAL | 0 refills | 90.00000 days | Status: CP
Start: 2020-11-08 — End: 2021-02-06

## 2020-11-20 MED ORDER — METOPROLOL TARTRATE 25 MG TABLET
ORAL_TABLET | ORAL | 1 refills | 0.00000 days | Status: CP
Start: 2020-11-20 — End: ?

## 2020-12-06 ENCOUNTER — Ambulatory Visit
Admit: 2020-12-06 | Discharge: 2020-12-07 | Payer: MEDICARE | Attending: Cardiovascular Disease | Primary: Cardiovascular Disease

## 2020-12-06 DIAGNOSIS — I2510 Atherosclerotic heart disease of native coronary artery without angina pectoris: Principal | ICD-10-CM

## 2020-12-17 MED ORDER — LOSARTAN 50 MG TABLET
ORAL_TABLET | 0 refills | 0 days | Status: CP
Start: 2020-12-17 — End: ?

## 2021-01-14 MED ORDER — EZETIMIBE 10 MG TABLET
ORAL_TABLET | 0 refills | 0 days | Status: CP
Start: 2021-01-14 — End: ?

## 2021-02-04 MED ORDER — AMLODIPINE 10 MG TABLET
ORAL_TABLET | ORAL | 1 refills | 0.00000 days | Status: CP
Start: 2021-02-04 — End: ?

## 2021-03-11 MED ORDER — LOSARTAN 50 MG TABLET
ORAL_TABLET | ORAL | 2 refills | 0.00000 days | Status: CP
Start: 2021-03-11 — End: 2021-06-09

## 2021-04-30 MED ORDER — EZETIMIBE 10 MG TABLET
ORAL_TABLET | 0 refills | 0 days | Status: CP
Start: 2021-04-30 — End: ?

## 2021-05-22 MED ORDER — METOPROLOL TARTRATE 25 MG TABLET
ORAL_TABLET | ORAL | 0 refills | 0.00000 days | Status: CP
Start: 2021-05-22 — End: ?

## 2021-07-11 ENCOUNTER — Ambulatory Visit
Admit: 2021-07-11 | Discharge: 2021-07-12 | Payer: MEDICARE | Attending: Cardiovascular Disease | Primary: Cardiovascular Disease

## 2021-07-11 DIAGNOSIS — I2510 Atherosclerotic heart disease of native coronary artery without angina pectoris: Principal | ICD-10-CM

## 2021-07-11 DIAGNOSIS — I10 Essential (primary) hypertension: Principal | ICD-10-CM

## 2021-07-11 DIAGNOSIS — E785 Hyperlipidemia, unspecified: Principal | ICD-10-CM

## 2021-07-11 MED ORDER — LOSARTAN 50 MG TABLET
ORAL_TABLET | Freq: Every day | ORAL | 3 refills | 90.00000 days | Status: CP
Start: 2021-07-11 — End: 2022-07-06

## 2021-08-19 MED ORDER — EZETIMIBE 10 MG TABLET
ORAL_TABLET | 3 refills | 0 days | Status: CP
Start: 2021-08-19 — End: ?

## 2021-08-19 MED ORDER — AMLODIPINE 10 MG TABLET
ORAL_TABLET | 3 refills | 0 days | Status: CP
Start: 2021-08-19 — End: ?

## 2021-10-10 ENCOUNTER — Ambulatory Visit
Admit: 2021-10-10 | Discharge: 2021-10-11 | Payer: MEDICARE | Attending: Cardiovascular Disease | Primary: Cardiovascular Disease

## 2021-10-10 DIAGNOSIS — I2510 Atherosclerotic heart disease of native coronary artery without angina pectoris: Principal | ICD-10-CM

## 2021-10-14 ENCOUNTER — Ambulatory Visit: Payer: Medicare HMO | Admitting: Certified Registered"

## 2021-10-14 ENCOUNTER — Encounter
Admission: RE | Disposition: A | Discharge status: Home / Self Care | Payer: Self-pay | Source: Ambulatory Visit | Attending: Gastroenterology

## 2021-10-14 ENCOUNTER — Ambulatory Visit
Admission: RE | Admit: 2021-10-14 | Discharge: 2021-10-14 | Disposition: A | Discharge status: Home / Self Care | Payer: Medicare HMO | Source: Ambulatory Visit | Attending: Gastroenterology | Admitting: Gastroenterology

## 2021-10-14 ENCOUNTER — Encounter: Payer: Self-pay | Admitting: *Deleted

## 2021-10-14 DIAGNOSIS — Z1211 Encounter for screening for malignant neoplasm of colon: Secondary | ICD-10-CM | POA: Insufficient documentation

## 2021-10-14 DIAGNOSIS — K219 Gastro-esophageal reflux disease without esophagitis: Secondary | ICD-10-CM | POA: Diagnosis not present

## 2021-10-14 DIAGNOSIS — Z800 Family history of malignant neoplasm of digestive organs: Secondary | ICD-10-CM | POA: Diagnosis not present

## 2021-10-14 DIAGNOSIS — I2510 Atherosclerotic heart disease of native coronary artery without angina pectoris: Secondary | ICD-10-CM | POA: Insufficient documentation

## 2021-10-14 DIAGNOSIS — I10 Essential (primary) hypertension: Secondary | ICD-10-CM | POA: Diagnosis not present

## 2021-10-14 DIAGNOSIS — E1151 Type 2 diabetes mellitus with diabetic peripheral angiopathy without gangrene: Secondary | ICD-10-CM | POA: Insufficient documentation

## 2021-10-14 DIAGNOSIS — K5730 Diverticulosis of large intestine without perforation or abscess without bleeding: Secondary | ICD-10-CM | POA: Insufficient documentation

## 2021-10-14 DIAGNOSIS — Z955 Presence of coronary angioplasty implant and graft: Secondary | ICD-10-CM | POA: Diagnosis not present

## 2021-10-14 DIAGNOSIS — Z7984 Long term (current) use of oral hypoglycemic drugs: Secondary | ICD-10-CM | POA: Insufficient documentation

## 2021-10-14 DIAGNOSIS — K640 First degree hemorrhoids: Secondary | ICD-10-CM | POA: Diagnosis not present

## 2021-10-14 DIAGNOSIS — G4730 Sleep apnea, unspecified: Secondary | ICD-10-CM | POA: Diagnosis not present

## 2021-10-14 DIAGNOSIS — E7800 Pure hypercholesterolemia, unspecified: Secondary | ICD-10-CM | POA: Diagnosis not present

## 2021-10-14 DIAGNOSIS — Z86010 Personal history of colonic polyps: Secondary | ICD-10-CM | POA: Insufficient documentation

## 2021-10-14 DIAGNOSIS — I252 Old myocardial infarction: Secondary | ICD-10-CM | POA: Diagnosis not present

## 2021-10-14 HISTORY — DX: Essential (primary) hypertension: I10

## 2021-10-14 HISTORY — PX: COLONOSCOPY WITH PROPOFOL: SHX5780

## 2021-10-14 HISTORY — DX: Sleep apnea, unspecified: G47.30

## 2021-10-14 HISTORY — DX: Other chronic pain: G89.29

## 2021-10-14 HISTORY — DX: Angina pectoris, unspecified: I20.9

## 2021-10-14 HISTORY — DX: Calculus of kidney: N20.0

## 2021-10-14 HISTORY — DX: Atherosclerotic heart disease of native coronary artery without angina pectoris: I25.10

## 2021-10-14 SURGERY — COLONOSCOPY WITH PROPOFOL
Anesthesia: General

## 2021-10-14 MED ORDER — PROPOFOL 500 MG/50ML IV EMUL
INTRAVENOUS | Status: AC
  Filled 2021-10-14: qty 50

## 2021-10-14 MED ORDER — PROPOFOL 10 MG/ML IV BOLUS
INTRAVENOUS | Status: DC | PRN
Start: 2021-10-14 — End: 2021-10-14
  Administered 2021-10-14: 70 mg via INTRAVENOUS

## 2021-10-14 MED ORDER — SODIUM CHLORIDE 0.9 % IV SOLN: INTRAVENOUS | Status: DC

## 2021-10-14 MED ORDER — PROPOFOL 500 MG/50ML IV EMUL
INTRAVENOUS | Status: DC | PRN
Start: 2021-10-14 — End: 2021-10-14
  Administered 2021-10-14: 145 ug/kg/min via INTRAVENOUS

## 2021-10-14 NOTE — Op Note (Signed)
[facility] ?Gastroenterology ?Patient Name: Curtis Sherman ?Procedure Date: 10/14/2021 9:40 AM ?MRN: [id number] ?Account #: [id number] ?Date of Birth: 01/07/1956 ?Admit Type: Outpatient ?Age: 66 ?Room: [facility] ENDO ROOM 3 ?Gender: Male ?Note Status: Finalized ?Instrument Name: Colonoscope [id number] ?Procedure:             Colonoscopy ?Indications:           Surveillance: Personal history of adenomatous polyps  ?                       on last colonoscopy > 5 years ago, Family history of  ?                       colon cancer in a first-degree relative before age 74  ?                       years ?Providers:             Andrey Farmer MD, MD ?Referring MD:          Sofie Hartigan (Referring MD) ?Medicines:             Monitored Anesthesia Care ?Complications:         No immediate complications. ?Procedure:             Pre-Anesthesia Assessment: ?                       - Prior to the procedure, a History and Physical was  ?                       performed, and patient medications and allergies were  ?                       reviewed. The patient is competent. The risks and  ?                       benefits of the procedure and the sedation options and  ?                       risks were discussed with the patient. All questions  ?                       were answered and informed consent was obtained.  ?                       Patient identification and proposed procedure were  ?                       verified by the physician, the nurse, the  ?                       anesthesiologist, the anesthetist and the technician  ?                       in the endoscopy suite. Mental Status Examination:  ?                       alert and oriented. Airway Examination: normal  ?  oropharyngeal airway and neck mobility. Respiratory  ?                       Examination: clear to auscultation. CV Examination:  ?                       normal. Prophylactic Antibiotics: The patient does not  ?                        require prophylactic antibiotics. Prior  ?                       Anticoagulants: The patient has taken no previous  ?                       anticoagulant or antiplatelet agents. ASA Grade  ?                       Assessment: II - A patient with mild systemic disease.  ?                       After reviewing the risks and benefits, the patient  ?                       was deemed in satisfactory condition to undergo the  ?                       procedure. The anesthesia plan was to use monitored  ?                       anesthesia care (MAC). Immediately prior to  ?                       administration of medications, the patient was  ?                       re-assessed for adequacy to receive sedatives. The  ?                       heart rate, respiratory rate, oxygen saturations,  ?                       blood pressure, adequacy of pulmonary ventilation, and  ?                       response to care were monitored throughout the  ?                       procedure. The physical status of the patient was  ?                       re-assessed after the procedure. ?                       After obtaining informed consent, the colonoscope was  ?                       passed under direct vision. Throughout the procedure,  ?  the patient's blood pressure, pulse, and oxygen  ?                       saturations were monitored continuously. The  ?                       Colonoscope was introduced through the anus and  ?                       advanced to the the terminal ileum. The colonoscopy  ?                       was performed without difficulty. The patient  ?                       tolerated the procedure well. The quality of the bowel  ?                       preparation was good. ?Findings: ?     The perianal and digital rectal examinations were normal. ?     The terminal ileum appeared normal. ?     Multiple small-mouthed diverticula were found in the sigmoid colon. ?     Internal  hemorrhoids were found during retroflexion. The hemorrhoids  ?     were Grade I (internal hemorrhoids that do not prolapse). ?     The exam was otherwise without abnormality on direct and retroflexion  ?     views. ?Impression:            - The examined portion of the ileum was normal. ?                       - Diverticulosis in the sigmoid colon. ?                       - Internal hemorrhoids. ?                       - The examination was otherwise normal on direct and  ?                       retroflexion views. ?                       - No specimens collected. ?Recommendation:        - Discharge patient to home. ?                       - Resume previous diet. ?                       - Continue present medications. ?                       - Repeat colonoscopy in 5 years for surveillance. ?                       - Return to referring physician as previously  ?                       scheduled. ?Procedure Code(s):     --- Professional --- ?  G0105, Colorectal cancer screening; colonoscopy on  ?                       individual at high risk ?Diagnosis Code(s):     --- Professional --- ?                       Z86.010, Personal history of colonic polyps ?                       K64.0, First degree hemorrhoids ?                       Z80.0, Family history of malignant neoplasm of  ?                       digestive organs ?                       K57.30, Diverticulosis of large intestine without  ?                       perforation or abscess without bleeding ?CPT copyright 2019 American Medical Association. All rights reserved. ?The codes documented in this report are preliminary and upon coder review may  ?be revised to meet current compliance requirements. ?Andrey Farmer MD, MD ?10/14/2021 10:13:46 AM ?Number of Addenda: 0 ?Note Initiated On: 10/14/2021 9:40 AM ?Scope Withdrawal Time: 0 hours 10 minutes 11 seconds  ?Total Procedure Duration: 0 hours 13 minutes 16 seconds  ?Estimated Blood Loss:   Estimated blood loss: none. ?     [facility] ?

## 2021-10-14 NOTE — H&P (Signed)
Outpatient short stay form Pre-procedure ?10/14/2021  ?Regis Bill, MD ? ?Primary Physician: Marina Goodell, MD ? ?Reason for visit:  Surveillance ? ?History of present illness:   ? ?66 y/o gentleman with history of hypertension, DM II, and hyperlipidemia here for surveillance colonoscopy. No blood thinners. History of appendectomy and ventral hernia repair. Father with colon cancer in his 38's. ? ? ? ?Current Facility-Administered Medications:  ?  0.9 %  sodium chloride infusion, , Intravenous, Continuous, Lataysha Vohra, Rossie Muskrat, MD, Last Rate: 20 mL/hr at 10/14/21 0947, New Bag at 10/14/21 0947 ? ?Medications Prior to Admission  ?Medication Sig Dispense Refill Last Dose  ? amLODipine (NORVASC) 10 MG tablet Take 10 mg by mouth daily.   10/13/2021  ? aspirin EC 81 MG EC tablet Take 1 tablet (81 mg total) by mouth daily.   10/13/2021  ? ezetimibe (ZETIA) 10 MG tablet Take 10 mg by mouth daily.   10/13/2021  ? losartan (COZAAR) 50 MG tablet Take 75 mg by mouth daily.   10/14/2021 at 0630  ? metFORMIN (GLUCOPHAGE-XR) 500 MG 24 hr tablet Take 500 mg by mouth 2 (two) times daily.   10/13/2021  ? pantoprazole (PROTONIX) 40 MG tablet Take 40 mg by mouth daily.   10/13/2021  ? pioglitazone (ACTOS) 15 MG tablet Take 15 mg by mouth daily.   10/13/2021  ? pioglitazone (ACTOS) 30 MG tablet Take 30 mg by mouth daily.     ? rosuvastatin (CRESTOR) 40 MG tablet TAKE 1 TABLET BY MOUTH ONCE DAILY AT  6PM 30 tablet 1 10/13/2021  ? acetaminophen (TYLENOL) 325 MG tablet Take 2 tablets (650 mg total) by mouth every 6 (six) hours as needed for mild pain.     ? Lactobacillus Rhamnosus, GG, (CULTURELLE PO) Take by mouth daily.     ? metFORMIN (GLUCOPHAGE) 500 MG tablet Take 500 mg by mouth 2 (two) times daily with a meal.      ? metoprolol tartrate (LOPRESSOR) 25 MG tablet Hold this medication until followup with your outpatient doctor, because your heart rate has been low (40's) in the hospital even without this. (Patient not taking:  Reported on 10/14/2021) 30 tablet 1 Not Taking  ? ? ? ?Allergies  ?Allergen Reactions  ? Codeine Nausea Only  ? Lisinopril Cough  ? ? ? ?Past Medical History:  ?Diagnosis Date  ? Anginal pain (HCC)   ? Arthritis   ? right foot  ? Chronic back pain   ? Coronary artery disease   ? Diabetes mellitus without complication (HCC)   ? GERD (gastroesophageal reflux disease)   ? High cholesterol   ? Hypertension   ? Kidney stones   ? Peripheral vascular disease (HCC)   ? S/P insertion of iliac artery stent 08/02/2018  ? bilateral  ? S/P right coronary artery (RCA) stent placement 04/19/2018  ? x2    ? Sleep apnea   ? STEMI (ST elevation myocardial infarction) (HCC) 06/17/2017  ? Wears dentures   ? full upper, partial lower  ? ? ?Review of systems:  Otherwise negative.  ? ? ?Physical Exam ? ?Gen: Alert, oriented. Appears stated age.  ?HEENT: PERRLA. ?Lungs: No respiratory distress ?CV: RRR ?Abd: soft, benign, no masses ?Ext: No edema ? ? ? ?Planned procedures: Proceed with colonoscopy. The patient understands the nature of the planned procedure, indications, risks, alternatives and potential complications including but not limited to bleeding, infection, perforation, damage to internal organs and possible oversedation/side effects from anesthesia. The patient agrees  and gives consent to proceed.  ?Please refer to procedure notes for findings, recommendations and patient disposition/instructions.  ? ? ? ?Lesly Rubenstein, MD ?[facility] ? ? ? ?  ? ?

## 2021-10-14 NOTE — Interval H&P Note (Signed)
History and Physical Interval Note: ? ?10/14/2021 ?9:49 AM ? ?Curtis Sherman  has presented today for surgery, with the diagnosis of PH Colon Polyps ?FH colon Cancer.  The various methods of treatment have been discussed with the patient and family. After consideration of risks, benefits and other options for treatment, the patient has consented to  Procedure(s) with comments: ?COLONOSCOPY WITH PROPOFOL (N/A) - DM as a surgical intervention.  The patient's history has been reviewed, patient examined, no change in status, stable for surgery.  I have reviewed the patient's chart and labs.  Questions were answered to the patient's satisfaction.   ? ? ?Rossie Muskrat [name] ? ?Ok to proceed with colonoscopy ?

## 2021-10-14 NOTE — Anesthesia Preprocedure Evaluation (Signed)
Anesthesia Evaluation  ?Patient identified by MRN, date of birth, ID band ?Patient awake ? ? ? ?Reviewed: ?Allergy & Precautions, NPO status , Patient's Chart, lab work & pertinent test results ? ?History of Anesthesia Complications ?Negative for: history of anesthetic complications ? ?Airway ?Mallampati: III ? ?TM Distance: >3 FB ?Neck ROM: full ? ? ? Dental ? ?(+) Chipped, Poor Dentition, Missing ?  ?Pulmonary ?neg shortness of breath, sleep apnea , former smoker,  ?  ?Pulmonary exam normal ? ? ? ? ? ? ? Cardiovascular ?hypertension, (-) angina+ CAD, + Past MI and + Peripheral Vascular Disease  ?Normal cardiovascular exam ? ? ?  ?Neuro/Psych ?negative neurological ROS ? negative psych ROS  ? GI/Hepatic ?Neg liver ROS, GERD  Controlled,  ?Endo/Other  ?negative endocrine ROSdiabetes ? Renal/GU ?Renal disease  ?negative genitourinary ?  ?Musculoskeletal ? ? Abdominal ?  ?Peds ? Hematology ?negative hematology ROS ?(+)   ?Anesthesia Other Findings ?Past Medical History: ?No date: Anginal pain (HCC) ?No date: Arthritis ?    Comment:  right foot ?No date: Chronic back pain ?No date: Coronary artery disease ?No date: Diabetes mellitus without complication (HCC) ?No date: GERD (gastroesophageal reflux disease) ?No date: High cholesterol ?No date: Hypertension ?No date: Kidney stones ?No date: Peripheral vascular disease (HCC) ?08/02/2018: S/P insertion of iliac artery stent ?    Comment:  bilateral ?04/19/2018: S/P right coronary artery (RCA) stent placement ?    Comment:  x2   ?No date: Sleep apnea ?06/17/2017: STEMI (ST elevation myocardial infarction) (HCC) ?No date: Wears dentures ?    Comment:  full upper, partial lower ? ?Past Surgical History: ?No date: APPENDECTOMY ?No date: BACK SURGERY ?    Comment:  lumbar  x2 ?No date: bony fusion; Right ?    Comment:  foot ?04/19/2018: CARDIAC CATHETERIZATION ?    Comment:  2 stents ?06/06/2019: CATARACT EXTRACTION W/PHACO; Right ?     Comment:  Procedure: CATARACT EXTRACTION PHACO AND INTRAOCULAR  ?             LENS PLACEMENT (IOC) RIGHT DIABETIC, 1.06, 00:18.3;   ?             Surgeon: Nevada Crane, MD;  Location: [facility]  ?             [facility];  Service: Ophthalmology;  Laterality:  ?             Right;  Diabetic - oral meds ?06/27/2019: CATARACT EXTRACTION W/PHACO; Left ?    Comment:  Procedure: CATARACT EXTRACTION PHACO AND INTRAOCULAR  ?             LENS PLACEMENT (IOC) LEFT DIABETIC 1.84,  00:23.0;   ?             Surgeon: Nevada Crane, MD;  Location: [facility]  ?             [facility];  Service: Ophthalmology;  Laterality: Left; ?             Diabetic - oral meds ?No date: COLONOSCOPY ?No date: COLONOSCOPY ?No date: COLONOSCOPY ?No date: COLONOSCOPY WITH ESOPHAGOGASTRODUODENOSCOPY (EGD) ?06/19/2017: CORONARY ARTERY BYPASS GRAFT; N/A ?    Comment:  Procedure: CORONARY ARTERY BYPASS GRAFTING (CABG) x 3  ?             WITH RIGHT ENDOVEIN HARVESTING;  Surgeon: Tyrone Sage,  ?             Gwenith Daily, MD;  Location: [facility];  Service: Open Heart  ?             Surgery;  Laterality: N/A; ?06/17/2017: CORONARY/GRAFT ACUTE MI REVASCULARIZATION; N/A ?    Comment:  Procedure: Coronary Angiography;  Surgeon: Paraschos,  ?             Lyn Hollingshead, MD;  Location: [facility];  Service: ?             Cardiovascular;  Laterality: N/A; ?No date: ESOPHAGOGASTRODUODENOSCOPY ?12/25/2014: ESOPHAGOGASTRODUODENOSCOPY (EGD) WITH PROPOFOL; N/A ?    Comment:  Procedure: ESOPHAGOGASTRODUODENOSCOPY (EGD) WITH  ?             PROPOFOL;  Surgeon: Scot Jun, MD;  Location: [facility] ?             ENDOSCOPY;  Service: Endoscopy;  Laterality: N/A; ?No date: FRACTURE SURGERY ?No date: HERNIA REPAIR ?06/17/2017: LEFT HEART CATH AND CORONARY ANGIOGRAPHY; N/A ?    Comment:  Procedure: LEFT HEART CATH;  Surgeon: Paraschos,  ?             Lyn Hollingshead, MD;  Location: [facility];  Service: ?             Cardiovascular;  Laterality: N/A; ?No date: NISSEN  FUNDOPLICATION ? ?BMI   ? Body Mass Index: 26.72 kg/m?  ?  ? ? Reproductive/Obstetrics ?negative OB ROS ? ?  ? ? ? ? ? ? ? ? ? ? ? ? ? ?  ?  ? ? ? ? ? ? ? ? ?Anesthesia Physical ?Anesthesia Plan ? ?ASA: 3 ? ?Anesthesia Plan: General  ? ?Post-op Pain Management:   ? ?Induction: Intravenous ? ?PONV Risk Score and Plan: Propofol infusion and TIVA ? ?Airway Management Planned: Natural Airway and Nasal Cannula ? ?Additional Equipment:  ? ?Intra-op Plan:  ? ?Post-operative Plan:  ? ?Informed Consent: I have reviewed the patients History and Physical, chart, labs and discussed the procedure including the risks, benefits and alternatives for the proposed anesthesia with the patient or authorized representative who has indicated his/her understanding and acceptance.  ? ? ? ?Dental Advisory Given ? ?Plan Discussed with: Anesthesiologist, CRNA and Surgeon ? ?Anesthesia Plan Comments: (Patient consented for risks of anesthesia including but not limited to:  ?- adverse reactions to medications ?- risk of airway placement if required ?- damage to eyes, teeth, lips or other oral mucosa ?- nerve damage due to positioning  ?- sore throat or hoarseness ?- Damage to heart, brain, nerves, lungs, other parts of body or loss of life ? ?Patient voiced understanding.)  ? ? ? ? ? ? ?Anesthesia Quick Evaluation ? ?

## 2021-10-14 NOTE — Transfer of Care (Signed)
Immediate Anesthesia Transfer of Care Note ? ?Patient: Curtis Sherman ? ?Procedure(s) Performed: COLONOSCOPY WITH PROPOFOL ? ?Patient Location: PACU ? ?Anesthesia Type:General ? ?Level of Consciousness: awake and alert  ? ?Airway & Oxygen Therapy: Patient Spontanous Breathing and Patient connected to nasal cannula oxygen ? ?Post-op Assessment: Report given to RN and Post -op Vital signs reviewed and stable ? ?Post vital signs: Reviewed and stable ? ?Last Vitals:  ?Vitals Value Taken Time  ?BP 89/50 10/14/21 1012  ?Temp 35.8 ?C 10/14/21 1012  ?Pulse 64 10/14/21 1012  ?Resp 17 10/14/21 1012  ?SpO2 93 % 10/14/21 1012  ? ? ?Last Pain:  ?Vitals:  ? 10/14/21 1016  ?TempSrc:   ?PainSc: 0-No pain  ?   ? ?  ? ?Complications: No notable events documented. ?

## 2021-10-14 NOTE — Anesthesia Postprocedure Evaluation (Signed)
Anesthesia Post Note ? ?Patient: Curtis Sherman ? ?Procedure(s) Performed: COLONOSCOPY WITH PROPOFOL ? ?Patient location during evaluation: Endoscopy ?Anesthesia Type: General ?Level of consciousness: awake and alert ?Pain management: pain level controlled ?Vital Signs Assessment: post-procedure vital signs reviewed and stable ?Respiratory status: spontaneous breathing, nonlabored ventilation, respiratory function stable and patient connected to nasal cannula oxygen ?Cardiovascular status: blood pressure returned to baseline and stable ?Postop Assessment: no apparent nausea or vomiting ?Anesthetic complications: no ? ? ?No notable events documented. ? ? ?Last Vitals:  ?Vitals:  ? 10/14/21 1034 10/14/21 1049  ?BP: 120/75 (!) 142/71  ?Pulse: (!) 55 (!) 51  ?Resp: 14   ?Temp:    ?SpO2: 97% 100%  ?  ?Last Pain:  ?Vitals:  ? 10/14/21 1049  ?TempSrc:   ?PainSc: 0-No pain  ? ? ?  ?  ?  ?  ?  ?  ? ?Curtis Sherman ? ? ? ? ?

## 2021-10-15 ENCOUNTER — Encounter: Payer: Self-pay | Admitting: Gastroenterology

## 2021-10-23 ENCOUNTER — Ambulatory Visit: Admit: 2021-10-23 | Discharge: 2021-10-24 | Payer: MEDICARE

## 2021-10-23 DIAGNOSIS — I2510 Atherosclerotic heart disease of native coronary artery without angina pectoris: Principal | ICD-10-CM

## 2021-10-24 DIAGNOSIS — I10 Essential (primary) hypertension: Principal | ICD-10-CM

## 2021-10-24 DIAGNOSIS — E785 Hyperlipidemia, unspecified: Principal | ICD-10-CM

## 2021-10-24 DIAGNOSIS — I2510 Atherosclerotic heart disease of native coronary artery without angina pectoris: Principal | ICD-10-CM

## 2021-10-24 DIAGNOSIS — I209 Angina pectoris, unspecified: Principal | ICD-10-CM

## 2021-11-06 ENCOUNTER — Ambulatory Visit: Admit: 2021-11-06 | Discharge: 2021-11-06 | Payer: MEDICARE

## 2021-11-06 HISTORY — PX: CORONARY ANGIOPLASTY: SHX604

## 2021-11-06 MED ORDER — CLOPIDOGREL 75 MG TABLET
ORAL_TABLET | Freq: Every day | ORAL | 11 refills | 30 days | Status: CP
Start: 2021-11-06 — End: ?

## 2021-11-15 ENCOUNTER — Ambulatory Visit: Admit: 2021-11-15 | Discharge: 2021-11-15 | Payer: MEDICARE

## 2021-11-15 DIAGNOSIS — I5189 Other ill-defined heart diseases: Secondary | ICD-10-CM

## 2021-11-15 HISTORY — DX: Other ill-defined heart diseases: I51.89

## 2021-12-05 ENCOUNTER — Ambulatory Visit
Admit: 2021-12-05 | Discharge: 2021-12-06 | Payer: MEDICARE | Attending: Cardiovascular Disease | Primary: Cardiovascular Disease

## 2022-04-10 ENCOUNTER — Ambulatory Visit
Admit: 2022-04-10 | Discharge: 2022-04-11 | Payer: MEDICARE | Attending: Cardiovascular Disease | Primary: Cardiovascular Disease

## 2022-04-10 DIAGNOSIS — Z789 Other specified health status: Principal | ICD-10-CM

## 2022-04-10 DIAGNOSIS — I2510 Atherosclerotic heart disease of native coronary artery without angina pectoris: Principal | ICD-10-CM

## 2022-04-10 DIAGNOSIS — I771 Stricture of artery: Principal | ICD-10-CM

## 2022-04-10 DIAGNOSIS — E782 Mixed hyperlipidemia: Principal | ICD-10-CM

## 2022-04-10 DIAGNOSIS — I10 Essential (primary) hypertension: Principal | ICD-10-CM

## 2022-04-10 MED ORDER — EVOLOCUMAB 140 MG/ML SUBCUTANEOUS PEN INJECTOR
SUBCUTANEOUS | 13 refills | 0 days | Status: CP
Start: 2022-04-10 — End: ?

## 2022-04-14 DIAGNOSIS — I771 Stricture of artery: Principal | ICD-10-CM

## 2022-04-14 DIAGNOSIS — Z789 Other specified health status: Principal | ICD-10-CM

## 2022-04-14 DIAGNOSIS — I2510 Atherosclerotic heart disease of native coronary artery without angina pectoris: Principal | ICD-10-CM

## 2022-04-14 MED ORDER — PRALUENT PEN 75 MG/ML SUBCUTANEOUS PEN INJECTOR
SUBCUTANEOUS | 13 refills | 0 days | Status: CP
Start: 2022-04-14 — End: ?
  Filled 2022-04-23: qty 2, 28d supply, fill #0

## 2022-04-18 NOTE — Unmapped (Signed)
[facility] Specialty Medication Onboarding    Specialty Medication: PRALUENT PEN 75 mg/mL Pnij (alirocumab)  Prior Authorization: Approved   Financial Assistance: No - copay card or grant not available  Final Copay/Day Supply: $47 / 28    Insurance Restrictions: None     Notes to Pharmacist:     The triage team has completed the benefits investigation and has determined that the patient is able to fill this medication at [facility]. Please contact the patient to complete the onboarding or follow up with the prescribing physician as needed.

## 2022-04-21 MED ORDER — EMPTY CONTAINER
2 refills | 0 days
Start: 2022-04-21 — End: ?

## 2022-04-21 NOTE — Unmapped (Signed)
[facility]   Patient Onboarding/Medication Counseling    Dale Webb is a 66 y.o. male with CAD who I am counseling today on initiation of therapy.  I am speaking to the patient.    Was a Nurse, learning disability used for this call? No    Verified patient's date of birth / HIPAA.    Specialty medication(s) to be sent: General Specialty: Praluent      Non-specialty medications/supplies to be sent: sharps container kit      Medications not needed at this time: n/a         Praluent (alirocumab)    Medication & Administration     Dosage: Inject the contents of 1 pen (75mg ) under the skin every 2 weeks.    Administration: Inject under the skin of the thigh, abdomen or upper arm. Rotate sites with each injection.  Injection instructions - Pen:  Take 1 (or 2) pens out of the refrigerator and allow to stand at room temperature for 30 to 40 minutes  Check the pen(s) for the following:  Expiration date  Absence of damage or cracks  Medication is clear, colorless or pale yellow and free from particles  Choose your injection site and clean with alcohol wipe. Allow to air dry completely.  Pull the blue needle cap straight off and discard  Hold the pen in your palm with your thumb over the green button, but do not touch the green button yet.  Press the pen unto your skin at a 90 degree angle until the yellow safety cover disappears  Push the green button and immediately release. You will hear a ???click.??? This signifies that the injection has started.  Continue to hold the pen in place, maintaining pressure, until the entire window has turned yellow.  This may take up to 20 seconds.  You may hear a second ???click??? when the injection is complete.  Lift the pen straight off your skin and dispose of it in a sharps container  If there is blood at the injection site gently press a cotton ball or guaze to the site. Do not rub the injection site.    Adherence/Missed dose instructions: Administer a missed dose within 7 days and resume your normal schedule. If it has been more than 7 days and you inject every 2 weeks, skip the missed dose and resume your normal schedule..     Goals of Therapy     Lower cholesterol  Lower the risk of heart attack, stroke and unstable angina in people with heart disease    Side Effects & Monitoring Parameters   Injection site irritation  Flu-like symptoms  Nose or throat irritation    The following side effects should be reported to the provider:  Signs of an allergic reaction    Contraindications, Warnings, & Precautions     Contraindications: Serious hypersensitivity to alirocumab or any component of the formulation  Precautions: Hypersensitivity reactions    Drug/Food Interactions     Medication list reviewed in Epic. The patient was instructed to inform the care team before taking any new medications or supplements. No drug interactions identified.     Storage, Handling Precautions, & Disposal     Praluent should be stored in the refrigerator.   If necessary Praluent may be stored at room temperature, in the original carton, for no more than 30 days.  Place used devices into a sharps container for disposal      Current Medications (including OTC/herbals), Comorbidities and Allergies  Current Outpatient Medications   Medication Sig Dispense Refill    alirocumab (PRALUENT PEN) 75 mg/mL PnIj Inject the contents of one pen (75 mg) under the skin every fourteen (14) days. 2 mL 13    amLODIPine (NORVASC) 10 MG tablet Take 1 tablet by mouth once daily 90 tablet 3    aspirin 81 MG chewable tablet Chew 1 tablet (81 mg total) daily. 30 tablet 0    B complex C 11/calcium/dha/Q10 (BRAIN MIGHT-DHA-CO Q10 ORAL) Take 1 tablet by mouth in the morning. (Patient not taking: Reported on 04/10/2022)      clopidogreL (PLAVIX) 75 mg tablet Take 1 tablet (75 mg total) by mouth daily. 30 tablet 11    coffee xt-phosphatidyl serine (NEURIVA ORIGINAL) 100-100 mg cap Take 1 capsule by mouth in the morning. (Patient not taking: Reported on 04/10/2022)      cyclobenzaprine (FLEXERIL) 5 MG tablet Take 1 tablet (5 mg total) by mouth Three (3) times a day as needed for muscle spasms.      ezetimibe (ZETIA) 10 mg tablet Take 1 tablet by mouth once daily 90 tablet 3    Lactobacillus rhamnosus GG (CULTURELLE) 10 billion cell capsule Take 1 capsule by mouth daily.      losartan (COZAAR) 50 MG tablet Take 2 tablets (100 mg total) by mouth daily. 180 tablet 3    metFORMIN (GLUCOPHAGE) 500 MG tablet Take 1 tablet (500 mg total) by mouth in the morning and 1 tablet (500 mg total) in the evening. Take with meals. (Patient not taking: Reported on 04/10/2022)      oxyCODONE-acetaminophen (PERCOCET) 5-325 mg per tablet Take 1 tablet by mouth daily as needed.      pantoprazole (PROTONIX) 40 MG tablet Take 1 tablet (40 mg total) by mouth two (2) times a day.      pioglitazone (ACTOS) 30 MG tablet Take 1 tablet (30 mg total) by mouth daily.      rosuvastatin (CRESTOR) 40 MG tablet Take 1 tablet (40 mg total) by mouth nightly. (Patient not taking: Reported on 04/10/2022)       No current facility-administered medications for this visit.       Allergies   Allergen Reactions    Codeine Nausea Only       Patient Active Problem List   Diagnosis    Angina pectoris (CMS-HCC)    Stenosis of iliac artery, unspecified laterality (CMS-HCC)    Coronary artery disease involving native coronary artery of native heart without angina pectoris    Essential hypertension    Hyperlipidemia       Reviewed and up to date in Epic.    Appropriateness of Therapy     Acute infections noted within Epic:  No active infections  Patient reported infection: None    Is medication and dose appropriate based on diagnosis and infection status? Yes    Prescription has been clinically reviewed: Yes      Baseline Quality of Life Assessment      How many days over the past month did your CAD  keep you from your normal activities? For example, brushing your teeth or getting up in the morning. Patient declined to answer    Financial Information     Medication Assistance provided: Prior Authorization    Anticipated copay of $47 (28 days) reviewed with patient. Verified delivery address.    Delivery Information     Scheduled delivery date: 04/24/22    Expected start date: 04/24/22    Medication will  be delivered via UPS to the prescription address in [facility].  This shipment will not require a signature.      Explained the services we provide at [facility] and that each month we would call to set up refills.  Stressed importance of returning phone calls so that we could ensure they receive their medications in time each month.  Informed patient that we should be setting up refills 7-10 days prior to when they will run out of medication.  A pharmacist will reach out to perform a clinical assessment periodically.  Informed patient that a welcome packet, containing information about our pharmacy and other support services, a Notice of Privacy Practices, and a drug information handout will be sent.      The patient or caregiver noted above participated in the development of this care plan and knows that they can request review of or adjustments to the care plan at any time.      Patient or caregiver verbalized understanding of the above information as well as how to contact the pharmacy at [phone] option 4 with any questions/concerns.  The pharmacy is open Monday through Friday 8:30am-4:30pm.  A pharmacist is available 24/7 via pager to answer any clinical questions they may have.    Patient Specific Needs     Does the patient have any physical, cognitive, or cultural barriers? No    Does the patient have adequate living arrangements? (i.e. the ability to store and take their medication appropriately) Yes    Did you identify any home environmental safety or security hazards? No    Patient prefers to have medications discussed with  Patient     Is the patient or caregiver able to read and understand education materials at a high school level or above? Yes    Patient's primary language is  English     Is the patient high risk? No    SOCIAL DETERMINANTS OF HEALTH     At the [facility], we have learned that life circumstances - like trouble affording food, housing, utilities, or transportation can affect the health of many of our patients.   That is why we wanted to ask: are you currently experiencing any life circumstances that are negatively impacting your health and/or quality of life? Patient declined to answer    Social Determinants of Health     Financial Resource Strain: Not on file   Internet Connectivity: Not on file   Food Insecurity: Not on file   Tobacco Use: Medium Risk (04/10/2022)    Patient History     Smoking Tobacco Use: Former     Smokeless Tobacco Use: Never     Passive Exposure: Not on file   Housing/Utilities: Not on file   Alcohol Use: Not on file   Transportation Needs: Not on file   Substance Use: Not on file   Health Literacy: Not on file   Physical Activity: Not on file   Interpersonal Safety: Not on file   Stress: Not on file   Intimate Partner Violence: Not on file   Depression: Not on file   Social Connections: Not on file       Would you be willing to receive help with any of the needs that you have identified today? Not applicable       Camillo Flaming, PharmD  [facility] Specialty Pharmacist

## 2022-04-23 MED FILL — EMPTY CONTAINER: 120 days supply | Qty: 1 | Fill #0

## 2022-05-19 ENCOUNTER — Ambulatory Visit: Admit: 2022-05-19 | Discharge: 2022-05-20 | Payer: MEDICARE

## 2022-05-19 ENCOUNTER — Ambulatory Visit
Admit: 2022-05-19 | Discharge: 2022-05-20 | Payer: MEDICARE | Attending: Foot and Ankle Surgery | Primary: Foot and Ankle Surgery

## 2022-05-19 NOTE — Unmapped (Signed)
Custom gauntlet ankle foot orthosis (AFO)  Brand example: [place]

## 2022-05-19 NOTE — Unmapped (Signed)
[facility] Refill Coordination Note    Specialty Lite Medication(s) to be Shipped:   General Specialty: Praluent    Other medication(s) to be shipped: No additional medications requested for fill at this time     Dale Webb, DOB: 1955-08-02  Phone: [phone] (home)       All above HIPAA information was verified with patient.     Was a Nurse, learning disability used for this call? No    Changes to medications: Juniel reports no changes at this time.  Changes to insurance: No      REFERRAL TO PHARMACIST     Referral to the pharmacist: Not needed      [facility]     Shipping address confirmed in Epic.     Delivery Scheduled: Yes, Expected medication delivery date: 05/21/22.     Medication will be delivered via UPS to the prescription address in Epic [place].    Wyatt Mage M Elisabeth Cara   [facility] Specialty Technician

## 2022-05-19 NOTE — Unmapped (Signed)
[facility]  Encounter Provider: [name], MD  Date of Service: 05/19/2022 Last encounter Orthopaedics: Visit date not found   Last encounter this provider: Visit date not found      Notes:      MDM (Two highest determine E&M code)   Problems 1 or more chronic illnesses with exacerbation, progression, or side effects of treatment ([address], [id number])   Data Independent interpretation of imaging (99204/99214)   Risk Low risk of morbidity from additional diagnostic testing or treatment (99203/99213)       Primary Care Provider: [name]  Referring Provider: Referred Self    ICD-10-CM   1. 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    Physical Function CAT Score: (not recorded)  Pain Interference CAT Score: (not recorded)  Depression CAT Score: (not recorded)  Sleep CAT Score: (not recorded)  [name]     Dale Webb is a 66 y.o. male   ASSESSMENT   Right foot subtalar arthritis  Right foot talonavicular arthritis        PLAN:       We had a thorough discussion with the patient in clinic today regarding his treatment options for his subtalar and talonavicular arthritis.  We discussed treatment with either a custom gauntlet AFO [facility] style) versus surgery which that would likely involve a fusion.  The patient is interested in proceeding with surgery.  Prior to discussing surgical options further, we have ordered a CT scan for preoperative planning as well as arterial PVLs given his history of lower extremity stents.  He will likely need cardiology clearance versus [facility] if he ultimately decides to proceed with surgery given his history of coronary stents most recently May 2023.      Scheduling Notes:  Scheduled With [name], MD: 06/13/2022  With Orthopaedics: 06/13/2022    - Xrays next visit in same ortho team: none       Requested Prescriptions      No prescriptions requested or ordered in this encounter      Orders Placed This Encounter   Procedures    XR Ankle Arthritis Right       History:  Chief Complaint: Right foot/ankle pain  Chief Complaint   Patient presents with    Right Foot - Pain     -Previous surgeries in  1981 and 1982 after a 37 foot fall  -pain has gotten far worse and feels like knife in it when walking       HPI:  66 y.o. male who presents to clinic today for evaluation of right foot/ankle pain.  The patient reports that he has a history of a 37 foot fall back in 1981 where he sustained fractures of his right foot.  He reports having multiple reconstructive surgeries with Dr. Levada Schilling totaling approximately 4.  Around the time of his initial surgeries he also had several corticosteroid injections into his right foot.  Since then, he reports that he is overall been doing okay in regards to his right foot until the last several months.  Now he has worsening pain in his right foot that is exacerbated with activity.  The pain is located along the dorsal aspect of his foot and extends laterally to just below his ankle.  He uses off-the-shelf shoe inserts.  He has never tried any custom braces or shoe inserts.  He has not had  any recent steroid injections.  He is currently retired.  He does have a history of coronary artery disease with multiple stents in his heart as well as stents in his bilateral lower extremities.  He is currently on aspirin and clopidogrel for blood thinners.  He does have diabetes as well.  He reports that his last hemoglobin A1c was 6.6.  He does not currently smoke.  He is very active and enjoys fishing, playing golf, and Holiday [employer].       Medical History Past Medical History:   Diagnosis Date    High cholesterol     Hypertension     Pre-diabetes       Surgical History Past Surgical History:   Procedure Laterality Date    APPENDECTOMY      back surgery      CARDIAC CATHETERIZATION      CARDIOVASCULAR STRESS TEST      CORONARY ARTERY BYPASS GRAFT      FOOT SURGERY      HERNIA REPAIR      PR CATH PLACE/CORON ANGIO, IMG SUPER/INTERP,W LEFT HEART VENTRICULOGRAPHY N/A 04/19/2018    Procedure: Left Heart Catheterization W Intervention;  Surgeon: Alvira Philips, MD;  Location: [facility] CATH;  Service: Cardiology    PR CATH PLACE/CORON ANGIO, IMG SUPER/INTERP,W LEFT HEART VENTRICULOGRAPHY N/A 11/06/2021    Procedure: Left Heart Catheterization;  Surgeon: Alvira Philips, MD;  Location: [facility] CATH;  Service: Cardiology    PR REVASCULARIZE FEM/POP ARTERY,ANGIOPLASTY/STENT N/A 08/02/2018    Procedure: Peripheral Angiography W Intervetion;  Surgeon: Alvira Philips, MD;  Location: [facility] CATH;  Service: Cardiology      Allergies Codeine   Medications He has a current medication list which includes the following prescription(s): cyclobenzaprine, oxycodone-acetaminophen, praluent pen, amlodipine, aspirin, b complex c 11/calcium/dha/q10, clopidogrel, neuriva original, cyclobenzaprine, empty container, ezetimibe, lactobacillus rhamnosus gg, losartan, metformin, oxycodone-acetaminophen, pantoprazole, pioglitazone, and rosuvastatin.   Family History His family history is not on file.   Social History He reports that he has quit smoking. His smoking use included cigars and cigarettes. He has never used smokeless tobacco. 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   Estimated body mass index is 27.18 kg/m?? as calculated from the following:    Height as of 11/06/21: 182.9 cm (6').    Weight as of 04/10/22: 90.9 kg (200 lb 6.4 oz).     Musculoskeletal   Right foot:  Multiple healed surgical incisions.  Prominent osteophyte over talonavicular joint.  Pain over sinus Tarsi.  Limited hindfoot motion.  Short arc of motion in ankle.  Able to achieve a plantigrade foot.       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  No results found for: A1C    No results found for: VITD    Imaging  Orders Placed This Encounter   Procedures    XR Ankle Arthritis Right     Radiology studies were ordered and personally reviewed and interpreted by the encounter provider today..  Severe talonavicular arthritis with likely moderate subtalar and mild to moderate calcaneocuboid arthritis.  Also has arthritis present at his navicular C1 articulation.       DME ORDER:  Dx:  ,

## 2022-05-20 MED FILL — PRALUENT PEN 75 MG/ML SUBCUTANEOUS PEN INJECTOR: SUBCUTANEOUS | 28 days supply | Qty: 2 | Fill #1

## 2022-05-21 NOTE — Unmapped (Signed)
Attending Attestation:  I saw and evaluated the patient, participating in the key portions of the service.     I determined the assessment and plan of care for the patient.  I reviewed and agree with the documented findings and plan in the resident's note above.  --Beverely Pace [name], MD  May 21, 2022 11:25 AM

## 2022-06-04 ENCOUNTER — Ambulatory Visit: Admit: 2022-06-04 | Discharge: 2022-06-05 | Payer: MEDICARE

## 2022-06-08 ENCOUNTER — Ambulatory Visit: Admit: 2022-06-08 | Discharge: 2022-06-09 | Payer: MEDICARE

## 2022-06-13 ENCOUNTER — Ambulatory Visit: Admit: 2022-06-13 | Discharge: 2022-06-14 | Payer: MEDICARE

## 2022-06-13 ENCOUNTER — Ambulatory Visit
Admit: 2022-06-13 | Discharge: 2022-06-14 | Payer: MEDICARE | Attending: Foot and Ankle Surgery | Primary: Foot and Ankle Surgery

## 2022-06-13 DIAGNOSIS — E785 Hyperlipidemia, unspecified: Principal | ICD-10-CM

## 2022-06-13 DIAGNOSIS — I2510 Atherosclerotic heart disease of native coronary artery without angina pectoris: Principal | ICD-10-CM

## 2022-06-13 DIAGNOSIS — Z789 Other specified health status: Principal | ICD-10-CM

## 2022-06-13 DIAGNOSIS — M19279 Secondary osteoarthritis, unspecified ankle and foot: Principal | ICD-10-CM

## 2022-06-13 DIAGNOSIS — M1990 Unspecified osteoarthritis, unspecified site: Principal | ICD-10-CM

## 2022-06-13 MED ORDER — REPATHA SURECLICK 140 MG/ML SUBCUTANEOUS PEN INJECTOR
SUBCUTANEOUS | 1 refills | 0 days | Status: CP
Start: 2022-06-13 — End: ?
  Filled 2022-07-08: qty 2, 28d supply, fill #0

## 2022-06-13 NOTE — Unmapped (Signed)
Name:  Dale Webb    MR#:  [id number]    DOB: [birth year]    Location: [facility]              [facility]  REQUEST AND CONSENT FOR PROCEDURE  HIM [phone]     I authorize Dr. ______Joshua Tennant__________________     and/or associates and assistants of his/her choice at the [facility] (referred to herein as facility) to perform the following procedures:         Right  Foot fusion of three joints: subtalar joint, talonavicular joint, and medial cuneiform-navicular joint     I understand that surgical assistants and/or residents may perform selected tasks under the supervision of my attending surgeon(s). These tasks may include (if applicable): opening and closing a surgical site; dissecting tissue; removing tissue, blood or body fluids; injecting medications; harvesting grafts, transplanting tissue; administering anesthesia; implanting devices; inserting/removing/operating an endoscope for diagnosis or treatment; and placing invasive lines. At the time of the procedure(s), the attending physician will determine the extent of participation by the surgical assistants and/or residents depending on: (1) the complexity of the procedure(s); (2) my unique circumstances as the patient; and (3) the surgical assistants' or residents' training and experience.     I request that necessary and appropriate anesthesia and medications be given to me.      I understand that, during the operation or procedure(s), it is possible for something unexpected to happen that may require another or different procedure(s) to be performed on me. In that situation, I authorize my above-named health care providers or providers identified as necessary by my surgical team to do what is medically necessary and appropriate for me.     I have discussed with my health care provider the following issues, as appropriate to my care: [initial one]:  _____ Authorization for Blood Products: I authorize medically necessary blood and blood products be given to me before, during or after the operation or procedure, as determined by my heath care provider;    _____ Refusal to Authorize Blood Products: : I do NOT authorize blood or blood products be given to me. (The patient or his/her guardian MUST also complete the facility's form for refusal of blood products.)     I have had an opportunity to ask questions, have had those questions answered, and have received sufficient information so that I have a general understanding of:  my medical condition,  the nature and benefits of the operation or procedure(s),  the usual and most frequent risks of the operation or procedure(s),  the risks and benefits of the alternative treatment(s), and  the prognosis of my condition with and without the operation or procedure(s).     I am aware that the practice of medicine (including surgery) is not an exact science, and no one has made any guarantees about the results of my procedure(s).        I understand this procedure(s) may result in the use of a human tissue implant, non-human implant or collagen received from a facility registered with the U[place] Food and Drug Administration. Risks with implanted tissue include infection from bacteria or viruses which include but may not be limited to HIV and/or the hepatitis viruses.     I give permission for employees, agents, or independent contractors of the facility to do the following, as long as any action they take is consistent with policies and laws that protect my rights:  take photographs or make videos or drawings of me for permissible treatment, payment, or health care operations purposes (which may include quality assessment, education, and training), and to use or disclose such photographs, videos or drawings consistent with these purposes;  examine and dispose of any tissue, blood, or body parts that may be removed during the operation or procedure(s) or use such tissue, blood, or body parts removed during the procedure(s) for education or research; and  for the purposes of advancing healthcare education, I give consent for observers authorized by the facility to be present during the procedure(s).     For women of childbearing age: I understand there may be the potential need for diagnostic x-ray(s) during my procedure. Exposure to x-ray may cause serious injury to the unborn fetus. Large x-ray exposures to the unborn fetus have been known to cause birth defects and abortion. Doses from diagnostic x-ray are not considered ???large???, but there is a potential risk of serious injury to the unborn fetus. I understand and have no further questions.     Based on my discussion with my health care provider and the information that I have received, I give my consent to the procedure(s). I confirm that I have read this form, or that it was read to me, that all blank spaces were filled in as appropriate, and all sections that I do not agree with were crossed out and initialed before I signed below.           ________________________________________________                     12/15/23_                   8:54 AM  Signature of Patient (or person authorized to sign for patient)                                    Date                                    Time           ________________________________  Relationship to Patient (if applicable)        WITNESS CERTIFICATION     The patient (or person authorized to sign for the patient) has answered yes to all of the following questions:  Did a health care provider explain the operation or procedure to you?  Did a health care provider explain that selected tasks may be performed by assistant(s)/resident(s)?  Did a health care provider explain alternative procedures and treatments and their risks and benefits?  Have you given your consent for the operation or procedure(s)?  Have all of your questions about the procedure(s) been answered? ____________________________                _________________________        12/15/23_       8:54 AM  Witness Signature                                                           Printed Name  Date                       Time

## 2022-06-13 NOTE — Unmapped (Signed)
Telephone call to patient to schedule appointment- patient in agreement with date, time, and place.    PAT Eval 06/18/2022 @ 9AM

## 2022-06-13 NOTE — Unmapped (Signed)
Foot and Ankle Division  Orbie Hurst, MD  Date of Service: 06/13/2022    SURGERY INSTRUCTIONS AND INFORMATION    Patient Appointments: [phone]  Clinic Team: [phone] (medical issues)  Alise RN, CMSRN.    Jillyn Hidden, C-PED.    Wallis Bamberg, NP-C  Secretary: Luan Moore [phone] (administrative issues)  Surgery Scheduling: Tammy Sours [phone]  Financial Counselor: [phone]  Fax: [phone]  Interpreter Voicemail: [phone]      Surgery Center Address:   See in box below:    Procedure:   Right  Foot fusion of three joints: subtalar joint, talonavicular joint, and medial cuneiform-navicular joint  Planned Date:   07/23/2022  Medical/Anesthesia concerns to address prior to surgery:   Open heart surgery CABG x 4; Heart catheterization May 2023 with angioplasty - will send to precare  Anesthesia:   General anesthesia with regional anesthesia block  [facility] [address], [place], [place]  Disposition:   Outpatient      To have a safe and successful surgery and recovery, it is important that you have:  Adequate help to get into and out of your home  A safe place to eat and sleep  Access to a bathroom  Any necessary medical equipment such as a walker or shower chair  Access to food, medications, and transportation to appointments    Please let a team member know as soon as possible if you have concerns regarding any of these issues.    If your provider has ordered pre-operative testing such as lab work, imaging studies, or vascular studies, please let us know if you have not been contacted to schedule the tests within 3 days of your clinic visit.     We want to ensure all your questions are answered prior to surgery and you feel as prepared as possible for your procedure. If questions arise, please reach out to your care team via MyChart or phone [phone].    -----------------------------------------------------------------------------------------------------------------------------    THINKING ABOUT SURGERY    Thinking about Goals  In general, the goal in orthopaedic surgical treatment is to improve your physical function and quality of life.      Thinking about Likelihood of Success  No surgical treatment can be a 100% guarantee for success.  Most treatments have good likelihood of success in the range of 85-90%.    Thinking about Alternatives  All physical ailments have nonoperative alternatives for treatment.  Some of these may be curative, some are temporary,  and some are not desirable.  We generally try nonoperative measures prior to discussing surgeries.  In situations where clear benefit exists for surgery as a first step (for example, a severely displaced fracture that could not be treated well in a cast), we would discuss surgery as an early treatment option.  Alternatives for this surgery: custom bracing; steroid injections could relieve symptoms for months at a time    Thinking about Risks  All surgeries, even minor procedures in the office, have risks.  Some of the main risks are infection, bleeding, neurovascular damage, and risks of undergoing anesthesia, including death in rare cases.  Amputation of toes or the leg can exist as a rare complication of elective foot and ankle surgery as well.  The careful use antibiotics, anesthesia, and surgical techniques help to avoid these risks.  See the methods for decreasing the risk of blood clots below.  For most people undergoing foot and ankle surgery these risks are less  than 1%.  Additional specific risks for this surgery: adjacent arthritis in other joints in the future, nerve injury/numbness, recurrence, wound healing issues, swelling for 6-9 months, symptomatic hardware     The risks of delaying I specifically discussed were: continued pain, otherwise none    The patient has had an opportunity to ask questions, and these questions were answered to satisfaction.  The patient expressed understanding.  Verbal consent obtained, plan for day of surgery signed verification: Yes.    Thinking about Costs  You should discuss the projected costs to you for your potential surgical procedure with your medical insurance company   Your responsibility for your health insurance deductible is an initial monetary figure to consider.  A bill for most types of surgery in an inpatient or outpatient setting can range from $10,000 to upwards of $100,000, with more expensive bills generally involving longer inpatient stays and expensive surgical tools and equipment.  These costs involve many components including hospital fees, anesthesia fees, and implant and equipment costs.  Surgeon fees and reimbursement are always a small portion of the total bill.  Our financial counselor is available to address financial questions and concerns at [phone].      INFORMATION ABOUT PLANNING FOR YOUR SURGERY    THE WEEK BEFORE YOUR SURGERY  You will bleed less during surgery if you STOP taking anti-inflammatory medications at least one week before surgery.  These include:  ASPIRIN,   IBUPROFEN (Motrin, Advil),   NAPROXYN (Naprosyn),   NAPROXEN SODIUM (Aleve),   INDOMETHACIN (Indocin),   CELEBREX and   several other prescription anti-inflammatories commonly prescribed for arthritis or other musculoskeletal conditions.   Discontinue FISH OIL supplements as well.    Ask your prescribing physician before discontinuing any medication.    Surgery Date  Your surgery will be SCHEDULED by our surgery scheduling office within 2-3 business days. They will contact you regarding your surgery date.   If you have not received a phone call within 2 business days, please contact our surgery scheduling office at [phone].   If you need to CANCEL your surgery for any reason please call the scheduling office as soon as possible.    Surgical Time  A preoperative nurse will call you between 11:00am -3:00pm on the last business day before your scheduled surgery. They will inform you of your scheduled surgery time as well as the time you should arrive for surgery.  If you have not been called by 3:00pm the day before your surgery, you may call  [facility] at [phone] or [facility] Phone Nurse at [phone] or [facility] Phone Nurse at [phone].   If you will not be staying at your home address the night before surgery, please call [facility] or the PreOP Nurse to let them know where you can reached.       The Night Before Your Surgery    DO NOT EAT AFTER MIDNIGHT.    If you are diabetic, check with your primary care physician for special instructions concerning your insulin dosage prior to surgery.    CLEAR liquids (water and black coffee) are generally OK in small amounts the morning of your surgery (no milk, no cream, no juices that you cannot see through).  Take your usual medications as prescribed, unless otherwise instructed.      These limitations are for your safety with anesthesia.      The Morning of Your Surgery  You should wear loose fitting clothes that  will not be restrictive to your surgical site.   Do not wear any piercings, nail polish, make-up, or metal hair clips on the day of surgery. Contact lenses, glasses, and dentures must be removed before surgery. Please make sure to bring a case to store these items in during surgery.  Please leave jewelry, credit cards, cash, and other valuables at home.  You may take your usual medications as prescribed with a small sip of water, unless otherwise instructed. Do NOT drink a full glass of water.    A RESPONSIBLE ADULT (OVER 18) MUST ACCOMPANY YOU ON THE DAY OF SURGERY AND BE AVAILABLE THROUGHOUT YOUR PROCEDURE IN THE EVENT QUESTIONS OR ISSUES ARISE. THEY ALSO MUST BE AVAILABLE TO TAKE YOU HOME FOLLOWING YOUR PROCEDURE AS IT WILL NOT BE SAFE FOR YOU TO DRIVE OR TAKE PUBLIC TRANSPORTATION ALONE.        Blood Clot Prevention Protocol  For all wounds, injuries, and surgeries in which your weightbearing is limited, to prevent blood clots:  You should elevate your foot above the level of your heart to prevent swelling when you are not up and about.  You should get up and ambulate (with crutches, walker, or assistance as needed or instructed) at least once per hour during daytime hours.  For leg, ankle, and midfoot injuries and surgeries: You should do gentle foot and toe squeezing exercises (flexing the toes up and down; foot crunches).  For forefoot or toe injuries and surgeries, you should move your ankle up, down and around.  You should take an 81 mg aspirin daily after a surgery or injury until you are weightbearing without difficulty or allowed to bear weight.  If you have a history of blood clotting disorder or history of previous use of an anticoagulant (like Lovenox, Coumadin, or other similar medications), make sure you notify your surgeon or a member of the care team to see if this would be indicated for your current care and prevention of blood clots.    Cast Care/Post-Operative Splint Instructions  If you will have a cast, please read the following.  Unless instructed otherwise, you should be non-weightbearing on your cast. Weightbearing casts require a use of a special shoe to protect the bottom of the cast.  Keep the cast clean, dry and intact.  Elevate your foot ABOVE the level of your heart to prevent swelling within your cast.  You should do gentle range of motion exercises and foot pump exercises (repetitions of 10 every 30 minutes) to help prevent blood clots and keep your blood moving within your foot, even though you have restricted motion within the cast (your muscles with help pump the blood through your foot and ankle).    POST-OPERATIVE MEDICATIONS  We will manage your pain post-operatively, with a combination of local or regional anesthesia (in the first 10-48 hours after surgery), and oral medications (both narcotic, for a limited time period, and non-narcotic).      Pain is expected following surgery. We use a multiple medications for pain management to minimize discomfort and side effects of pain medications. We recommend taking pain medication as prescribed to manage the expected increase in pain that occurs when the surgical nerve block wears off.     The typical prescription regimen is as follows:  Tylenol (acetaminophen) 650 mg tablet by mouth every 8 hours scheduled  Ibuprofen  NARCOTICS:  (usually ~20  tablets per prescription)  Oxycodone 5 mg tablet by mouth every 6 hours as needed for pain >>  if needed,   Convert to [employer] 5/325 (5mg  Hydrocodone, 325 mg acetaminophen) by mouth every 8-12 hours at first post-operative visit (2 weeks)>> if needed   Gabapentin (Neurontin) 100 mg by mouth 3 x/day, for 3 days only (9 tablets total)  Phenergan (anti-nausea)  Colace (stool softener, to be used while taking narcotics)  Vitamin D  Calcium  Vitamin C    Extra or unused medication can be discarded as described here: [email or web address]      Recovery  Timeline of weightbearing limitations*:   Non weightbearing: 6-10 weeks in casts, then advance in boot  Follow-up Appointment Plan:   Week Provider Clinic vs. Phone Plan   2 Dr[name] Sutures out, cast   6 Dr[name] Cast, x-rays   10 Dr. Concha Norway, x-rays           Durable medical equipment (DME) expectations: walker; to be delivered on day of surgery; also may have crutches, and knee scooter       FOLLOW-UP APPOINTMENTS  Your return appointment will be scheduled between 5 and 15 days after surgery depending on your surgery type.  Our scheduling office will schedule your follow-up appointments when scheduling your surgery. (In the box above you will note the set follow-up plan prior to scheduling your surgery).    If you need to reschedule this appointment or have questions as to the time of your appointment please call [phone].    WORK STATUS/PAPERWORK/FMLA  All paperwork should be faxed to our administrative assistant at the number at the top.  We require a 5 business day MINIMUM to complete all paperwork. (This timeline could take longer if your provider is out of the office).  If you require a WORK NOTE for restrictions or limitations please make sure your work duties are discussed at each visit and a work note is provided with any changes.    Workers Comp should send a FAXED WRITTEN request to the fax number at the top. Please provide your workers compensation agent with this number.  If you have questions regarding your paperwork please contact [phone].    IF YOU HAVE QUESTIONS or CONCERNS  During normal business hours (8am-4pm):  Medical Issues: Call our clinic team at [phone]   Administrative Issues: Call [phone]    During nights/weekends (after 5pm and Saturday/Sunday),   Call [facility] at [phone] and ask for the on-call orthopaedic surgery resident.    NON-EMERGENCY Questions: You can also send a message on MyChart message to your provider.  These messages are received by our clinic team and will be discussed with your provider.  MyChart messages should be non-emergent questions as they are only answered Monday-Friday (8am-4pm).     MyChart Login/Sign-up: [email or web address]

## 2022-06-13 NOTE — Unmapped (Signed)
Orthopaedic Foot and Ankle Division  Encounter Provider: [name], MD  Date of Service: 06/13/2022 Last encounter Orthopaedics: 05/19/2022   Last encounter this provider: 05/19/2022      Notes:      MDM (Two highest determine E&M code)   Problems 1 or more chronic illnesses with exacerbation, progression, or side effects of treatment ([address], [address])   Data Independent interpretation of imaging (99204/99214)   Risk ------Decision regarding elective major surgery with identified patient or procedure risk factors (99205/99215)       Primary Care Provider: [name]  Referring Provider: [name]    ICD-10-CM   1. 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 Arthritis of right midfoot  M19.071    Orthopaedic notes: No specialty comments available.    Physical Function CAT Score: (not recorded)  Pain Interference CAT Score: (not recorded)  Depression CAT Score: (not recorded)  Sleep CAT Score: (not recorded)  [name]     Dale Webb is a 66 y.o. 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  Given the identifiable pathology and the patient's desire to proceed, I offered a surgical plan as outlined below. The usual discussion involved that of goals, likelihood of success, alternatives, risks, and costs, also given on preoperative information.  Physical risks of operative treatment including infection, bleeding, neurovascular damage, amputation, and risks undergoing anesthesia were discussed and/or given as information in the preoperative information.  We specifically addressed the risks as outline in Patient Instructions.  Benefits also discussed.  Verbal consent for the procedure was given by the patient.      Procedure:   Right  Foot fusion of three joints: subtalar joint, talonavicular joint, and medial cuneiform-navicular joint  Planned Date:   07/23/2022  Medical/Anesthesia concerns to address prior to surgery:   Open heart surgery CABG x 4; Heart catheterization May 2023 with angioplasty - will send to precare  Anesthesia:   General anesthesia with regional anesthesia block  [facility] [address], [place], [place]  Disposition:   Outpatient     Position: supine            Scheduling Notes:  Scheduled With [name], MD: Visit date not found  With Orthopaedics: Visit date not found    - Xrays next visit in same ortho team: none       Requested Prescriptions      No prescriptions requested or ordered in this encounter      No orders of the defined types were placed in this encounter.      History:  Chief Complaint: right foot pain  No chief complaint on file.    HPI:  66 y.o. male who who is here today follow-up for CT and PVL; does not have interest in custom bracing; stopped smoking 2015.       Medical History Past Medical History:   Diagnosis Date    High cholesterol     Hypertension     Pre-diabetes       Surgical History Past Surgical History:   Procedure Laterality Date    APPENDECTOMY      back surgery      CARDIAC CATHETERIZATION      CARDIOVASCULAR STRESS TEST  CORONARY ARTERY BYPASS GRAFT      FOOT SURGERY      HERNIA REPAIR      PR CATH PLACE/CORON ANGIO, IMG SUPER/INTERP,W LEFT HEART VENTRICULOGRAPHY N/A 04/19/2018    Procedure: Left Heart Catheterization W Intervention;  Surgeon: Alvira Philips, MD;  Location: [facility] CATH;  Service: Cardiology    PR CATH PLACE/CORON ANGIO, IMG SUPER/INTERP,W LEFT HEART VENTRICULOGRAPHY N/A 11/06/2021    Procedure: Left Heart Catheterization;  Surgeon: Alvira Philips, MD;  Location: [facility] CATH;  Service: Cardiology    PR REVASCULARIZE FEM/POP ARTERY,ANGIOPLASTY/STENT N/A 08/02/2018    Procedure: Peripheral Angiography W Intervetion;  Surgeon: Alvira Philips, MD;  Location: [facility] CATH;  Service: Cardiology      Allergies Codeine   Medications He has a current medication list which includes the following prescription(s): praluent pen, amlodipine, aspirin, b complex c 11/calcium/dha/q10, clopidogrel, neuriva original, cyclobenzaprine, cyclobenzaprine, empty container, repatha sureclick, ezetimibe, lactobacillus rhamnosus gg, losartan, metformin, oxycodone-acetaminophen, oxycodone-acetaminophen, pantoprazole, pioglitazone, and rosuvastatin.   Family History His family history is not on file.   Social History He reports that he has quit smoking. His smoking use included cigars and cigarettes. He has never used smokeless tobacco. He reports that he does not drink alcohol and does not use drugs.Home 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 A diagnosis of Arthritis of right midfoot was also pertinent to this visit.   Estimated body mass index is 27.18 kg/m?? as calculated from the following:    Height as of 11/06/21: 182.9 cm (6').    Weight as of 04/10/22: 90.9 kg (200 lb 6.4 oz).     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 A diagnosis of Arthritis of right midfoot was also pertinent to this visit.  No results found for: A1C    No results found for: VITD    Imaging  No orders of the defined types were placed in this encounter.    Radiology studies were ordered and personally reviewed and interpreted by the encounter provider today..     CT shows TN>N-C1>subtalar arthritis  PVL report shows normal arterial flow RLE      DME ORDER:  Dx:  ,

## 2022-06-16 DIAGNOSIS — I2510 Atherosclerotic heart disease of native coronary artery without angina pectoris: Principal | ICD-10-CM

## 2022-06-16 DIAGNOSIS — E785 Hyperlipidemia, unspecified: Principal | ICD-10-CM

## 2022-06-16 DIAGNOSIS — Z789 Other specified health status: Principal | ICD-10-CM

## 2022-06-17 MED FILL — PRALUENT PEN 75 MG/ML SUBCUTANEOUS PEN INJECTOR: SUBCUTANEOUS | 28 days supply | Qty: 2 | Fill #2

## 2022-06-17 NOTE — Unmapped (Signed)
[facility] Refill Coordination Note    Dale Webb, DOB: 03-16-1956  Phone: [phone] (home)       All above HIPAA information was verified with patient.         06/15/2022     4:47 PM   Specialty Rx Medication Refill Questionnaire   Which Medications would you like refilled and shipped? Praluent Pen 75 mg   Please list all current allergies: none   Have you missed any doses in the last 30 days? No   Have you had any changes to your medication(s) since your last refill? No   How many days remaining of each medication do you have at home? 0   If receiving an injectable medication, next injection date is 06/19/2022   Have you experienced any side effects in the last 30 days? No   Please enter the full address (street address, city, state, zip code) where you would like your medication(s) to be delivered to. [address], [place], [place]. [phone]   Please specify on which day you would like your medication(s) to arrive. Note: if you need your medication(s) within 3 days, please call the pharmacy to schedule your order at [phone]  06/18/2022   Has your insurance changed since your last refill? No   Would you like a pharmacist to call you to discuss your medication(s)? No   Do you require a signature for your package? (Note: if we are billing Medicare Part B or your order contains a controlled substance, we will require a signature) No         Completed refill call assessment today to schedule patient's medication shipment from the [facility] [phone]).  All relevant notes have been reviewed.       Confirmed patient received a Conservation officer, historic buildings and a Surveyor, mining with first shipment. The patient will receive a drug information handout for each medication shipped and additional FDA Medication Guides as required.         REFERRAL TO PHARMACIST     Referral to the pharmacist: Not needed      [facility]     Shipping address confirmed in Epic.     Delivery Scheduled: Yes, Expected medication delivery date: 12/20.     Medication will be delivered via UPS to the prescription address in Epic WAM.    Joslyn Devon   [facility] Specialty Technician

## 2022-06-18 ENCOUNTER — Ambulatory Visit: Admit: 2022-06-18 | Discharge: 2022-06-19 | Payer: MEDICARE

## 2022-06-18 DIAGNOSIS — M1990 Unspecified osteoarthritis, unspecified site: Principal | ICD-10-CM

## 2022-06-18 DIAGNOSIS — I2510 Atherosclerotic heart disease of native coronary artery without angina pectoris: Principal | ICD-10-CM

## 2022-06-18 DIAGNOSIS — Z01818 Encounter for other preprocedural examination: Principal | ICD-10-CM

## 2022-06-18 DIAGNOSIS — I10 Essential (primary) hypertension: Principal | ICD-10-CM

## 2022-06-18 DIAGNOSIS — M19279 Secondary osteoarthritis, unspecified ankle and foot: Principal | ICD-10-CM

## 2022-06-18 DIAGNOSIS — E782 Mixed hyperlipidemia: Principal | ICD-10-CM

## 2022-06-18 DIAGNOSIS — E119 Type 2 diabetes mellitus without complications: Principal | ICD-10-CM

## 2022-06-18 NOTE — Unmapped (Signed)
Per Anesthesia's guidelines:    Please take the following medications the morning of your procedure with a sip of water:    Amlodipine  81 mg aspirin  Tylenol, if needed  Pantoprazole

## 2022-06-18 NOTE — Unmapped (Signed)
Addendum  created 06/18/22 1037 by Bryn Gulling, ANP    Clinical Note Signed

## 2022-07-07 NOTE — Unmapped (Signed)
Dale Webb requested a refill of their Repatha Sureclick via IVR. The [facility] has scheduled delivery per the patients request via UPS to be delivered to their prescription address on 07/09/2022.

## 2022-07-20 MED ORDER — ERGOCALCIFEROL (VITAMIN D2) 1,250 MCG (50,000 UNIT) CAPSULE
ORAL_CAPSULE | ORAL | 0 refills | 56 days
Start: 2022-07-20 — End: 2022-09-08

## 2022-07-20 MED ORDER — ACETAMINOPHEN 500 MG TABLET
ORAL_TABLET | Freq: Three times a day (TID) | ORAL | 0 refills | 14 days
Start: 2022-07-20 — End: 2022-08-03

## 2022-07-20 MED ORDER — ASPIRIN 81 MG CHEWABLE TABLET
ORAL_TABLET | Freq: Two times a day (BID) | ORAL | 0 refills | 30 days
Start: 2022-07-20 — End: 2022-08-19

## 2022-07-20 MED ORDER — PROMETHAZINE 12.5 MG TABLET
ORAL_TABLET | Freq: Four times a day (QID) | ORAL | 0 refills | 5 days | PRN
Start: 2022-07-20 — End: 2022-07-27

## 2022-07-20 MED ORDER — DOCUSATE SODIUM 100 MG CAPSULE
ORAL_CAPSULE | Freq: Two times a day (BID) | ORAL | 0 refills | 14 days
Start: 2022-07-20 — End: 2022-08-03

## 2022-07-20 MED ORDER — GABAPENTIN 100 MG CAPSULE
ORAL_CAPSULE | Freq: Three times a day (TID) | ORAL | 0 refills | 3 days
Start: 2022-07-20 — End: 2022-07-23

## 2022-07-20 MED ORDER — OXYCODONE 5 MG TABLET
ORAL_TABLET | ORAL | 0 refills | 5 days | PRN
Start: 2022-07-20 — End: ?

## 2022-07-22 MED ORDER — OXYCODONE 5 MG TABLET
ORAL_TABLET | ORAL | 0 refills | 5 days | PRN
Start: 2022-07-22 — End: ?

## 2022-07-22 MED ORDER — PROMETHAZINE 12.5 MG TABLET
ORAL_TABLET | Freq: Four times a day (QID) | ORAL | 0 refills | 5 days | PRN
Start: 2022-07-22 — End: 2022-07-29

## 2022-07-22 MED ORDER — DOCUSATE SODIUM 100 MG CAPSULE
ORAL_CAPSULE | Freq: Two times a day (BID) | ORAL | 0 refills | 14 days
Start: 2022-07-22 — End: 2022-08-05

## 2022-07-22 MED ORDER — GABAPENTIN 100 MG CAPSULE
ORAL_CAPSULE | Freq: Three times a day (TID) | ORAL | 0 refills | 3 days
Start: 2022-07-22 — End: 2022-07-25

## 2022-07-22 MED ORDER — ERGOCALCIFEROL (VITAMIN D2) 1,250 MCG (50,000 UNIT) CAPSULE
ORAL_CAPSULE | ORAL | 0 refills | 56 days
Start: 2022-07-22 — End: 2022-09-10

## 2022-07-22 MED ORDER — ACETAMINOPHEN 500 MG TABLET
ORAL_TABLET | Freq: Three times a day (TID) | ORAL | 0 refills | 14 days
Start: 2022-07-22 — End: 2022-08-05

## 2022-07-22 MED ORDER — ASPIRIN 81 MG CHEWABLE TABLET
ORAL_TABLET | Freq: Two times a day (BID) | ORAL | 0 refills | 30 days
Start: 2022-07-22 — End: 2022-08-21

## 2022-07-23 ENCOUNTER — Encounter: Admit: 2022-07-23 | Discharge: 2022-07-23 | Payer: MEDICARE

## 2022-07-23 ENCOUNTER — Ambulatory Visit: Admit: 2022-07-23 | Discharge: 2022-07-23 | Payer: MEDICARE

## 2022-07-23 ENCOUNTER — Ambulatory Visit: Admit: 2022-07-23 | Discharge: 2022-07-24 | Payer: MEDICARE

## 2022-07-23 HISTORY — PX: FOOT ARTHRODESIS, SUBTALAR: SUR53

## 2022-07-23 MED ORDER — OXYCODONE 5 MG TABLET
ORAL_TABLET | ORAL | 0 refills | 5 days | Status: CP | PRN
Start: 2022-07-23 — End: ?

## 2022-07-23 MED ORDER — DOCUSATE SODIUM 100 MG CAPSULE
ORAL_CAPSULE | Freq: Two times a day (BID) | ORAL | 0 refills | 14 days | Status: CP
Start: 2022-07-23 — End: 2022-08-06

## 2022-07-23 MED ORDER — PROMETHAZINE 12.5 MG TABLET
ORAL_TABLET | Freq: Four times a day (QID) | ORAL | 0 refills | 5 days | Status: CP | PRN
Start: 2022-07-23 — End: 2022-07-30

## 2022-07-23 MED ORDER — ERGOCALCIFEROL (VITAMIN D2) 1,250 MCG (50,000 UNIT) CAPSULE
ORAL_CAPSULE | ORAL | 0 refills | 56 days | Status: CP
Start: 2022-07-23 — End: 2022-09-11

## 2022-07-23 MED ORDER — GABAPENTIN 100 MG CAPSULE
ORAL_CAPSULE | Freq: Three times a day (TID) | ORAL | 0 refills | 3 days | Status: CP
Start: 2022-07-23 — End: 2022-07-26

## 2022-07-23 MED ORDER — ACETAMINOPHEN 500 MG TABLET
ORAL_TABLET | Freq: Three times a day (TID) | ORAL | 0 refills | 14 days | Status: CP
Start: 2022-07-23 — End: 2022-08-06

## 2022-07-23 MED ADMIN — fentaNYL (PF) (SUBLIMAZE) injection: INTRAVENOUS | @ 18:00:00 | Stop: 2022-07-23

## 2022-07-23 MED ADMIN — acetaminophen (TYLENOL) tablet 1,000 mg: 1000 mg | ORAL | @ 16:00:00 | Stop: 2022-07-23

## 2022-07-23 MED ADMIN — ePHEDrine (PF) 25 mg/5 mL (5 mg/mL) in 0.9% sodium chloride syringe Syrg: INTRAVENOUS | @ 18:00:00 | Stop: 2022-07-23

## 2022-07-23 MED ADMIN — sodium chloride irrigation (NS) 0.9 % irrigation solution: @ 18:00:00 | Stop: 2022-07-23

## 2022-07-23 MED ADMIN — fentaNYL (PF) (SUBLIMAZE) injection: INTRAVENOUS | @ 16:00:00 | Stop: 2022-07-23

## 2022-07-23 MED ADMIN — tobramycin (NEBCIN) injection: @ 18:00:00 | Stop: 2022-07-23

## 2022-07-23 MED ADMIN — phenylephrine 0.8 mg/10 mL (80 mcg/mL) injection: INTRAVENOUS | @ 18:00:00 | Stop: 2022-07-23

## 2022-07-23 MED ADMIN — dexAMETHasone (DECADRON) 4 mg/mL injection: INTRAVENOUS | @ 17:00:00 | Stop: 2022-07-23

## 2022-07-23 MED ADMIN — ondansetron (ZOFRAN) injection: INTRAVENOUS | @ 17:00:00 | Stop: 2022-07-23

## 2022-07-23 MED ADMIN — lactated Ringers infusion: 10 mL/h | INTRAVENOUS | @ 16:00:00 | Stop: 2022-07-23

## 2022-07-23 MED ADMIN — Propofol (DIPRIVAN) injection: INTRAVENOUS | @ 17:00:00 | Stop: 2022-07-23

## 2022-07-23 MED ADMIN — bupivacaine (PF) (MARCAINE) 0.5 % (5 mg/mL) injection (PF): PERINEURAL | @ 16:00:00 | Stop: 2022-07-23

## 2022-07-23 MED ADMIN — ceFAZolin (ANCEF) IVPB 2 g in 50 ml dextrose (premix): 2 g | INTRAVENOUS | @ 17:00:00 | Stop: 2022-07-23

## 2022-07-23 MED ADMIN — bupivacaine LIPOSOMAL (PF) (EXPAREL) 266 mg, sodium chloride (NS) 0.9 % 1 mL infiltration injection: 266 mg | @ 16:00:00 | Stop: 2022-07-23

## 2022-07-23 MED ADMIN — lidocaine (XYLOCAINE) 20 mg/mL (2 %) injection: INTRAVENOUS | @ 17:00:00 | Stop: 2022-07-23

## 2022-07-23 MED ADMIN — celecoxib (CeleBREX) capsule 200 mg: 200 mg | ORAL | @ 16:00:00 | Stop: 2022-07-23

## 2022-07-23 NOTE — Unmapped (Signed)
Orthopaedic Surgery Operative Note (CSN: [id number])  Date of Surgery: 07/23/2022  Attending Physician: Beverely Pace. 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, M19.90]   Procedure(s)   Procedure:    TRIPLE ARTHRODESIS  CPT(R) Code:  78295 - PR FUSION FOOT BONES,TRIPLE       Surgeons:  Primary: Valda Favia, MD  Resident - Assisting: Eli Phillips, MD  Location: [facility] OR [facility]  Anesthesia: Preop Block + General Anesthesia; regional/local:  popliteal single shot by anesthesia; adductor canal single shot - Exparel  Antibiotics: Ancef 2 grams IV preoperatively, local 2mg /mL Tobramycin irrigant in closed wound at conclusion of procedure   Tourniquet time: thigh tournquet, 250 mmHg  Total Tourniquet Time Documented:  Thigh (Right) - 60 minutes  Total: Thigh (Right) - 60 minutes  Case Length: 1 Hr 44 Min 0 Sec  Estimated Blood Loss: 10 mL    Complications: none  Specimens: none  None collected   Implant Name Type Inv. Item Serial No. Manufacturer Lot No. LRB No. Used Action   [id number] Freeze Dried Bone and Tissue Syringe Dbx Putty 5ml [id number] MUSCULOSKELETAL TRANSPLAN  Right 1 Implanted   Staple Dynanite 18x77mm Comppr Ession W/Inst - [id number] Hardware Staple Dynanite 18x33mm Comppr Ession W/Inst  [employer] [id number] Right 2 Implanted   Screw Comp Ft Xlg 7x44mm - [id number] Screws Pins Plates Screw Comp Ft Xlg 7x23mm  [employer]  Right 1 Implanted   Screw Headless Comp 7.0x84mm - [id number] Screws Pins Plates Screw Headless Comp 7.0x73mm  [employer]  Right 1 Implanted   Screw Headless Compression Cannulated 5.0x12mm - [id number] Screws Pins Plates Screw Headless Compression Cannulated 5.0x17mm  [employer]  Right 1 Implanted       Indications for Surgery:    Dale Webb is a 67 y.o. male with history of right hindfoot arthritis; previous TN fusion..  Benefits and risks of operative and nonoperative management were discussed prior to surgery.  See clinic note for details regarding signing consent.    Operative Findings:  Right modified triple arthrodesis (subtalar, calcaneocuboid, and naviculocuneiform fusions, with previously fused TN joint), with DBX allograft in fusion sites; fixation with screws and staples.      Procedure:    The patient was identified in the preoperative holding area where the right foot was marked with indelible ink. The patient was taken to the OR where a procedural timeout was called and the above noted anesthesia was induced.  Preoperative IV antibiotic had been dosed as noted above.  The lower extremity was prepped and draped in the usual sterile fashion.  A second preoperative pause was called.  The lower extremity was elevated for exsanguination and the thigh tourniquet was raised to the pressure noted above.    Next, attention was turned to subtalar and calcaneocuboid joint preparation for fusion. Sinus tarsi incision was made, sharply exposing with a 15 blade both the subtalar and calcaneocuboid joint. Osteotome, curette, rongeur, and 2mm drill were used to prepare the joints carefully. A separate medial incision was also made to expose the the medial naviculocuneiform joint between the anterior tibialis and posterior tibialis tendons, and just lateral to anterior tibialis. The same technique was used to prepare this joint. Guidewires for the parallel 7.0 mm cannulated screws were placed for the subtalar joint, into the center of the talar dome from the calcaneus. The guidewires  were measured and the drill was used to drill, followed by placement of the screws. Compression of the joint was noted. DBX paste injected into the joint.  The screws of were proper length on all fluoroscopic views. A nitinol staple was used for fixation of the calcaneocuboid joint; and a 5.0 headless compression screw and a nitinol staple were used for fixation of the medial naviculocuneiform joint.. Final fluoroscopy shots showed proper position of the hardware.    The wounds were thoroughly irrigated.  Closure with 3-0 nylon suture.  Injection with Tobramycin 2 mg/mL solution as an irrigant in the wound (total of 40mL irrigant, most of which exited the wound).  No dermabond.  Dressing with splint: gauze, Webril, ABD pads, and a 'U' plus posterior slab splint with ACE wrap.  The patient was awoken from anesthesia and taken to the PACU in stable condition without complication.     Post-op Plan/Instructions:     The patient will be discharged home today.  Weightbearing: NWB on operative lower extremity.  Pain management with oral medications.  Postoperative follow-up: 10-14 days for suture removal, convert to short leg NWB cast.    Surgeon Notes: I was present, scrubbed, and active in the entirety of the procedure.      DME  Mobility Device  The patient was prescribed either crutches or a walker (both are suitable).  This mobility device is required for the following reasons:    The patient has a mobility limitation that significantly impairs their ability to participate in one or more mobility-related activities of daily living (MRADL) in the home; and  The patient is able to safely use the mobility device; and  The functional mobility deficit can be sufficiently resolved with use of the mobility device     DME Documentation: Lower Extremity - Ambulatory AFOs  The patient was splinted.  No other lower extremity bracing DME given at this time.          Dale Shepherd, MD  Date: 07/23/2022  Time: 2:10 PM

## 2022-07-23 NOTE — Unmapped (Signed)
Brief Pre-operative History & Physical    Patient name: Dale Webb  CSN: [id number]  MRN: [id number]  Admit Date: 07/23/2022  Date of Surgery: 07/23/2022  Performing Service: Orthopedics    Code Status: Full Code      Assessment/Plan:      Dale Webb is a 67 y.o. male with Right Foot Arthritis, who presents for:  Procedure(s) (LRB):  SUBTALAR ARTHRODESIS (Right)  ARTHRODESIS MIDTARS/TARSOMETAT MX/TRANSVERSE (Right).     Consent obtained in office is accurate. Risks, benefits, and alternatives to surgery were reviewed, and all questions were answered.    Proceed to the OR as planned.         History of Present Illness:    Dale Webb is a 67 y.o. male with Right Foot Arthritis. He was recently seen in clinic, where a detailed HPI can be found. He was noted to benefit from:  Procedure(s) (LRB):  SUBTALAR ARTHRODESIS (Right)  ARTHRODESIS MIDTARS/TARSOMETAT MX/TRANSVERSE (Right).       Allergies  Lisinopril and Codeine    Medications    Current Facility-Administered Medications   Medication Dose Route Frequency Provider Last Rate Last Admin    bupivacaine LIPOSOMAL (PF) (EXPAREL) 266 mg, sodium chloride (NS) 0.9 % 1 mL infiltration injection  266 mg Infiltration Once Cobb, Bethena Midget, MD        And      EXPAREL ADMINISTERED WITHIN 96 HRS - NO BUPIVACAINE FOR 96 HOURS AFTER EXPAREL  1 each Other Continuous Cobb, Bethena Midget, MD        acetaminophen (TYLENOL) tablet 1,000 mg  1,000 mg Oral Once Annie Sable, MD        ceFAZolin (ANCEF) IVPB 2 g in 50 ml dextrose (premix)  2 g Intravenous For OR use Marthenia Rolling, MD        celecoxib (CeleBREX) capsule 200 mg  200 mg Oral Once Annie Sable, MD        lactated Ringers infusion  10 mL/hr Intravenous Continuous Marthenia Rolling, MD           Vital Signs  BP 149/77  - Pulse 53  - Temp 36.1 ??C (97 ??F) (Temporal)  - Resp 16  - Ht 182.9 cm (6')  - Wt 89.7 kg (197 lb 12 oz)  - SpO2 99%  - BMI 26.82 kg/m??   Facility age limit for growth %iles is 20 years.  Facility age limit for growth %iles is 20 years..     Physical Exam  General: Well developed, appears stated age, in no acute distress  Mental status: Alert and oriented x3  Cardiovascular: Normal  Pulmonary: Symmetric chest rise, unlabored breathing  Relevant System for Surgery: Surgical site examined and Patient was marked    Labs and Studies:  Lab Results   Component Value Date    WBC 4.6 11/06/2021    HGB 14.9 11/06/2021    HCT 43.8 11/06/2021    PLT 142 (L) 11/06/2021       Lab Results   Component Value Date    PT 11.1 04/13/2018    INR 0.97 04/13/2018

## 2022-07-23 NOTE — Unmapped (Signed)
Ambulatory Surgery Nerve Block Instructions and FAQs    As part of your care today, you have received a regional anesthetic (nerve block) to assist with the management of moderate to severe pain after your procedure. As has been explained to you or your responsible person, this procedure was performed to ensure the highest possible degree of comfort and satisfaction, and toreduce the need for opioid pain medications after your surgery at [facility]. Below you will find a list of instructions to help guide you through the next few days with your nerve block, and telephone numbers that you may call at any time for further assistance.    What you need to know about your nerve block:    If you have pain, you may take your prescription pain medicine as directed. It will not interact with the medicine administered through your nerve block. The pain pills and the nerve block will work effectively together to reduce your pain.  Take your prescribed pain medicine as soon as you start feeling any discomfort in the extremity.  Avoid placing cold, hot, hard or sharp items on your numb body part.  Also be careful to not rest your extremity on hard or hot/cold surfaces.  If you received a cooling device after surgery it is okay to use it.  Be careful not to bang or bump the numb body part. Try to keep it in a neutral, comfortable position.  Elevate the limb; this will help decrease swelling and help increase comfort when the block wears off.  You may regain some movement or feeling, or experience some breakthrough pain, when the initial strong numbing medicine wears off. This is normal and expected.  If you experience any of the following symptoms, please call the number(s) listed below for your anesthesiologist:  Ringing in the ears  Lightheadedness  Numbness or tingling around the lips    If you had an upper extremity nerve block:  Be aware of the position of your arm and protect it from compression or other injury.  Ensure that your entire arm is supported, including the wrist.  Do not let the wrist dangle over the sling.  There are some possible side effects that you may experience with your particular block. These side effects will resolve as your block wears off. They include:  hoarse voice  slight redness of the eye  smaller pupil  sagging eyelid  mild shortness of breath (especially if lying down)    If you had a lower extremity nerve block:  Be aware of the position of your leg and protect it from compression or other injury.  Pad the knee, ankle and heel, and keep the foot and ankle elevated if possible.  Do NOT bear any weight on the numb extremity until you completely recover the feeling and full muscle strength.  Always seek assistance when getting up and moving about.    Numbers to call for help:    If you have any questiosn about your nerve block or your pain control, if you have any of the symptoms listed above, or if you are so directed by another health care provider, please call or text the physician(s) who placed your nerve block, 24 hours a day at the following numbers:    Resident On Call - [phone]    Attending: Dr. Rise Paganini    Text or call: [phone]

## 2022-07-24 NOTE — Unmapped (Signed)
Addendum  created 07/24/22 0933 by Sinclair Ship, DO    Clinical Note Signed, Intraprocedure Event edited

## 2022-07-28 NOTE — Unmapped (Signed)
Addendum  created 07/28/22 0851 by Sinclair Ship, DO    Clinical Note Signed

## 2022-07-31 NOTE — Unmapped (Addendum)
Dale Webb requested a refill of their Repatha Sureclick via IVR. [facility] has scheduled delivery per the patients request via UPS to be delivered to their prescription address on 08/05/22.

## 2022-08-04 MED FILL — REPATHA SURECLICK 140 MG/ML SUBCUTANEOUS PEN INJECTOR: SUBCUTANEOUS | 28 days supply | Qty: 2 | Fill #1

## 2022-08-05 ENCOUNTER — Ambulatory Visit: Admit: 2022-08-05 | Discharge: 2022-08-06 | Payer: MEDICARE

## 2022-08-05 NOTE — Unmapped (Signed)
[facility] ORTHOPAEDICS CLINIC NOTE  Date: 08/05/2022   Attending: [name]    Primary Care Physician: Albin Fischer Dale Webb is a 67 y.o. male with the following visit diagnoses:    ICD-10-CM   1. 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  Short leg fiberglass cast, well-molded, placed with webril and fiberglass rolls.  Placement supervised by NP.  Remain nonweightbearing.    Requested Prescriptions      No prescriptions requested or ordered in this encounter      Scheduling Notes:  Planned No follow-ups on file.; Scheduled Visit date not found  - Xrays next visit:  Right foot 3 view NWB OOC    SUBJECTIVE:   Doing well. Minimal discomfort    OBJECTIVE:  PHYSICAL EXAM  General Appearance well-nourished and no acute distress   Cardiovascular Minimal, appropriate swelling   MUSCULOSKELETAL    Foot/ankle   Healing incisions.  No cellulitis, erythema, or drainage.         PROMIS Physical Function Score  Physical Function CAT Score: (not recorded)     PROMIS Pain Interference Score  Pain Interference CAT Score: (not recorded)     PROMIS Depression Score  Depression CAT Score: (not recorded)     PROMIS Sleep Score  Sleep CAT Score: (not recorded)        No orders of the defined types were placed in this encounter.    Requested Prescriptions      No prescriptions requested or ordered in this encounter          Note created by Ancil Linsey, NP, August 05, 2022 9:32 AM

## 2022-08-10 MED ORDER — AMLODIPINE 10 MG TABLET
ORAL_TABLET | 0 refills | 0 days
Start: 2022-08-10 — End: ?

## 2022-08-12 MED ORDER — AMLODIPINE 10 MG TABLET
ORAL_TABLET | 0 refills | 0 days | Status: CP
Start: 2022-08-12 — End: ?

## 2022-08-12 NOTE — Unmapped (Signed)
Refill request received for patient.      Medication Requested: amlodipine  Last Office Visit: Visit date not found   Next Office Visit: Visit date not found  Last Prescriber: Willene Hatchet    Nurse refill requirements met? No  If not met, why: needs office visit    Sent to: Provider for signing  If sent to provider, which provider?: Willene Hatchet

## 2022-08-18 MED ORDER — CLOPIDOGREL 75 MG TABLET
ORAL_TABLET | Freq: Every day | ORAL | 2 refills | 90 days | Status: CP
Start: 2022-08-18 — End: 2023-05-15

## 2022-08-18 MED ORDER — LOSARTAN 50 MG TABLET
ORAL_TABLET | Freq: Every day | ORAL | 2 refills | 90 days | Status: CP
Start: 2022-08-18 — End: 2023-05-15

## 2022-08-18 NOTE — Unmapped (Signed)
Refill request received for patient.      Medication Requested: Plavix and Losartan  Last Office Visit: Visit date not found   Next Office Visit: Visit date not found  Last Prescriber: Hospital provider     Nurse refill requirements met? No  If not met, why: Hospital provider     Sent to: Provider for signing  If sent to provider, which provider?: [facility]

## 2022-08-31 NOTE — Unmapped (Signed)
Dale Webb requested a refill of their Repatha via IVR. [facility] has scheduled delivery per the patients request via UPS to be delivered to their prescription address on 09/02/22.

## 2022-09-01 MED FILL — REPATHA SURECLICK 140 MG/ML SUBCUTANEOUS PEN INJECTOR: SUBCUTANEOUS | 28 days supply | Qty: 2 | Fill #2

## 2022-09-01 MED FILL — EMPTY CONTAINER: 120 days supply | Qty: 1 | Fill #1

## 2022-09-02 ENCOUNTER — Ambulatory Visit: Admit: 2022-09-02 | Discharge: 2022-09-03 | Payer: MEDICARE

## 2022-09-02 ENCOUNTER — Ambulatory Visit
Admit: 2022-09-02 | Discharge: 2022-09-03 | Payer: MEDICARE | Attending: Foot and Ankle Surgery | Primary: Foot and Ankle Surgery

## 2022-09-02 NOTE — Unmapped (Unsigned)
Orthopaedic Foot and Ankle Division  Encounter Provider: [name], MD  Date of Service: 09/02/2022     Primary Care Provider: [name]  Referring Provider: Valda Favia    ICD-10-CM   1. 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 Arthritis of right midfoot  M19.071    Orthopaedic notes: Right modified triple arthrodesis (subtalar, calcaneocuboid, and naviculocuneiform fusions, with previously fused TN joint), with DBX allograft in fusion sites; fixation with screws and staples [name] 07-23-22)  PROMIS Physical Function CAT:     PROMIS Pain Interference CAT:       Global note    ASSESSMENT:   Appropriate progress     PROMIS Physical Function CAT:     PROMIS Pain Interference CAT:     [name]  PLAN:        Scheduling Notes:  Planned No follow-ups on file.; Scheduled 09/30/2022  - Xrays next visit:  Right ankle arthritis series OOC     Treatment for the right lower extremity.  Sutures previously removed, wound healing well.  Short leg fiberglass cast, well-molded, placed with webril and fiberglass rolls.  Placement supervised by physician.    Touchdown weightbearing.  Boot next visit.          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 A diagnosis of Arthritis of right midfoot was also pertinent to this visit.  HPI:  67 y.o. male who is doing well.       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 A diagnosis of Arthritis of right midfoot was also pertinent to this visit.   No acute distress; alert and cooperative.  Musculoskeletal Healing incision(s).  No cellulitis, erythema, or drainage.       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 A diagnosis of Arthritis of right midfoot was also pertinent to this visit.  Imaging  Radiology studies were ordered and personally reviewed and interpreted by the encounter provider today..  Well aligned with hardware in appropriate position.        DME ORDER:  Dx:  , incision(s).  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 A diagnosis of Arthritis of right midfoot was also pertinent to this visit.  Imaging  {Imaging phrases:39441::Radiology studies were ordered and personally reviewed and interpreted by the encounter provider today.,Previous imaging was personally reviewed and interpreted by the encounter provider today.,No new films today}.  {Blank single:39441::Well aligned with hardware in appropriate position.,Fracture remains well aligned with evidence of healing.,Stable alignment, evidence of healing., }  ***      DME ORDER:  Dx:  ,

## 2022-09-03 NOTE — Unmapped (Addendum)
DIVISION OF CARDIOLOGY  [facility] [place]        Date of Service: 09/04/2022    Return Patient Clinic Note    PCP: Referring Provider:   [name]  [address] [place] [address]  Phone: [phone]  Fax: [facility], [facility]  [address]  [place],  [place] [address]  Phone: [phone]  Fax:      Assessment and Plan:      Dale Webb is a 67 y.o. male w/PMHx of coronary artery disease and a history of inferior STEMI in 2018, s/p four-vessel CABG LIMA-LAD, SVG-diagonal, SVG-OM1, SVG-RCA on 06/19/2017 (all 3 SVGs known to be occluded), essential hypertension, hyperlipidemia and OSA, underwent left heart catheterization on April 19, 2018, received 2X DES to RCA, b/l iliac stenting on 08/02/2018.  PCI to LAD and RCA in 10/2021 with improvement of symptoms, presents today to follow up.    Coronary artery disease  Patient initially presented to [facility] on December 2018 for chest pain, was found to have inferior STEMI and subsequently underwent CABG x 4 (LIMA-LAD, SVG-DIAG, SVG-OM, SVG-DIST RCA). He received successful PCI of mid and distal RCA with placement of 2 DES in 2019. Most recently, underwent PCI 10/2021 due to unstable angina (balloon angioplasty to RCA and LAD for in-stent restenosis). TTE 10/2021 with mild abnormalities but EF normal. Today, denies any anginal symptoms.  --Continue aspirin 81 mg daily  --continue clopidogrel (CYP2C19 *1/*17).    Peripheral vascular disease s/p iliac stents on Feb 2020    Angiography on 04/19/18 showed moderate right common iliac lesion (50%) with approximately a gradient and severe left common iliac disease with an 80% ostial stenosis. Underwent b/l stenting to iliac arteries on 08/02/2018. Symptoms improved after procedure, and continues to remain asymptomatic.   - Encouraged pt to continue exercise      Essential hypertension: BP today 138/74. States at home more consistently 120s systolic.    - continue home with lorsartan 100 mg daily, amlodipine 10 mg daily    Hyperlipidemia: LDL 186 10/2021, repeat 11/2021 LDL 81. Has been off statins due to prior issues with associated hyperglycemia and hesitant to restart. Evolocumab started last visit, tolerating well.   - Continue ezetimibe 10 mg, evolocumab  - Repeat lipid panel today     DM- Per patient last A1c 6.6 11/2021. Managed with pioglitazone. Though does not have history of HF, would likely benefit from alternative drug therapy for diabetes.     Former tobacco use - quit 07/30/13      Return in about 1 year (around 09/04/2023).      Subjective:        Reason for Visit: Follow-up    History of Present Illness: Dale Webb is a 67 y.o. with a past medical history of  inferior STEMI in 2018, s/p four-vessel CABG LIMA-LAD, SVG-diagonal, SVG-OM1, SVG-RCA on 06/19/2017, essential hypertension, hyperlipidemia and OSA, with multiple PCIs since that presents for follow-up.    Since last visit patient is doing well. Recently had R foot orthopedic surgery about a month ago, still remains in cast with crutches. No chest pain or shortness of breath, but physical activity has been limited recently due to recent surgery. Tolerating taking evolocumab without side effects, and not bothered by injections. Continues to have easy bruising but no other bleeding concerns. BP at home has ranged from 120s systolic to 130s.     Cardiovascular History:  CAD  CABG  PCI  HTN  HLD  OSA  Prior tobacco use     Cardiovascular Studies Date Comments     ECG     Echo 06/17/17 - Left ventricle: Poor image quality possibl inferolateral  hypokinesis on apical views overall EF normal range. The cavity  size was normal. Wall thickness was increased in a pattern of  mild LVH. Systolic function was normal. The estimated ejection  fraction was in the range of 55% to 60%. Wall motion was normal;  there were no regional wall motion abnormalities.  - Mitral valve: Calcified annulus.  - Atrial septum: No defect or patent foramen ovale was identified.     Stress test     Cardiac catheterization   11/06/2021                                                                04/19/18                                     Findings:  Mild inferior and lateral hypokinesis with preserved LV systolic function  Elevated LVEDP at 24 mm Hg  Coronary artery disease including 60% mid-RCA instent restenosis, 70% distal RCA instent restenosis (FFR = 0.82), 80% proximal LAD stenosis (FFR = 0.82), 95% D1 stenosis, occluded mid-LAD (fills via LIMA graft) and occluded OM1 (fills via collaterals from the LIMA to LAD graft).    Successful balloon angioplasty of mid RCA instent restenosis   Successful balloon angioplasty of distal RCA instent restenosis  Successful balloon angioplasty of proximal LAD stenosis (stent not used because of proximity to origin of D1)  Prior coronary bypass surgery with occluded SVG to RCA, occluded SVG to OM, occluded SVG to first diagonal and patent LIMA to LAD  Patent stents in right and left common iliac arteries         Coronary artery disease including subtotally occluded right coronary artery   Successful PCI of mid and distal RCA with placement of two Xience DES with excellent angiographic result and TIMI III flow.   Prior coronary bypass surgery with occluded SVG to RCA, occluded SVG to OM, occluded SVG to first diagonal and patent LIMA to LAD  Moderate right common iliac lesion (50%) with approximately a gradient.    Severe left common iliac disease with an 80% ostial stenosis  Low normal left ventricular filling pressures (LVEDP = 10 mm Hg).  Septal hypokinesis with preserved LV systolic function (estimated LVEF = 50%).          CYP2C19 Genotype 04/19/2018 *1/*19   Electrophysiology      Cardiovascular Surgery 06/19/2017 CABG x 4 (LIMA-LAD, SVG-DIAG, SVG-OM, SVG-DIST RCA) with EVH from RIGHT GREATER SAPHENOUS VEIN and LEFT INTERNAL MAMMARY ARTERY HARVEST by Dr. Tyrone Sage     Peripheral Vascular Studies     Anginal Equivalent       Relevant Past Medical History:    Patient's Medications   New Prescriptions    No medications on file   Previous Medications    ACETAMINOPHEN (TYLENOL) 325 MG TABLET    Take by mouth every four (4) hours as needed.    AMLODIPINE (NORVASC) 10 MG TABLET  Take 1 tablet by mouth once daily    B COMPLEX C 11/CALCIUM/DHA/Q10 (BRAIN MIGHT-DHA-CO Q10 ORAL)    Take 1 tablet by mouth in the morning.    CLOPIDOGREL (PLAVIX) 75 MG TABLET    Take 1 tablet (75 mg total) by mouth daily.    COFFEE XT-PHOSPHATIDYL SERINE (NEURIVA ORIGINAL) 100-100 MG CAP    Take 1 capsule by mouth in the morning.    CYCLOBENZAPRINE (FLEXERIL) 5 MG TABLET    Take 1 tablet (5 mg total) by mouth Three (3) times a day as needed for muscle spasms.    EMPTY CONTAINER (SHARPS-A-GATOR DISPOSAL SYSTEM) MISC    Use as directed for sharps disposal    ERGOCALCIFEROL-1,250 MCG, 50,000 UNIT, (VITAMIN D2-1,250 MCG, 50,000 UNIT,) 1,250 MCG (50,000 UNIT) CAPSULE    Take 1 capsule (1,250 mcg total) by mouth once a week for 8 doses.    EVOLOCUMAB (REPATHA SURECLICK) 140 MG/ML PNIJ    Inject the contents of one pen (140 mg) under the skin every fourteen (14) days.    EZETIMIBE (ZETIA) 10 MG TABLET    Take 1 tablet by mouth once daily    LACTOBACILLUS RHAMNOSUS GG (CULTURELLE) 10 BILLION CELL CAPSULE    Take 1 capsule by mouth daily.    LOSARTAN (COZAAR) 50 MG TABLET    Take 2 tablets (100 mg total) by mouth daily.    OXYCODONE (ROXICODONE) 5 MG IMMEDIATE RELEASE TABLET    Take 1 tablet (5 mg total) by mouth every four (4) hours as needed for pain for up to 30 doses.    OXYCODONE-ACETAMINOPHEN (PERCOCET) 5-325 MG PER TABLET    Take 1 tablet by mouth every six (6) hours as needed.    PANTOPRAZOLE (PROTONIX) 40 MG TABLET    Take 1 tablet (40 mg total) by mouth two (2) times a day.    PIOGLITAZONE (ACTOS) 30 MG TABLET    Take 1 tablet (30 mg total) by mouth daily.   Modified Medications    No medications on file   Discontinued Medications    GABAPENTIN (NEURONTIN) 100 MG CAPSULE    Take 1 capsule (100 mg total) by mouth Three (3) times a day for 3 days.       Allergies:  Allergies   Allergen Reactions    Lisinopril Cough    Codeine Nausea Only       Social History:  He  reports that he has quit smoking. His smoking use included cigars and cigarettes. He has never used smokeless tobacco. He reports that he does not drink alcohol and does not use drugs.    Family History:  His family history is not on file.    Review of Systems  10 systems were reviewed and negative except as noted in HPI.      Objective:       Physical Exam  BP 138/74 (BP Site: R Arm, BP Position: Sitting, BP Cuff Size: X-Large)  - Pulse 70  - SpO2 97%    Wt Readings from Last 3 Encounters:   07/23/22 89.7 kg (197 lb 12 oz)   06/18/22 88.5 kg (195 lb 3.2 oz)   04/10/22 90.9 kg (200 lb 6.4 oz)       General:  Alert, no distress.   HEENT:  EOMI, sclerae anicteric, MMM.   Neck: Supple, no carotid bruit. JVP   Lungs:   CTAB bilaterally with normal WOB.   Heart:   Regular rate and rhythm, no murmurs, S1/S2 heard  Abdomen:   Soft, non-tender, non-distended.   Extremities: RLE in foot cast. No edema bilaterally.   Skin: No lesions/rashes.   Neurologic: No focal deficits.     Most recent labs     Lab Results   Component Value Date    WBC 4.6 11/06/2021    RBC 4.78 11/06/2021    HGB 14.9 11/06/2021    HCT 43.8 11/06/2021    MCV 91.7 11/06/2021    MCH 31.1 11/06/2021    MCHC 33.9 11/06/2021    RDW 13.5 11/06/2021    PLT 142 (L) 11/06/2021    MPV 10.3 11/06/2021       Lab Results   Component Value Date    NA 142 11/06/2021    K 4.5 11/06/2021    CL 110 (H) 11/06/2021    CO2 24.0 11/06/2021    BUN 8 (L) 11/06/2021    CREATININE 0.71 11/06/2021    GLU 117 (H) 11/06/2021    CALCIUM 9.9 11/06/2021       Lab Results   Component Value Date    CHOL 261 (H) 11/06/2021     Lab Results   Component Value Date    HDL 61 (H) 11/06/2021     Lab Results   Component Value Date    LDL 186 (H) 11/06/2021     Lab Results   Component Value Date    TRIG 71 11/06/2021     No components found for: [facility]    Lab Results   Component Value Date    PT 11.1 04/13/2018    INR 0.97 04/13/2018       Teaching Physician Note  I saw the patient with the Resident. I discussed the findings, assessment and plan with the Resident and agree with the findings and plan as documented in the Resident's note.    Willene Hatchet MD

## 2022-09-04 ENCOUNTER — Ambulatory Visit
Admit: 2022-09-04 | Discharge: 2022-09-05 | Payer: MEDICARE | Attending: Cardiovascular Disease | Primary: Cardiovascular Disease

## 2022-09-04 DIAGNOSIS — I10 Essential (primary) hypertension: Principal | ICD-10-CM

## 2022-09-04 DIAGNOSIS — E782 Mixed hyperlipidemia: Principal | ICD-10-CM

## 2022-09-04 DIAGNOSIS — I739 Peripheral vascular disease, unspecified: Principal | ICD-10-CM

## 2022-09-04 DIAGNOSIS — I771 Stricture of artery: Principal | ICD-10-CM

## 2022-09-04 DIAGNOSIS — I2510 Atherosclerotic heart disease of native coronary artery without angina pectoris: Principal | ICD-10-CM

## 2022-09-26 NOTE — Unmapped (Signed)
Dale Webb requested a refill of their Repatha Sureclick via IVR. The [facility] has scheduled delivery per the patients request via UPS to be delivered to their prescription address on 09/30/22.

## 2022-09-29 MED FILL — REPATHA SURECLICK 140 MG/ML SUBCUTANEOUS PEN INJECTOR: SUBCUTANEOUS | 28 days supply | Qty: 2 | Fill #3

## 2022-09-30 ENCOUNTER — Ambulatory Visit: Admit: 2022-09-30 | Discharge: 2022-10-01 | Payer: MEDICARE

## 2022-09-30 ENCOUNTER — Ambulatory Visit
Admit: 2022-09-30 | Discharge: 2022-10-01 | Payer: MEDICARE | Attending: Foot and Ankle Surgery | Primary: Foot and Ankle Surgery

## 2022-09-30 NOTE — Unmapped (Deleted)
Orthopaedic Foot and Ankle Division  Encounter Provider: [name], MD  Date of Service: 09/30/2022     Primary Care Provider: [name]  Referring Provider: Valda Favia    ICD-10-CM   1. 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 Arthritis of right midfoot  M19.071    Orthopaedic notes: Right modified triple arthrodesis (subtalar, calcaneocuboid, and naviculocuneiform fusions, with previously fused TN joint), with DBX allograft in fusion sites; fixation with screws and staples [name] 07-23-22)  PROMIS Physical Function CAT:     PROMIS Pain Interference CAT:       Global note    ASSESSMENT:   Appropriate progress ***    PROMIS Physical Function CAT:     PROMIS Pain Interference CAT:     [name]  PLAN:   {Preop?:454340:: }    Scheduling Notes:  Planned No follow-ups on file.; Scheduled Visit date not found  - Xrays next visit: {JNTxrays:34812} {Blank single:39441::in current cast,OOC, }     Treatment for the {Right-left, no caps:5604} lower extremity.  {Sutures:19197::Sutures removed, steri strips placed.,Sutures removed.,Absorbable sutures left in place.,Sutures left in place.,Sutures partially removed, ***.,Home exercise program given, see patient instructions.,Sutures previously removed, wound healing well., ,No wounds or sutures to address.}  {Cast/boot:19197::Short leg fiberglass cast, well-molded, placed with webril and fiberglass rolls.  Placement supervised by physician.,CAST+CAST SHOE: Short leg fiberglass cast, well-molded, placed with webril and fiberglass rolls.  Placement supervised by physician.The patient was prescribed a prefabricated cast shoe.  Diagnosis in encounter documentation.  The patient is ambulatory but has weakness and/or instability of his noted lower extremity which requires stabilization from the semi-rigid/rigid orthosis to potentially improve their function.,Total contact cast placed with webril, plaster, and fiberglass, well-molded.  Placement supervised by physician.,Cam walker boot to protect the foot and ankle during weightbearing.  Placement supervised by physician.  The patient is ambulatory but has weakness and/or instability of the lower extremity (laterality noted above which requires stabilization from the semi-rigid/rigid orthosis to potentially improve their function.,Hard-soled shoe to protect the foot during weightbearing.  The patient is ambulatory but has weakness and/or instability of her right lower extremity which requires stabilization from the semi-rigid/rigid orthosis to potentially improve their function.,Toe strapping with 2 inch Kling and 1/4 inch canvas tape performed by physician, hard-soled shoe., ,May wear regular shoe.}    {WB:19197::Weightbearing as tolerated through the heel.,Remain nonweightbearing.,Touchdown weightbearing.,Achilles protocol.,Advance weightbearing with 25% per week protocol., ,Weightbearing as tolerated.}  ***    {Procedure, injection, ZOXW:960454:: }     Requested Prescriptions      No prescriptions requested or ordered in this encounter      Orders Placed This Encounter   Procedures    XR Ankle Arthritis Series Right         History:  Chief complaint: No chief complaint on file.   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 A diagnosis of Arthritis of right midfoot was also pertinent to this visit.  HPI:  67 y.o. male who is doing well.       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 A diagnosis of Arthritis of right midfoot was also pertinent to this visit.   No acute distress; alert and cooperative.  Musculoskeletal {1:19197::Skin is benign.  Grossly well aligned.,Healing incision(s).  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 A diagnosis of Arthritis of right midfoot was also pertinent to this visit.  Imaging  {Imaging phrases:39441::Radiology studies were ordered and personally reviewed and interpreted by the encounter provider today.,Previous imaging was personally reviewed and interpreted by the encounter provider today.,No new films today}.  {Blank single:39441::Well aligned with hardware in appropriate position.,Fracture remains well aligned with evidence of healing.,Stable alignment, evidence of healing., }  ***      DME ORDER:  Dx:  ,

## 2022-09-30 NOTE — Unmapped (Signed)
Orthopaedic Foot and Ankle Division  Encounter Provider: [name], MD  Date of Service: 09/30/2022     Primary Care Provider: [name]  Referring Provider: Valda Favia    ICD-10-CM   1. 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 Arthritis of right midfoot  M19.071    Orthopaedic notes: Right modified triple arthrodesis (subtalar, calcaneocuboid, and naviculocuneiform fusions, with previously fused TN joint), with DBX allograft in fusion sites; fixation with screws and staples [name] 07-23-22)  PROMIS Physical Function CAT:     PROMIS Pain Interference CAT:       Global note    ASSESSMENT:   Appropriate progress     PROMIS Physical Function CAT:     PROMIS Pain Interference CAT:     [name]  PLAN:        Scheduling Notes:  Planned No follow-ups on file.; Scheduled Visit date not found  - Xrays next visit:  Right ankle arthritis series       Treatment for the right lower extremity.  Sutures previously removed, wound healing well.  Cam walker boot to protect the foot and ankle during weightbearing.  Placement supervised by physician.  The patient is ambulatory but has weakness and/or instability of the lower extremity (laterality noted above which requires stabilization from the semi-rigid/rigid orthosis to potentially improve their function.    Advance weightbearing with 25% per week protocol.            Requested Prescriptions      No prescriptions requested or ordered in this encounter      Orders Placed This Encounter   Procedures    Orthopedics DME Order    XR Ankle Arthritis Series Right         History:  Chief complaint: No chief complaint on file.   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 A diagnosis of Arthritis of right midfoot was also pertinent to this visit.  HPI:  67 y.o. male who is doing well. 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 A diagnosis of Arthritis of right midfoot was also pertinent to this visit.   No acute distress; alert and cooperative.  Musculoskeletal Healing incision(s).  No cellulitis, erythema, or drainage.       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 A diagnosis of Arthritis of right midfoot was also pertinent to this visit.  Imaging  Radiology studies were ordered and personally reviewed and interpreted by the encounter provider today..  Well aligned with hardware in appropriate position.        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

## 2022-09-30 NOTE — Unmapped (Signed)
_____________________________________    How to advance weightbearing on your foot and ankle    You may advance to the next stage when you are having minimal or no pain at the current stage.  Each stage should be 1-2 weeks.    Stage  % weight Crutches? Boot for walking? Boot at night? What it should feel like   1 25% Yes - 2 yes As needed Touching down the foot, full crutch/walker support   2 50% Yes - 2 yes no Able to stand equally on both feet, but still with crutch/walker support for all steps   3 75% Yes (single crutch or cane) yes no Able to lean on the foot, but still with crutch/walker support   4 100% yes,(single crutch or cane) as needed yes no Wean off crutches   5 100% no yes, as needed no Wean out of boot, into shoe   6 100% no no no Normal walking in a shoe     You may remove the boot for bathing or when you are resting or sitting during the first 4 stages, otherwise it should be worn while walking during the first 4 stages.  _____________________________________     Controlled Ankle Motion (CAM) Boot            Patient Education        Ankle: Rehab Exercises  Introduction  Here are some examples of exercises for you to try. The exercises may be suggested for a condition or for rehabilitation. Start each exercise slowly. Ease off the exercises if you start to have pain.  You will be told when to start these exercises and which ones will work best for you.  How to do the exercises  Calf stretch (seated, knee straight)    Sit on the floor with your affected leg straight and resting on the floor.  Place a towel around your affected foot.  Hold one end of the towel in each hand.  Pull back gently with the towel so that you feel a stretch in your calf.  Hold the position for 15 to 30 seconds.  Repeat 2 to 4 times.  It's a good idea to repeat these steps with your other leg.  Calf stretch (seated, knee bent)    Sit on the floor with your affected leg straight and resting on the floor. (Or you can sit on any flat surface, such as a bed.)  Place a pillow or foam roll under the knee of your affected leg.  Place a towel around your affected foot under the ball of that foot.  Hold one end of the towel in each hand.  Pull back gently with the towel so that you feel a stretch in your calf.  Hold the position for 15 to 30 seconds.  Repeat 2 to 4 times.  It's a good idea to repeat these steps with your other leg.  Ankle plantar flexion    Sit with your affected leg straight and resting on the floor. Your other leg should be bent, with that foot flat on the floor.  Keeping your affected leg straight, gently flex your foot downward so your toes are pointed away from your body. Then slowly relax your foot to the starting position.  Repeat 8 to 12 times.  It's a good idea to repeat these steps with your other foot.  Ankle dorsiflexion    Sit with your affected leg straight and supported on the floor. Your other leg should be  bent, with that foot flat on the floor.  Keeping your leg straight, gently flex your foot back so your toes point upward. Then slowly relax your foot to the starting position.  Repeat 8 to 12 times.  It's a good idea to repeat these steps with your other leg.  Resisted ankle plantar flexion    Sit with your affected leg straight and resting on the floor.  Place an exercise band around your affected foot just under the toes.  Hold each end of the band in each hand, with your hands above your knees.  Keeping your affected leg straight, gently flex your foot downward so your toes are pointed away from your body. Then slowly relax your foot to the starting position.  Repeat 8 to 12 times.  It's a good idea to repeat these steps with your other leg.  Resisted ankle dorsiflexion    Tie the ends of an exercise band together to form a loop. Attach one end of the loop to a secure object, or shut a door on it to hold it in place. (Or you can have someone hold one end of the loop to provide resistance.)  Sit on the floor or in a chair, and loop the other end of the band over the top of your affected foot.  Keeping your knee and leg straight, slowly flex your foot back toward you so you are pulling back on the exercise band. Then slowly return to the starting position.  Repeat 8 to 12 times.  It's a good idea to repeat these steps with your other foot.  Resisted ankle inversion    Sit on the floor with your legs straight out in front of you. Cross your good leg over your affected leg.  Hold both ends of an exercise band in one hand and loop the band around the inside of your affected foot. Then press your other foot against the band.  Keeping your legs crossed, slowly push your affected foot against the band so that foot moves away from your other foot. Then slowly relax.  Repeat 8 to 12 times.  It's a good idea to repeat these steps with your other leg.  Resisted ankle eversion    Sit on the floor with your legs straight.  Hold both ends of an exercise band in one hand and loop the band around the outside of your affected foot. Then press your other foot against the band.  Keeping your leg straight, slowly push your affected foot outward against the band and away from your other foot without letting your leg rotate. Then slowly relax.  Repeat 8 to 12 times.  It's a good idea to repeat these steps with your other foot.  Ankle alphabet    Sit in a chair with your feet flat on the floor. (You can also do this exercise lying on your back with your affected leg propped up on a pillow).  Lift the heel of your affected foot off the floor, and slowly trace the letters of the alphabet.  It's a good idea to repeat these steps with your other foot.  Heel raise    Stand with your feet a few inches apart, with your hands lightly resting on a counter or chair in front of you.  Slowly raise your heels 1 to 2 inches off the floor while keeping your knees straight. Hold for about 6 seconds, then slowly lower your heels to the floor.  Repeat 8 to 12 times.  Follow-up care is a key part of your treatment and safety. Be sure to make and go to all appointments, and call your doctor if you are having problems. It's also a good idea to know your test results and keep a list of the medicines you take.  Current as of: January 13, 2022               Content Version: 14.0  ?? 2006-2024 [employer].   Care instructions adapted under license by [facility] If you have questions about a medical condition or this instruction, always ask your healthcare professional. [employer] disclaims any warranty or liability for your use of this information.

## 2022-10-18 MED ORDER — EZETIMIBE 10 MG TABLET
ORAL_TABLET | 0 refills | 0 days
Start: 2022-10-18 — End: ?

## 2022-10-21 MED ORDER — EZETIMIBE 10 MG TABLET
ORAL_TABLET | 0 refills | 0 days | Status: CP
Start: 2022-10-21 — End: ?

## 2022-10-21 NOTE — Unmapped (Signed)
Refill request received for patient.      Medication Requested: Ezetimibe  Last Office Visit: Visit date not found   Next Office Visit: Visit date not found  Last Prescriber: Jaymes Graff    Nurse refill requirements met? No  If not met, why: Last OV 11/2021 was to FU in 6 months    Sent to: Provider for signing  If sent to provider, which provider?: Jaymes Graff

## 2022-10-23 NOTE — Unmapped (Signed)
[name] Refill Coordination Note    Dale Webb, DOB: 17-Aug-1955  Phone: [phone] (home)       All above HIPAA information was verified with patient.         10/23/2022     9:39 AM   Specialty Rx Medication Refill Questionnaire   Which Medications would you like refilled and shipped? Repatha   Please list all current allergies: none   Have you missed any doses in the last 30 days? No   Have you had any changes to your medication(s) since your last refill? No   How many days remaining of each medication do you have at home? 1   If receiving an injectable medication, next injection date is 10/23/2022   Have you experienced any side effects in the last 30 days? No   Please enter the full address (street address, city, state, zip code) where you would like your medication(s) to be delivered to. [address], [place], [place]. [phone]   Please specify on which day you would like your medication(s) to arrive. Note: if you need your medication(s) within 3 days, please call the pharmacy to schedule your order at [phone]  10/30/2022   Has your insurance changed since your last refill? No   Would you like a pharmacist to call you to discuss your medication(s)? No   Do you require a signature for your package? (Note: if we are billing Medicare Part B or your order contains a controlled substance, we will require a signature) No         Completed refill call assessment today to schedule patient's medication shipment from the [facility] [phone]).  All relevant notes have been reviewed.       Confirmed patient received a Conservation officer, historic buildings and a Surveyor, mining with first shipment. The patient will receive a drug information handout for each medication shipped and additional FDA Medication Guides as required.         REFERRAL TO PHARMACIST     Referral to the pharmacist: Not needed      [facility]     Shipping address confirmed in Epic.     Delivery Scheduled: Yes, Expected medication delivery date: 10/30/22.     Medication will be delivered via UPS to the prescription address in Epic WAM.    Dale Webb   [facility] Specialty Technician

## 2022-10-29 MED FILL — REPATHA SURECLICK 140 MG/ML SUBCUTANEOUS PEN INJECTOR: SUBCUTANEOUS | 28 days supply | Qty: 2 | Fill #4

## 2022-11-07 MED ORDER — AMLODIPINE 10 MG TABLET
ORAL_TABLET | 3 refills | 0 days | Status: CP
Start: 2022-11-07 — End: ?

## 2022-11-07 NOTE — Unmapped (Signed)
Refill request received for patient.      Medication Requested: Amlodipine  Last Office Visit: 09/04/2022  Next Office Visit: Visit date not found  Last Prescriber: Willene Hatchet    Nurse refill requirements met? No  If not met, why: No follow up scheduled    Sent to: Pharmacy per protocol  If sent to provider, which provider?:

## 2022-11-21 NOTE — Unmapped (Signed)
Dale Webb requested a refill of their Repatha Sureclick via IVR. The [facility] has scheduled delivery per the patients request via UPS to be delivered to their prescription address on 11/27/22.

## 2022-11-26 MED FILL — REPATHA SURECLICK 140 MG/ML SUBCUTANEOUS PEN INJECTOR: SUBCUTANEOUS | 28 days supply | Qty: 2 | Fill #5

## 2022-12-17 DIAGNOSIS — E785 Hyperlipidemia, unspecified: Principal | ICD-10-CM

## 2022-12-17 DIAGNOSIS — I2510 Atherosclerotic heart disease of native coronary artery without angina pectoris: Principal | ICD-10-CM

## 2022-12-17 DIAGNOSIS — Z789 Other specified health status: Principal | ICD-10-CM

## 2022-12-17 MED ORDER — REPATHA SURECLICK 140 MG/ML SUBCUTANEOUS PEN INJECTOR
SUBCUTANEOUS | 1 refills | 84 days
Start: 2022-12-17 — End: ?

## 2022-12-18 MED ORDER — REPATHA SURECLICK 140 MG/ML SUBCUTANEOUS PEN INJECTOR
SUBCUTANEOUS | 1 refills | 84 days | Status: CP
Start: 2022-12-18 — End: ?
  Filled 2022-12-22: qty 6, 84d supply, fill #0

## 2022-12-18 NOTE — Unmapped (Signed)
Dale Webb requested a refill of their Repatha via IVR/Web. The [facility] has scheduled delivery per the patients request via UPS to be delivered to their prescription address on 12/23/22.

## 2022-12-29 ENCOUNTER — Ambulatory Visit: Admit: 2022-12-29 | Discharge: 2022-12-30 | Payer: MEDICARE

## 2022-12-29 ENCOUNTER — Ambulatory Visit
Admit: 2022-12-29 | Discharge: 2022-12-30 | Payer: MEDICARE | Attending: Foot and Ankle Surgery | Primary: Foot and Ankle Surgery

## 2022-12-29 NOTE — Unmapped (Signed)
Orthopaedic Foot and Ankle Division  Encounter Provider: [name], MD  Date of Service: 12/29/2022 Last encounter Orthopaedics: 09/30/2022   Last encounter this provider: 09/30/2022      Notes:      MDM (Two highest determine E&M code)   Problems 1 stable chronic illness [phone], [id number])   Data Independent interpretation of imaging (99204/99214)   Risk         Primary Care Provider: [name]  Referring Provider: [name]    ICD-10-CM   1. 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 Arthritis of right midfoot  M19.071    Orthopaedic notes: Right modified triple arthrodesis (subtalar, calcaneocuboid, and naviculocuneiform fusions, with previously fused TN joint), with DBX allograft in fusion sites; fixation with screws and staples Dale Webb 07-23-22)    Physical Function CAT Score: (not recorded)  Pain Interference CAT Score: (not recorded)  Depression CAT Score: (not recorded)  Sleep CAT Score: (not recorded)  [name]     Dale Webb is a 67 y.o. male   ASSESSMENT   Overall doing well, mild lateral hindfoot, ankle, and plantar heel symptoms        PLAN:       Activity as tolerated.  Return to see me if symptoms are not tolerable in about 6 months.      Scheduling Notes:  Scheduled With [name], MD: Visit date not found  With Orthopaedics: Visit date not found    - Xrays next visit in same ortho team: none       Requested Prescriptions      No prescriptions requested or ordered in this encounter      Orders Placed This Encounter   Procedures    XR Ankle Arthritis Series Right       History:  (Data reviewed and verified by encounter provider.)  Chief Complaint   Patient presents with    Right Ankle - Follow-up     HPI:  67 y.o. male who is overall doing well; able to play golf once a week, without significant symptoms the next day.  Was able to wear cowboy boots for the first time in a while recently.  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 A diagnosis of Arthritis of right midfoot was also pertinent to this visit.   Estimated body mass index is 26.82 kg/m?? as calculated from the following:    Height as of 07/23/22: 182.9 cm (6').    Weight as of 07/23/22: 89.7 kg (197 lb 12 oz).     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 A diagnosis of Arthritis of right midfoot was also pertinent to this visit.  No results found for: A1C    No results found for: VITD    Imaging  Orders Placed This Encounter   Procedures    XR Ankle Arthritis Series Right  Radiology studies were ordered and personally reviewed and interpreted by the encounter provider today..  Well aligned with hardware in appropriate position.  Evidence of early fusions.      DME ORDER:  Dx:  ,

## 2023-01-13 ENCOUNTER — Ambulatory Visit: Payer: Self-pay | Admitting: Surgery

## 2023-01-13 NOTE — Unmapped (Signed)
[facility] Cardiology Pre-Op Risk Assessment    Patient Name: Dale Webb  Patient DOB: March 11, 1956  Date Request Received: 01/13/23  Procedure Requested: excision of lipoma  Date of Procedure: 01/30/23  Requesting Provider: Dr Tonna Boehringer, MD  Return Contact Info: [phone]  Last Cardiology Visit: 09/04/22    Completed per Dr Jaymes Graff MD 01/14/23 scanned in media

## 2023-01-13 NOTE — H&P (View-Only) (Signed)
Subjective:    CC: Lipoma of abdominal wall [D17.1] and left arm   HPI:   Subjective Curtis Sherman is a 67 y.o. male who was referred by Jamelle Haring * for evaluation of above. First noted several years ago.  Painful to touch.   Past Medical History:  has a past medical history of Chest pain, Chronic back pain, Diabetes mellitus type 2, uncomplicated (CMS/HHS-HCC), GERD (gastroesophageal reflux disease), Hematuria, History of kidney stones, HTN (hypertension), and Hyperlipidemia.   Past Surgical History:  has a past surgical history that includes NISSEN FUNDOPLICATION; RIGHT BONY FUSION ON THE RIGHT FOOT (1982); Colonoscopy (10/02/1988); Colonoscopy (09/20/1996); Colonoscopy (02/12/2001); Colonoscopy (11/20/2010); egd (01/08/2005, 02/12/2001, 09/20/1996, 10/02/1988); egd (11/20/2010); egd (04/02/2012); egd (12/25/2014); Colonoscopy (01/15/2016); Appendectomy; Hernia repair; Back surgery (2009); and Colonoscopy (10/14/2021).   Family History: family history includes Bone cancer in his father; Cancer in an other family member; Colon cancer in his father; Diabetes in his maternal grandmother; High blood pressure (Hypertension) in his mother; Lung cancer in his father.   Social History:  reports that he quit smoking about 5 years ago. His smoking use included cigars and cigarettes. He has never used smokeless tobacco. He reports that he does not drink alcohol and does not use drugs.   Current Medications: has a current medication list which includes the following prescription(s): acetaminophen, amlodipine, clopidogrel, cyclobenzaprine, repatha sureclick, ezetimibe, lactobacillus rhamnosus (gg), losartan, mv-min/folic/k1/lycopen/lutein, oxycodone, oxycodone-acetaminophen, pantoprazole, and pioglitazone.   Allergies:  Allergies       Allergies  Allergen Reactions   Codeine Unknown      Gi upset    Lisinopril Cough        ROS:  A 15 point review of systems was performed and  pertinent positives and negatives noted in HPI   Objective:      Objective BP (!) 152/68   Pulse 58   Ht 182.9 cm (6' 0.01")   Wt 94.3 kg (207 lb 14.3 oz)   BMI 28.19 kg/m    Constitutional :  No distress, cooperative, alert  Lymphatics/Throat:  Supple with no lymphadenopathy  Respiratory:  Clear to auscultation bilaterally  Cardiovascular:  Regular rate and rhythm  Gastrointestinal: Soft, non-tender, non-distended, no organomegaly.  Musculoskeletal: Steady gait and movement  Skin: Cool and moist, lipoma noted in left abdomen along rectus x2.  Left upper arm lipoma near lateral elbow.  See pics below.  Psychiatric: Normal affect, non-agitated, not confused            LABS:  N/a    RADS: N/a   Assessment:        Assessment Lipoma of abdominal wall [D17.1] and left arm.  Pt requesting removal of the three above only.     Plan:        Plan 1. Lipoma of abdominal wall [D17.1] Discussed surgical excision.  Alternatives include continued observation.  Benefits include possible symptom relief, pathologic evaluation, improved cosmesis. Discussed the risk of surgery including recurrence, chronic pain, post-op infxn, poor cosmesis, poor/delayed wound healing, and possible re-operation to address said risks. The risks of general anesthetic, if used, includes MI, CVA, sudden death or even reaction to anesthetic medications also discussed.  Typical post-op recovery time of 3-5 days with possible activity restrictions were also discussed.   The patient verbalized understanding and all questions were answered to the patient's satisfaction.   2. Patient has elected to proceed with surgical treatment. Procedure will be scheduled. In OR due to number and use of plavix.  Hold plavix 5days prior if possible   labs/images/medications/previous chart entries reviewed personally and relevant changes/updates noted above.

## 2023-01-13 NOTE — H&P (Addendum)
Subjective:    CC: Lipoma of abdominal wall [D17.1] and left arm   HPI:   Subjective Curtis Sherman is a 67 y.o. male who was referred by Jamelle Haring * for evaluation of above. First noted several years ago.  Painful to touch.   Past Medical History:  has a past medical history of Chest pain, Chronic back pain, Diabetes mellitus type 2, uncomplicated (CMS/HHS-HCC), GERD (gastroesophageal reflux disease), Hematuria, History of kidney stones, HTN (hypertension), and Hyperlipidemia.   Past Surgical History:  has a past surgical history that includes NISSEN FUNDOPLICATION; RIGHT BONY FUSION ON THE RIGHT FOOT (1982); Colonoscopy (10/02/1988); Colonoscopy (09/20/1996); Colonoscopy (02/12/2001); Colonoscopy (11/20/2010); egd (01/08/2005, 02/12/2001, 09/20/1996, 10/02/1988); egd (11/20/2010); egd (04/02/2012); egd (12/25/2014); Colonoscopy (01/15/2016); Appendectomy; Hernia repair; Back surgery (2009); and Colonoscopy (10/14/2021).   Family History: family history includes Bone cancer in his father; Cancer in an other family member; Colon cancer in his father; Diabetes in his maternal grandmother; High blood pressure (Hypertension) in his mother; Lung cancer in his father.   Social History:  reports that he quit smoking about 5 years ago. His smoking use included cigars and cigarettes. He has never used smokeless tobacco. He reports that he does not drink alcohol and does not use drugs.   Current Medications: has a current medication list which includes the following prescription(s): acetaminophen, amlodipine, clopidogrel, cyclobenzaprine, repatha sureclick, ezetimibe, lactobacillus rhamnosus (gg), losartan, mv-min/folic/k1/lycopen/lutein, oxycodone, oxycodone-acetaminophen, pantoprazole, and pioglitazone.   Allergies:  Allergies       Allergies  Allergen Reactions   Codeine Unknown      Gi upset    Lisinopril Cough        ROS:  A 15 point review of systems was performed and  pertinent positives and negatives noted in HPI   Objective:      Objective BP (!) 152/68   Pulse 58   Ht 182.9 cm (6' 0.01")   Wt 94.3 kg (207 lb 14.3 oz)   BMI 28.19 kg/m    Constitutional :  No distress, cooperative, alert  Lymphatics/Throat:  Supple with no lymphadenopathy  Respiratory:  Clear to auscultation bilaterally  Cardiovascular:  Regular rate and rhythm  Gastrointestinal: Soft, non-tender, non-distended, no organomegaly.  Musculoskeletal: Steady gait and movement  Skin: Cool and moist, lipoma noted in left abdomen along rectus x2.  Left upper arm lipoma near lateral elbow.  See pics below.  Psychiatric: Normal affect, non-agitated, not confused            LABS:  N/a    RADS: N/a   Assessment:        Assessment Lipoma of abdominal wall [D17.1] and left arm.  Pt requesting removal of the three above only.     Plan:        Plan 1. Lipoma of abdominal wall [D17.1] Discussed surgical excision.  Alternatives include continued observation.  Benefits include possible symptom relief, pathologic evaluation, improved cosmesis. Discussed the risk of surgery including recurrence, chronic pain, post-op infxn, poor cosmesis, poor/delayed wound healing, and possible re-operation to address said risks. The risks of general anesthetic, if used, includes MI, CVA, sudden death or even reaction to anesthetic medications also discussed.  Typical post-op recovery time of 3-5 days with possible activity restrictions were also discussed.   The patient verbalized understanding and all questions were answered to the patient's satisfaction.   2. Patient has elected to proceed with surgical treatment. Procedure will be scheduled. In OR due to number and use of plavix.  Hold plavix 5days prior if possible   labs/images/medications/previous chart entries reviewed personally and relevant changes/updates noted above.

## 2023-01-16 MED ORDER — EZETIMIBE 10 MG TABLET
ORAL_TABLET | 2 refills | 0 days | Status: CP
Start: 2023-01-16 — End: ?

## 2023-01-16 NOTE — Unmapped (Signed)
Refill request received for patient.      Medication Requested: ezetimibe  Last Office Visit: 09/04/2022 Orlie Dakin)   Next Office Visit: 04/23/2023 Orlie Dakin)  Last Prescriber: Willene Hatchet    Nurse refill requirements met? Yes  If not met, why:     Sent to: Pharmacy per protocol  If sent to provider, which provider?:

## 2023-01-22 ENCOUNTER — Encounter
Admission: RE | Admit: 2023-01-22 | Discharge: 2023-01-22 | Disposition: A | Discharge status: Home / Self Care | Payer: Medicare HMO | Source: Ambulatory Visit | Attending: Surgery | Admitting: Surgery

## 2023-01-22 VITALS — Ht 72.0 in | Wt 198.0 lb

## 2023-01-22 DIAGNOSIS — Z01812 Encounter for preprocedural laboratory examination: Secondary | ICD-10-CM

## 2023-01-22 DIAGNOSIS — I10 Essential (primary) hypertension: Secondary | ICD-10-CM

## 2023-01-22 DIAGNOSIS — Z951 Presence of aortocoronary bypass graft: Secondary | ICD-10-CM

## 2023-01-22 NOTE — Patient Instructions (Addendum)
Your procedure is scheduled on: Friday, August 2 Report to the Registration Desk on the 1st floor of the [facility] To find out your arrival time, please call [phone] between 1PM - 3PM on: Thursday, August 1 If your arrival time is 6:00 am, do not arrive before that time as the [facility] entrance doors do not open until 6:00 am.  REMEMBER: Instructions that are not followed completely may result in serious medical risk, up to and including death; or upon the discretion of your surgeon and anesthesiologist your surgery may need to be rescheduled.  Do not eat food after midnight the night before surgery.  No gum chewing or hard candies.  You may however, drink water up to 2 hours before you are scheduled to arrive for your surgery. Do not drink anything within 2 hours of your scheduled arrival time.  One week prior to surgery: starting July 26 Stop Anti-inflammatories (NSAIDS) such as Advil, Aleve, Ibuprofen, Motrin, Naproxen, Naprosyn and Aspirin based products such as Excedrin, Goody's Powder, BC Powder. Stop ANY OVER THE COUNTER supplements until after surgery. Stop Neuriva and multiple vitamins.  You may however, continue to take Tylenol if needed for pain up until the day of surgery.  Continue taking all prescribed medications with the exception of the following:  Clopidogrel (Plavix) - hold for 5 days before surgery. Last day to take is Saturday, July 27. Resume AFTER surgery per surgeon instruction.  TAKE ONLY THESE MEDICATIONS THE MORNING OF SURGERY WITH A SIP OF WATER:  Amlodipine Ezetimibe (Zetia) Pantoprazole (Protonix) - (take one the night before and one on the morning of surgery - helps to prevent nausea after surgery.)  No Alcohol for 24 hours before or after surgery.  No Smoking including e-cigarettes for 24 hours before surgery.  No chewable tobacco products for at least 6 hours before surgery.  No nicotine patches on the day of surgery.  Do not use any  "recreational" drugs for at least a week (preferably 2 weeks) before your surgery.  Please be advised that the combination of cocaine and anesthesia may have negative outcomes, up to and including death. If you test positive for cocaine, your surgery will be cancelled.  On the morning of surgery brush your teeth with toothpaste and water, you may rinse your mouth with mouthwash if you wish. Do not swallow any toothpaste or mouthwash.  Use CHG Soap as directed on instruction sheet.  Do not wear jewelry, make-up, hairpins, clips or nail polish.  Do not wear lotions, powders, or perfumes.   Do not shave body hair from the neck down 48 hours before surgery.  Contact lenses, hearing aids and dentures may not be worn into surgery.  Do not bring valuables to the hospital. [facility] is not responsible for any missing/lost belongings or valuables.   Notify your doctor if there is any change in your medical condition (cold, fever, infection).  Wear comfortable clothing (specific to your surgery type) to the hospital.  After surgery, you can help prevent lung complications by doing breathing exercises.  Take deep breaths and cough every 1-2 hours.   If you are being discharged the day of surgery, you will not be allowed to drive home. You will need a responsible individual to drive you home and stay with you for 24 hours after surgery.   If you are taking public transportation, you will need to have a responsible individual with you.  Please call the Pre-admissions Testing Dept. at [phone]  if you have any questions about these instructions.  Surgery Visitation Policy:  Patients having surgery or a procedure may have two visitors.  Children under the age of 25 must have an adult with them who is not the patient.     Preparing for Surgery with CHLORHEXIDINE GLUCONATE (CHG) Soap  Chlorhexidine Gluconate (CHG) Soap  o An antiseptic cleaner that kills germs and bonds with the  skin to continue killing germs even after washing  o Used for showering the night before surgery and morning of surgery  Before surgery, you can play an important role by reducing the number of germs on your skin.  CHG (Chlorhexidine gluconate) soap is an antiseptic cleanser which kills germs and bonds with the skin to continue killing germs even after washing.  Please do not use if you have an allergy to CHG or antibacterial soaps. If your skin becomes reddened/irritated stop using the CHG.  1. Shower the NIGHT BEFORE SURGERY and the MORNING OF SURGERY with CHG soap.  2. If you choose to wash your hair, wash your hair first as usual with your normal shampoo.  3. After shampooing, rinse your hair and body thoroughly to remove the shampoo.  4. Use CHG as you would any other liquid soap. You can apply CHG directly to the skin and wash gently with a scrungie or a clean washcloth.  5. Apply the CHG soap to your body only from the neck down. Do not use on open wounds or open sores. Avoid contact with your eyes, ears, mouth, and genitals (private parts). Wash face and genitals (private parts) with your normal soap.  6. Wash thoroughly, paying special attention to the area where your surgery will be performed.  7. Thoroughly rinse your body with warm water.  8. Do not shower/wash with your normal soap after using and rinsing off the CHG soap.  9. Pat yourself dry with a clean towel.  10. Wear clean pajamas to bed the night before surgery.  12. Place clean sheets on your bed the night of your first shower and do not sleep with pets.  13. Shower again with the CHG soap on the day of surgery prior to arriving at the hospital.  14. Do not apply any deodorants/lotions/powders.  15. Please wear clean clothes to the hospital.

## 2023-01-23 ENCOUNTER — Encounter
Admission: RE | Admit: 2023-01-23 | Discharge: 2023-01-23 | Disposition: A | Discharge status: Home / Self Care | Payer: Medicare HMO | Source: Ambulatory Visit | Attending: Surgery | Admitting: Surgery

## 2023-01-23 DIAGNOSIS — Z951 Presence of aortocoronary bypass graft: Secondary | ICD-10-CM | POA: Diagnosis not present

## 2023-01-23 DIAGNOSIS — I10 Essential (primary) hypertension: Secondary | ICD-10-CM | POA: Diagnosis not present

## 2023-01-23 DIAGNOSIS — Z01818 Encounter for other preprocedural examination: Secondary | ICD-10-CM | POA: Insufficient documentation

## 2023-01-23 DIAGNOSIS — Z01812 Encounter for preprocedural laboratory examination: Secondary | ICD-10-CM

## 2023-01-23 LAB — CBC
HCT: 45.1 % (ref 39.0–52.0)
Hemoglobin: 14.9 g/dL (ref 13.0–17.0)
MCH: 30.8 pg (ref 26.0–34.0)
MCHC: 33 g/dL (ref 30.0–36.0)
MCV: 93.4 fL (ref 80.0–100.0)
Platelets: 145 10*3/uL — ABNORMAL LOW (ref 150–400)
RBC: 4.83 MIL/uL (ref 4.22–5.81)
RDW: 13 % (ref 11.5–15.5)
WBC: 3.8 10*3/uL — ABNORMAL LOW (ref 4.0–10.5)
nRBC: 0 % (ref 0.0–0.2)

## 2023-01-26 ENCOUNTER — Encounter: Payer: Self-pay | Admitting: Surgery

## 2023-01-27 ENCOUNTER — Encounter: Payer: Self-pay | Admitting: Surgery

## 2023-01-27 NOTE — Progress Notes (Signed)
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 Clinical nursing staff has updated patient's PMH/PSHx, current medication list, and drug allergies/intolerances to ensure comprehensive history available to assist in medical decision making as it pertains to the aforementioned surgical procedure and anticipated anesthetic course. Extensive review of available clinical information personally performed. [place] PMH and PSHx updated with any diagnoses/procedures that  may have been inadvertently omitted during his intake with the pre-admission testing department's nursing staff.  Clinical Discussion:  Curtis Sherman is a 67 y.o. male who is submitted for pre-surgical anesthesia review and clearance prior to him undergoing the above procedure. Patient is a Former Smoker (64.5 pack years; quit 06/2013). Pertinent PMH includes: CAD (s/p CABG), inferior STEMI, PVD, BILATERAL carotid artery stenosis, aortic atherosclerosis, angina HTN, HLD, T2DM, OSAH (requires nocturnal PAP therapy), GERD (on daily PPI), OA, nephrolithiasis, abdominal lipoma.  Patient is followed by cardiology Jaymes Graff, MD). He was last seen in the cardiology clinic on 09/04/2022; notes reviewed. At the time of his clinic visit, patient doing well overall from a cardiovascular perspective. Patient denied any chest pain,  shortness of breath, PND, orthopnea, palpitations, significant peripheral edema, weakness, fatigue, vertiginous symptoms, or presyncope/syncope. Patient with a past medical history significant for cardiovascular diagnoses. Documented physical exam was grossly benign, providing no evidence of acute exacerbation and/or decompensation of the patient's known cardiovascular conditions.  Patient suffered an acute inferior STEMI on 06/17/2017.  Diagnostic LEFT heart catheterization was performed revealing multivessel CAD; 95% distal RCA, 60% proximal RCA, 50% proximal to mid RCA, 50% mid RCA, 100% ostial OM1, 75 ostial to proximal LAD and 80% ostial D1.  Given the degree and complexity of patient's coronary artery disease, he was referred to CVTS for consideration of revascularization.  Patient underwent four-vessel revascularization procedure on 06/19/2017.  LIMA-LAD, SVG-D1, SVG-OM1, and SVG-RCA bypass grafts were placed.  Patient experiencing recurrent unstable angina he underwent repeat diagnostic LEFT heart catheterization on 04/22/2018 revealing multivessel CAD; 50% LM, 80% ostial D1, 50% mid LAD, and a subtotal occlusion of the RCA.  LIMA-LAD bypass graft with 25% proximal stenosis.  SVG-RCA subtotally occluded.  SVG-OM and SVG-diagonal bypass grafts occluded.  PCI was subsequently performed placing a 3.5 x 28 mm Xience DES to the mid RCA and a 2.5 x 33 mm Xience DES to the distal RCA.  Procedure yielded excellent angiographic result and TIMI-3 flow.  Patient found to have significant stenoses in his BILATERAL iliac arteries.  He underwent PTA and stenting on 08/02/2018.  LEFT common iliac artery with an approximate 80% stenosis. RIGHT common iliac artery noted to have an approximate 60-70% stenosis. 9 x 19 mm Omnilink Elite balloon expanding stent (LEFT CIA) and 10 x 60 mm Protege self-expanding stent (RIGHT CIA).  Most recent myocardial perfusion imaging study was performed on 10/23/2021 revealing  a post  stress ejection fraction of 59%.  There were significant coronary artery and aortic calcifications noted on the attenuation correction CT's.  Post CABG changes present.  Moderate reversible perfusion abnormality involving the apical lateral, mid anterolateral, and basal anterolateral segments was noted.  Findings were consistent with probable ischemia, however artifact cannot be excluded.  Study determined to be abnormal.  Again, patient with recurrent unstable angina necessitating repeat cardiac catheterization.  He underwent diagnostic LEFT heart catheterization on 11/06/2021 revealing multivessel CAD; patient with in-stent restenosis of the previously placed mid RCA (60%) and distal RCA (70%) stents.  Additionally, there was 80% proximal LAD, 95% D1, an occluded mid LAD, and an occluded OM1.  Previously placed saphenous vein grafts x 3 all chronically occluded.  Patient underwent POBA to mid RCA, distal RCA, and proximal LAD yielding angiographic improvement and TIMI-3 flow.  Decision was made not to stent the proximal LAD due to its proximity to the origin of D1.  Most recent TTE was performed on 11/15/2021 revealing a normal left ventricular systolic function with an EF of >55%.  There were no regional wall motion abnormalities. Left ventricular diastolic Doppler parameters consistent with abnormal relaxation (G1DD).  GLS -19.6%. There was mild mitral annular calcifications.  There was mild biatrial dilatation.  Right ventricular size and function normal.  Pulmonary artery mildly dilated.  All transvalvular gradients were noted to be normal providing no evidence suggestive of valvular stenosis.  Aorta normal in size with no evidence of aneurysmal dilatation.  Given patient's cardiovascular history and stenting of both his coronary and peripheral arteries, patient remains on daily antithrombotic therapy using clopidogrel.  Patient is reportedly compliant with therapy with no evidence or reports of GI  bleeding.  Blood pressure reasonably controlled at 138/74 mmHg on currently prescribed CCB (amlodipine) and ARB (losartan) therapies.  Patient is on PCSK9i (evolocumab) + ezetimibe for his HLD diagnosis and ASCVD prevention. T2DM well controlled on currently prescribed regimen; last HgbA1c was 6.7% when checked on 06/24/2022.  Of note, A1c has been rechecked since patient was last seen by cardiology with further improvement to 6.6% when checked on 01/06/2023.  Patient does have an OSAH diagnosis and is reported to be compliant with prescribed nocturnal PAP therapy.  Functional capacity limited by recent podiatric/orthopedic surgery requiring patient to be in cast and ambulate with crutches.  He is able to complete all of his ADLs/IADLs without significant cardiovascular limitation.  Per the [facility], patient able to complete at least 4 METS of physical activity without experiencing any significant degrees of angina/anginal equivalent symptoms. No changes were made to his medication regimen during his visit with cardiology.  Patient scheduled to follow-up with outpatient cardiology in 12 months or sooner if needed.  Curtis Sherman is scheduled for an EXCISION LIPOMA (Abdomen); EXCISION MASS (Left: Arm Upper) on 01/30/2023 with Dr. Sung Amabile, DO. Given patient's past medical history significant for cardiovascular diagnoses, presurgical cardiac clearance was sought by the PAT team.  Per cardiology, patient has a history of severe coronary artery disease, which places him at increased risk for complications.  With that said, patient may proceed with the planned procedural course at an overall MODERATE risk without the need for further cardiovascular testing".  In review of his medication reconciliation, it is noted that patient is currently on prescribed daily antithrombotic therapy. He has been instructed on recommendations for holding his clopidogrel for 5 days prior to his procedure with plans to restart as soon  as postoperative bleeding  risk felt to be minimized by his attending surgeon. The patient has been instructed that his last dose of his clopidogrel should be on 01/24/2023.  Patient denies previous perioperative complications with anesthesia in the past. In review of the available records, it is noted that patient underwent a general + regional anesthetic course at [facility] ([facility] III) in 06/2022 without documented complications.      01/22/2023    3:34 PM 10/14/2021   10:49 AM 10/14/2021   10:34 AM  Vitals with BMI  Height 6\' 0"     Weight 198 lbs    BMI 26.85    Systolic  142 120  Diastolic  71 75  Pulse  51 55    Providers/Specialists:   NOTE: Primary physician provider listed below. Patient may have been seen by APP or partner within same practice.   PROVIDER ROLE / SPECIALTY LAST Michelle Nasuti, DO General Surgery (Surgeon) 01/13/2023  Marina Goodell, MD Primary Care Provider 01/06/2023  Willene Hatchet, MD Cardiology 09/04/2022   Allergies:  Codeine and Lisinopril  Current Home Medications:   No current facility-administered medications for this encounter.    acetaminophen (TYLENOL) 325 MG tablet   amLODipine (NORVASC) 10 MG tablet   cephALEXin (KEFLEX) 500 MG capsule   clopidogrel (PLAVIX) 75 MG tablet   cyclobenzaprine (FLEXERIL) 5 MG tablet   Evolocumab (REPATHA) 140 MG/ML SOSY   ezetimibe (ZETIA) 10 MG tablet   Lactobacillus Rhamnosus, GG, (CULTURELLE PO)   losartan (COZAAR) 50 MG tablet   Multiple Vitamins-Minerals (CENTRUM SILVER 50+MEN PO)   OVER THE COUNTER MEDICATION   oxyCODONE-acetaminophen (PERCOCET/ROXICET) 5-325 MG tablet   pantoprazole (PROTONIX) 40 MG tablet   pioglitazone (ACTOS) 30 MG tablet   History:   Past Medical History:  Diagnosis Date   Abdominal lipoma    Acute ST elevation myocardial infarction (STEMI) of inferior wall (HCC) 06/17/2017   a.) severe CAD --> CVTS referral --> 4v  revascularization 06/19/2017   Anginal pain (HCC)    Aortic atherosclerosis (HCC)    Arthritis of right foot    Carotid artery disease (HCC) 06/18/2017   a.) carotid doppler 06/18/2017: 1-39% BICA   Chronic back pain    Coronary artery disease    a.) LHC 06/17/17: 95% dRCA, 60% pRCA, 50% p-mRCA, 50% mRCA, 100% oOM1, 75% o-pLAD, 80% oD1 -> CVTS; b.) 4v CABG 06/19/17; c.) LHC/PCI 04/22/18: 50% LM, 80% oD1, 50% mLAD, subtot occ RCA (3.5x28 & 2.5x33 mm Xience DES m-dRCA), SVG-RCA subtot occ, SVG-OM1 & SVG-D1 occ, 25% pLIMA-LAD; d.) LHC 11/07/21: 60% mRCA ISR (POBA), 70% dRCA ISR (POBA), 80% pLAD (POBA), 95% D1, occ mLAD, occ OM1. SVGs x 3 occ   Diabetes mellitus without complication (HCC)    Diastolic dysfunction 11/15/2021   a.) TTE 11/15/2021: EF >55%, mild biatrial dil, mild MAC, PA mildly dil, G1DD   GERD (gastroesophageal reflux disease)    a.) s/p Nissen fundoplication   History of 2019 novel coronavirus disease (COVID-19) 03/13/2020   History of bilateral cataract extraction 2020   History of kidney stones    Hyperlipidemia    Hypertension    Iliac artery stenosis, bilateral (HCC)    a.) s/p PTA and stenting 08/02/2018: ~80% LEFT CIA (9x19 mm Omnilink Elite balloon expanding stent), ~60-70% RIGHT CIA (10x60 mm Protege self expanding stent)   Kidney stones    Long term current use of antithrombotics/antiplatelets    a.) clopidogrel   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  a.) s/p discectomy for HNP 02/10/2008; b.) s/p lumbar laminectomy for discectomy L5-S1 10/29/2010   OSA on CPAP    Peripheral vascular disease (HCC)    Pneumonia due to COVID-19 virus 02/2020   S/P CABG x 4 06/19/2017   a.) LIMA-LAD, SVG-D1, SVG-OM1, SVG-RCA   Thrombocytopenia (HCC)    Wears dentures    full upper, partial lower   Past Surgical History:  Procedure Laterality Date   APPENDECTOMY     bony fusion Right 1982   foot   CATARACT EXTRACTION W/PHACO Right 06/06/2019   Procedure: CATARACT EXTRACTION PHACO  AND INTRAOCULAR LENS PLACEMENT (IOC) RIGHT DIABETIC, 1.06, 00:18.3;  Surgeon: Nevada Crane, MD;  Location: [facility];  Service: Ophthalmology;  Laterality: Right;  Diabetic - oral meds   CATARACT EXTRACTION W/PHACO Left 06/27/2019   Procedure: CATARACT EXTRACTION PHACO AND INTRAOCULAR LENS PLACEMENT (IOC) LEFT DIABETIC 1.84,  00:23.0;  Surgeon: Nevada Crane, MD;  Location: [facility];  Service: Ophthalmology;  Laterality: Left;  Diabetic - oral meds   COLONOSCOPY     1990, 1998, 2002, 2012, 2017   COLONOSCOPY WITH PROPOFOL N/A 10/14/2021   Procedure: COLONOSCOPY WITH PROPOFOL;  Surgeon: Regis Bill, MD;  Location: [facility];  Service: Endoscopy;  Laterality: N/A;  DM   CORONARY ANGIOPLASTY  11/06/2021   Procedure: CORONARY ANGIOPLASTY; Location: [facility]; Surgeon: Willene Hatchet, MD   CORONARY ANGIOPLASTY WITH STENT PLACEMENT Left 04/22/2018   Procedure: CORONARY ANGIOPLASTY WITH STENT PLACEMENT   CORONARY ARTERY BYPASS GRAFT N/A 06/19/2017   Procedure: CORONARY ARTERY BYPASS GRAFTING (CABG) x 3 WITH RIGHT ENDOVEIN HARVESTING;  Surgeon: Delight Ovens, MD;  Location: [facility] OR;  Service: Open Heart Surgery;  Laterality: N/A;   CORONARY/GRAFT ACUTE MI REVASCULARIZATION N/A 06/17/2017   Procedure: Coronary Angiography;  Surgeon: Marcina Millard, MD;  Location: [facility] INVASIVE CV LAB;  Service: Cardiovascular;  Laterality: N/A;   ESOPHAGOGASTRODUODENOSCOPY     1990, 1998, 2002, 2006, 2012, 2016   ESOPHAGOGASTRODUODENOSCOPY (EGD) WITH PROPOFOL N/A 12/25/2014   Procedure: ESOPHAGOGASTRODUODENOSCOPY (EGD) WITH PROPOFOL;  Surgeon: Scot Jun, MD;  Location: [facility] ENDOSCOPY;  Service: Endoscopy;  Laterality: N/A;   FOOT ARTHRODESIS, SUBTALAR Right 07/23/2022   Procedure: PR FUSION FOOT BONES, SUBTALAR PR FUSION FOOT BONES, MIDTARSAL, MULTI SUBTALAR ARTHRODESIS ARTHRODESIS MIDTARS/TARSOMETAT MX/TRANSVERSE; Location: [facility]   HERNIA REPAIR     x3   ILIAC  ARTERY STENT Bilateral 08/02/2018   Procedure: ILIAC ARTERY STENT; Location: [facility]; Surgeon: Willene Hatchet, MD   LEFT HEART CATH AND CORONARY ANGIOGRAPHY N/A 06/17/2017   Procedure: LEFT HEART CATH;  Surgeon: Marcina Millard, MD;  Location: [facility] INVASIVE CV LAB;  Service: Cardiovascular;  Laterality: N/A;   LUMBAR DISC SURGERY  02/10/2008   LUMBAR LAMINECTOMY/DECOMPRESSION MICRODISCECTOMY  10/29/2010   NISSEN FUNDOPLICATION     Family History  Problem Relation Age of Onset   Hypertension Mother    Bone cancer Father    Colon cancer Father    Lung cancer Father    Heart attack Brother    Heart disease Brother    Social History   Tobacco Use   Smoking status: Former    Current packs/day: 0.00    Average packs/day: 1.5 packs/day for 43.0 years (64.5 ttl pk-yrs)    Types: Cigars, Cigarettes    Start date: 07/30/1970    Quit date: 07/30/2013    Years since quitting: 9.5   Smokeless tobacco: Never  Vaping Use   Vaping status: Never Used  Substance Use Topics  Alcohol use: No   Drug use: No    Pertinent Clinical Results:  LABS:   No visits with results within 3 Day(s) from this visit.  Latest known visit with results is:  Hospital Outpatient Visit on 01/23/2023  Component Date Value Ref Range Status   WBC 01/23/2023 3.8 (L)  4.0 - 10.5 K/uL Final   RBC 01/23/2023 4.83  4.22 - 5.81 MIL/uL Final   Hemoglobin 01/23/2023 14.9  13.0 - 17.0 g/dL Final   HCT 16/03/9603 45.1  39.0 - 52.0 % Final   MCV 01/23/2023 93.4  80.0 - 100.0 fL Final   MCH 01/23/2023 30.8  26.0 - 34.0 pg Final   MCHC 01/23/2023 33.0  30.0 - 36.0 g/dL Final   RDW 54/02/8118 13.0  11.5 - 15.5 % Final   Platelets 01/23/2023 145 (L)  150 - 400 K/uL Final   nRBC 01/23/2023 0.0  0.0 - 0.2 % Final   Performed at [facility], [address] [place], [place] [address]   Component Ref Range & Units 01/06/2023  Glucose 70 - 110 mg/dL 956 High   Sodium 213 - 145 mmol/L 140  Potassium 3.6 - 5.1  mmol/L 4.5  Chloride 97 - 109 mmol/L 106  Carbon Dioxide (CO2) 22.0 - 32.0 mmol/L 29.2  Urea Nitrogen (BUN) 7 - 25 mg/dL 14  Creatinine 0.7 - 1.3 mg/dL 0.7  Glomerular Filtration Rate (eGFR) >60 mL/min/1.73sq m 100  Calcium 8.7 - 10.3 mg/dL 08.6  AST 8 - 39 U/L 22  ALT 6 - 57 U/L 21  Alk Phos (alkaline Phosphatase) 34 - 104 U/L 117 High   Albumin 3.5 - 4.8 g/dL 4.3  Bilirubin, Total 0.3 - 1.2 mg/dL 0.6  Protein, Total 6.1 - 7.9 g/dL 6.4  A/G Ratio 1.0 - 5.0 gm/dL 2.0  Resulting Agency [facility]  Specimen Collected: 01/06/23 09:21   Performed by: [facility] Last Resulted: 01/06/23 16:31  Received From: [facility]  Result Received: 01/18/23 19:09   Component Ref Range & Units 01/06/2023  Hemoglobin A1C 4.2 - 5.6 % 6.6 High   Average Blood Glucose (Calc) mg/dL 578  Resulting Agency [facility]  Narrative Performed by [facility] Normal Range:    4.2 - 5.6% Increased Risk:  5.7 - 6.4% Diabetes:        >= 6.5% Glycemic Control for adults with diabetes:  <7%   Specimen Collected: 01/06/23 09:21   Performed by: [facility] Last Resulted: 01/06/23 12:36  Received From: [facility]  Result Received: 01/18/23 19:09    ECG: Date: 01/23/2023 Time ECG obtained: 0937 AM Rate: 56 bpm Rhythm: sinus bradycardia Axis (leads I and aVF): Normal Intervals: PR 152 ms. QRS 94 ms. QTc 397 ms. ST segment and T wave changes: No evidence of acute ST segment elevation or depression.  Comparison: Similar to previous tracing obtained on 03/13/2020    IMAGING / PROCEDURES: VAS US DOPPLER PRE CABG performed on 06/18/2017 There is evidence in the right ICA of a 1-39% stenosis.  There is evidence in the left ICA of a 1-39% stenosis.   TRANSTHORACIC ECHOCARDIOGRAM performed on 11/15/2021  The left ventricle is normal in size with mildly increased wall thickness.  The  left ventricular systolic function is normal, LVEF is visually estimated at > 55%.  There is grade I diastolic dysfunction (impaired relaxation).  Mitral annular calcification is present (mild).  The aortic valve is trileaflet with mildly thickened leaflets with normal excursion.  The left atrium is mildly dilated in size.  The right ventricle is normal in size, with normal systolic function.  The right atrium is mildly dilated  in size.  Dilated pulmonary artery - mild.   LEFT HEART CATHETERIZATION AND CORONARY ANGIOGRAPHY performed on 11/06/2021 Normal left ventricular systolic function Elevated LVEDP at 24 mmHg Mild inferior and lateral hypokinesis Multivessel CAD 60% ISR mid RCA 70% ISR distal RCA 80% proximal LAD 95% D1 Occluded mid LAD Occluded OM1 Bypass grafts LIMA-LAD patent SVG-RCA occluded SVG-OM occluded SVG-D1 occluded Interventions Successful balloon angioplasty of mid RCA instent restenosis  Successful balloon angioplasty of distal RCA instent restenosis  Successful balloon angioplasty of proximal LAD stenosis (stent not used because of proximity to origin of D1)   Impression and Plan:  Curtis Sherman has been referred for pre-anesthesia review and clearance prior to him undergoing the planned anesthetic and procedural courses. Available labs, pertinent testing, and imaging results were personally reviewed by me in preparation for upcoming operative/procedural course. [facility] medical record has been updated following extensive record review and patient interview with PAT staff.   This patient has been appropriately cleared by cardiology with an overall MODERATE risk of experiencing significant perioperative cardiovascular complications. Based on clinical review performed today (01/28/23), barring any significant acute changes in the patient's overall condition, it is anticipated that he will be able to proceed with the planned surgical intervention. Any acute  changes in clinical condition may necessitate his procedure being postponed and/or cancelled. Patient will meet with anesthesia team (MD and/or CRNA) on the day of his procedure for preoperative evaluation/assessment. Questions regarding anesthetic course will be fielded at that time.   Pre-surgical instructions were reviewed with the patient during his PAT appointment, and questions were fielded to satisfaction by PAT clinical staff. He has been instructed on which medications that he will need to hold prior to surgery, as well as the ones that have been deemed safe/appropriate to take on the day of his procedure. As part of the general education provided by PAT, patient made aware both verbally and in writing, that he would need to abstain from the use of any illegal substances during his perioperative course.  He was advised that failure to follow the provided instructions could necessitate case cancellation or result in serious perioperative complications up to and including death. Patient encouraged to contact PAT and/or his surgeon's office to discuss any questions or concerns that may arise prior to surgery; verbalized understanding.   Quentin Mulling, MSN, APRN, FNP-C, CEN [facility]  Peri-operative Services Nurse Practitioner Phone: [phone] Fax: [phone] 01/28/23 11:11 AM  NOTE: This note has been prepared using Dragon dictation software. Despite my best ability to proofread, there is always the potential that unintentional transcriptional errors may still occur from this process.

## 2023-01-29 MED ORDER — SODIUM CHLORIDE 0.9 % IV SOLN: INTRAVENOUS | Status: DC

## 2023-01-29 MED ORDER — CEFAZOLIN SODIUM-DEXTROSE 2-4 GM/100ML-% IV SOLN
2.0000 g | INTRAVENOUS | Status: AC
  Administered 2023-01-30: 2 g via INTRAVENOUS

## 2023-01-29 MED ORDER — CHLORHEXIDINE GLUCONATE CLOTH 2 % EX PADS
6.0000 | MEDICATED_PAD | Freq: Once | CUTANEOUS | Status: AC
  Administered 2023-01-30: 6 via TOPICAL

## 2023-01-29 MED ORDER — ORAL CARE MOUTH RINSE: 15.0000 mL | Freq: Once | OROMUCOSAL | Status: AC

## 2023-01-29 MED ORDER — CHLORHEXIDINE GLUCONATE 0.12 % MT SOLN
15.0000 mL | Freq: Once | OROMUCOSAL | Status: AC
  Administered 2023-01-30: 15 mL via OROMUCOSAL

## 2023-01-30 ENCOUNTER — Ambulatory Visit: Payer: Medicare HMO | Admitting: Urgent Care

## 2023-01-30 ENCOUNTER — Other Ambulatory Visit: Payer: Self-pay

## 2023-01-30 ENCOUNTER — Encounter
Admission: RE | Disposition: A | Discharge status: Home / Self Care | Payer: Self-pay | Source: Home / Self Care | Attending: Surgery

## 2023-01-30 ENCOUNTER — Ambulatory Visit
Admission: RE | Admit: 2023-01-30 | Discharge: 2023-01-30 | Disposition: A | Discharge status: Home / Self Care | Payer: Medicare HMO | Attending: Surgery | Admitting: Surgery

## 2023-01-30 ENCOUNTER — Encounter: Payer: Self-pay | Admitting: Surgery

## 2023-01-30 DIAGNOSIS — Z01812 Encounter for preprocedural laboratory examination: Secondary | ICD-10-CM

## 2023-01-30 DIAGNOSIS — Z87891 Personal history of nicotine dependence: Secondary | ICD-10-CM | POA: Insufficient documentation

## 2023-01-30 DIAGNOSIS — D1722 Benign lipomatous neoplasm of skin and subcutaneous tissue of left arm: Secondary | ICD-10-CM | POA: Diagnosis not present

## 2023-01-30 DIAGNOSIS — D171 Benign lipomatous neoplasm of skin and subcutaneous tissue of trunk: Secondary | ICD-10-CM | POA: Diagnosis not present

## 2023-01-30 HISTORY — DX: Long term (current) use of antithrombotics/antiplatelets: Z79.02

## 2023-01-30 HISTORY — DX: Benign lipomatous neoplasm of skin and subcutaneous tissue of trunk: D17.1

## 2023-01-30 HISTORY — PX: LIPOMA EXCISION: SHX5283

## 2023-01-30 HISTORY — DX: Obstructive sleep apnea (adult) (pediatric): G47.33

## 2023-01-30 HISTORY — PX: MASS EXCISION: SHX2000

## 2023-01-30 HISTORY — DX: Primary osteoarthritis, right ankle and foot: M19.071

## 2023-01-30 HISTORY — DX: Atherosclerosis of aorta: I70.0

## 2023-01-30 LAB — GLUCOSE, CAPILLARY: Glucose-Capillary: 120 mg/dL — ABNORMAL HIGH (ref 70–99)

## 2023-01-30 SURGERY — EXCISION LIPOMA
Anesthesia: General | Site: Arm Upper

## 2023-01-30 MED ORDER — LIDOCAINE HCL (PF) 1 % IJ SOLN
INTRAMUSCULAR | Status: AC
  Filled 2023-01-30: qty 30

## 2023-01-30 MED ORDER — FENTANYL CITRATE (PF) 100 MCG/2ML IJ SOLN
INTRAMUSCULAR | Status: AC
  Filled 2023-01-30: qty 2

## 2023-01-30 MED ORDER — FENTANYL CITRATE (PF) 100 MCG/2ML IJ SOLN: 25.0000 ug | INTRAMUSCULAR | Status: DC | PRN

## 2023-01-30 MED ORDER — PROMETHAZINE HCL 25 MG/ML IJ SOLN: 6.2500 mg | INTRAMUSCULAR | Status: DC | PRN

## 2023-01-30 MED ORDER — BUPIVACAINE-EPINEPHRINE 0.5% -1:200000 IJ SOLN
INTRAMUSCULAR | Status: DC | PRN
  Administered 2023-01-30: 19 mL

## 2023-01-30 MED ORDER — OXYCODONE HCL 5 MG PO TABS: 5.0000 mg | ORAL_TABLET | Freq: Once | ORAL | Status: DC | PRN

## 2023-01-30 MED ORDER — DOCUSATE SODIUM 100 MG PO CAPS: 100.0000 mg | ORAL_CAPSULE | Freq: Two times a day (BID) | ORAL | 0 refills | Status: AC | PRN

## 2023-01-30 MED ORDER — OXYCODONE-ACETAMINOPHEN 5-325 MG PO TABS: 1.0000 | ORAL_TABLET | Freq: Four times a day (QID) | ORAL | 0 refills | Status: AC | PRN

## 2023-01-30 MED ORDER — ACETAMINOPHEN 10 MG/ML IV SOLN: 1000.0000 mg | Freq: Once | INTRAVENOUS | Status: DC | PRN

## 2023-01-30 MED ORDER — FENTANYL CITRATE (PF) 100 MCG/2ML IJ SOLN
INTRAMUSCULAR | Status: DC | PRN
  Administered 2023-01-30 (×2): 50 ug via INTRAVENOUS

## 2023-01-30 MED ORDER — PROPOFOL 10 MG/ML IV BOLUS
INTRAVENOUS | Status: DC | PRN
Start: 2023-01-30 — End: 2023-01-30
  Administered 2023-01-30: 100 mg via INTRAVENOUS

## 2023-01-30 MED ORDER — DROPERIDOL 2.5 MG/ML IJ SOLN
0.6250 mg | Freq: Once | INTRAMUSCULAR | Status: AC | PRN
  Administered 2023-01-30: 0.625 mg via INTRAVENOUS

## 2023-01-30 MED ORDER — DROPERIDOL 2.5 MG/ML IJ SOLN
INTRAMUSCULAR | Status: AC
  Filled 2023-01-30: qty 2

## 2023-01-30 MED ORDER — BUPIVACAINE-EPINEPHRINE (PF) 0.5% -1:200000 IJ SOLN
INTRAMUSCULAR | Status: AC
  Filled 2023-01-30: qty 30

## 2023-01-30 MED ORDER — LIDOCAINE HCL (PF) 1 % IJ SOLN
INTRAMUSCULAR | Status: DC | PRN
  Administered 2023-01-30: 19 mL

## 2023-01-30 MED ORDER — EPHEDRINE SULFATE (PRESSORS) 50 MG/ML IJ SOLN
INTRAMUSCULAR | Status: DC | PRN
  Administered 2023-01-30: 10 mg via INTRAVENOUS
  Administered 2023-01-30: 15 mg via INTRAVENOUS

## 2023-01-30 MED ORDER — ONDANSETRON HCL 4 MG/2ML IJ SOLN
INTRAMUSCULAR | Status: DC | PRN
  Administered 2023-01-30: 4 mg via INTRAVENOUS

## 2023-01-30 MED ORDER — CEFAZOLIN SODIUM-DEXTROSE 2-4 GM/100ML-% IV SOLN
INTRAVENOUS | Status: AC
  Filled 2023-01-30: qty 100

## 2023-01-30 MED ORDER — LIDOCAINE HCL (CARDIAC) PF 100 MG/5ML IV SOSY
PREFILLED_SYRINGE | INTRAVENOUS | Status: DC | PRN
  Administered 2023-01-30: 100 mg via INTRAVENOUS

## 2023-01-30 MED ORDER — 0.9 % SODIUM CHLORIDE (POUR BTL) OPTIME
TOPICAL | Status: DC | PRN
  Administered 2023-01-30: 100 mL

## 2023-01-30 MED ORDER — CHLORHEXIDINE GLUCONATE 0.12 % MT SOLN
OROMUCOSAL | Status: AC
  Filled 2023-01-30: qty 15

## 2023-01-30 MED ORDER — OXYCODONE HCL 5 MG/5ML PO SOLN: 5.0000 mg | Freq: Once | ORAL | Status: DC | PRN

## 2023-01-30 SURGICAL SUPPLY — 35 items
ADH SKN CLS APL DERMABOND .7 (GAUZE/BANDAGES/DRESSINGS) ×2
APL PRP STRL LF DISP 70% ISPRP (MISCELLANEOUS) ×2
BLADE SURG 15 STRL LF DISP TIS (BLADE) ×2 IMPLANT
BLADE SURG 15 STRL SS (BLADE) ×2
CHLORAPREP W/TINT 26 (MISCELLANEOUS) ×2 IMPLANT
DERMABOND ADVANCED .7 DNX12 (GAUZE/BANDAGES/DRESSINGS) ×2 IMPLANT
DRAPE 3/4 80X56 (DRAPES) ×2 IMPLANT
DRAPE LAPAROTOMY 100X77 ABD (DRAPES) ×2 IMPLANT
ELECT CAUTERY BLADE 6.4 (BLADE) ×2 IMPLANT
ELECT REM PT RETURN 9FT ADLT (ELECTROSURGICAL) ×2
ELECTRODE REM PT RTRN 9FT ADLT (ELECTROSURGICAL) ×2 IMPLANT
GAUZE 4X4 16PLY ~~LOC~~+RFID DBL (SPONGE) ×2 IMPLANT
GLOVE BIOGEL PI IND STRL 7.0 (GLOVE) ×2 IMPLANT
GLOVE SURG SYN 6.5 ES PF (GLOVE) ×8 IMPLANT
GLOVE SURG SYN 6.5 PF PI (GLOVE) ×2 IMPLANT
GOWN STRL REUS W/ TWL LRG LVL3 (GOWN DISPOSABLE) ×4 IMPLANT
GOWN STRL REUS W/TWL LRG LVL3 (GOWN DISPOSABLE) ×8
KIT TURNOVER KIT A (KITS) ×2 IMPLANT
LABEL OR SOLS (LABEL) ×2 IMPLANT
MANIFOLD NEPTUNE II (INSTRUMENTS) ×2 IMPLANT
NDL HYPO 22X1.5 SAFETY MO (MISCELLANEOUS) ×2 IMPLANT
NEEDLE HYPO 22X1.5 SAFETY MO (MISCELLANEOUS) ×2 IMPLANT
NS IRRIG 1000ML POUR BTL (IV SOLUTION) ×2 IMPLANT
PACK BASIN MINOR ARMC (MISCELLANEOUS) ×2 IMPLANT
SUT ETHILON 3-0 FS-10 30 BLK (SUTURE)
SUT MNCRL 4-0 (SUTURE) ×2
SUT MNCRL 4-0 27XMFL (SUTURE) ×2
SUT VIC AB 3-0 SH 27 (SUTURE) ×2
SUT VIC AB 3-0 SH 27X BRD (SUTURE) ×2 IMPLANT
SUTURE EHLN 3-0 FS-10 30 BLK (SUTURE) IMPLANT
SUTURE MNCRL 4-0 27XMF (SUTURE) ×2 IMPLANT
SYR 30ML LL (SYRINGE) ×2 IMPLANT
TOWEL OR 17X26 4PK STRL BLUE (TOWEL DISPOSABLE) ×2 IMPLANT
TRAP FLUID SMOKE EVACUATOR (MISCELLANEOUS) ×2 IMPLANT
WATER STERILE IRR 500ML POUR (IV SOLUTION) ×2 IMPLANT

## 2023-01-30 NOTE — Op Note (Signed)
Pre-Op Dx: lipoma flank x2, left arm x1 Post-Op Dx: same Anesthesia: LMA EBL: Complications:  none apparent Specimen: lipomas torso, left arm lipoma Procedure: excisional biopsy of lipoma flank x2, left arm x1 Surgeon: Tonna Boehringer  Indications for procedure: See H&P  Description of Procedure:  Consent obtained, time out performed.  Patient placed in supine position, left arm positioned to be able to access left upper arm lipoma. Areas sterilized and draped in usual position.  Local infused to area previously marked.  4 and 5 cm incision made through dermis with 15blade over torso lipomas, 4cm incision made over left arm lipoma.  Lipomas noted in subcutaneous layer.  Caudad torso lipoma measured 1.5cm x 1.5cm x 0.5cm, cephalad torso lipoma measured 2cm x 1.5cm x 0.5cm, left arm lipoma measured 1cm x 1cm x 0.5cm.  All lipomas then removed from surrounding tissue completely using electrocautery, passed off field pending pathology.  Wound hemostasis noted, then closed with running 4-0 monocryl in subcuticular fashion for epidermal layer.  Wounds then dressed with dermabond.  Pt tolerated procedure well, and transferred to PACU in stable condition. Sponge and instrument count correct at end of procedure.

## 2023-01-30 NOTE — Interval H&P Note (Signed)
History and Physical Interval Note:  01/30/2023 8:17 AM  Curtis Sherman  has presented today for surgery, with the diagnosis of lipoma of abdominal wall D17.1 lipoma of left upper extremity D17.22.  The various methods of treatment have been discussed with the patient and family. After consideration of risks, benefits and other options for treatment, the patient has consented to  Procedure(s): EXCISION LIPOMA (N/A) EXCISION MASS (Left) as a surgical intervention.  The patient's history has been reviewed, patient examined, no change in status, stable for surgery.  I have reviewed the patient's chart and labs.  Questions were answered to the patient's satisfaction.      Tonna Boehringer

## 2023-01-30 NOTE — Anesthesia Procedure Notes (Signed)
Procedure Name: LMA Insertion Date/Time: 01/30/2023 8:47 AM  Performed by: Maryla Morrow., CRNAPre-anesthesia Checklist: Patient identified, Patient being monitored, Timeout performed, Emergency Drugs available and Suction available Patient Re-evaluated:Patient Re-evaluated prior to induction Oxygen Delivery Method: Circle system utilized Preoxygenation: Pre-oxygenation with 100% oxygen Induction Type: IV induction Ventilation: Mask ventilation without difficulty LMA: LMA inserted LMA Size: 4.0 Tube type: Oral Number of attempts: 1 Placement Confirmation: positive ETCO2 and breath sounds checked- equal and bilateral Tube secured with: Tape Dental Injury: Teeth and Oropharynx as per pre-operative assessment

## 2023-01-30 NOTE — Anesthesia Preprocedure Evaluation (Signed)
Anesthesia Evaluation  Patient identified by MRN, date of birth, ID band Patient awake    Reviewed: Allergy & Precautions, H&P , NPO status , Patient's Chart, lab work & pertinent test results, reviewed documented beta blocker date and time   Airway Mallampati: II  TM Distance: >3 FB Neck ROM: full    Dental  (+) Teeth Intact   Pulmonary sleep apnea , pneumonia, resolved, former smoker   Pulmonary exam normal        Cardiovascular Exercise Tolerance: Poor hypertension, On Medications + angina with exertion + CAD, + Past MI and + Peripheral Vascular Disease  Normal cardiovascular exam Rate:Normal     Neuro/Psych negative neurological ROS  negative psych ROS   GI/Hepatic Neg liver ROS,GERD  ,,  Endo/Other  negative endocrine ROSdiabetes    Renal/GU Renal disease  negative genitourinary   Musculoskeletal   Abdominal   Peds  Hematology negative hematology ROS (+)   Anesthesia Other Findings   Reproductive/Obstetrics negative OB ROS                             Anesthesia Physical Anesthesia Plan  ASA: 3  Anesthesia Plan: General LMA   Post-op Pain Management:    Induction:   PONV Risk Score and Plan: 3  Airway Management Planned:   Additional Equipment:   Intra-op Plan:   Post-operative Plan:   Informed Consent: I have reviewed the patients History and Physical, chart, labs and discussed the procedure including the risks, benefits and alternatives for the proposed anesthesia with the patient or authorized representative who has indicated his/her understanding and acceptance.       Plan Discussed with: CRNA  Anesthesia Plan Comments:        Anesthesia Quick Evaluation

## 2023-01-30 NOTE — Discharge Instructions (Addendum)
Removal, Care After This sheet gives you information about how to care for yourself after your procedure. Your health care provider may also give you more specific instructions. If you have problems or questions, contact your health care provider. What can I expect after the procedure? After the procedure, it is common to have: Soreness. Bruising. Itching. Follow these instructions at home: site care Follow instructions from your health care provider about how to take care of your site. Make sure you: Wash your hands with soap and water before and after you change your bandage (dressing). If soap and water are not available, use hand sanitizer. Leave stitches (sutures), skin glue, or adhesive strips in place. These skin closures may need to stay in place for 2 weeks or longer. If adhesive strip edges start to loosen and curl up, you may trim the loose edges. Do not remove adhesive strips completely unless your health care provider tells you to do that. If the area bleeds or bruises, apply gentle pressure for 10 minutes. OK TO SHOWER IN 24HRS RESUME PLAVIX IN 48HRS  Check your site every day for signs of infection. Check for: Redness, swelling, or pain. Fluid or blood. Warmth. Pus or a bad smell.  General instructions Rest and then return to your normal activities as told by your health care provider.  tylenol and advil as needed for discomfort.  Please alternate between the two every four hours as needed for pain.    Use narcotics, if prescribed, only when tylenol and motrin is not enough to control pain.  325-650mg  every 8hrs to max of 3000mg /24hrs (including the 325mg  in every norco dose) for the tylenol.    Advil up to 800mg  per dose every 8hrs as needed for pain.   Keep all follow-up visits as told by your health care provider. This is important. Contact a health care provider if: You have redness, swelling, or pain around your site. You have fluid or blood coming from your  site. Your site feels warm to the touch. You have pus or a bad smell coming from your site. You have a fever. Your sutures, skin glue, or adhesive strips loosen or come off sooner than expected. Get help right away if: You have bleeding that does not stop with pressure or a dressing. Summary After the procedure, it is common to have some soreness, bruising, and itching at the site. Follow instructions from your health care provider about how to take care of your site. Check your site every day for signs of infection. Contact a health care provider if you have redness, swelling, or pain around your site, or your site feels warm to the touch. Keep all follow-up visits as told by your health care provider. This is important. This information is not intended to replace advice given to you by your health care provider. Make sure you discuss any questions you have with your health care provider. Document Released: 07/13/2015 Document Revised: 12/14/2017 Document Reviewed: 12/14/2017 Elsevier Interactive Patient Education  2019 [employer]  AMBULATORY SURGERY  DISCHARGE INSTRUCTIONS   The drugs that you were given will stay in your system until tomorrow so for the next 24 hours you should not:  Drive an automobile Make any legal decisions Drink any alcoholic beverage   You may resume regular meals tomorrow.  Today it is better to start with liquids and gradually work up to solid foods.  You may eat anything you prefer, but it is better to start with liquids, then soup  and crackers, and gradually work up to solid foods.   Please notify your doctor immediately if you have any unusual bleeding, trouble breathing, redness and pain at the surgery site, drainage, fever, or pain not relieved by medication.  Your post-operative visit with Dr.                                     is: Date:                        Time:    Please call to schedule your post-operative visit.  Additional  Instructions:

## 2023-01-30 NOTE — Transfer of Care (Signed)
Immediate Anesthesia Transfer of Care Note  Patient: Curtis Sherman  Procedure(s) Performed: EXCISION LIPOMA (Abdomen) EXCISION MASS (Left: Arm Upper)  Patient Location: PACU  Anesthesia Type:General  Level of Consciousness: awake and alert   Airway & Oxygen Therapy: Patient Spontanous Breathing  Post-op Assessment: Report given to RN and Post -op Vital signs reviewed and stable  Post vital signs: stable  Last Vitals:  Vitals Value Taken Time  BP 103/63 01/30/23 0909  Temp    Pulse 80 01/30/23 0910  Resp 13 01/30/23 0910  SpO2 95 % 01/30/23 0910  Vitals shown include unfiled device data.  Last Pain:  Vitals:   01/30/23 0737  TempSrc: Temporal      Patients Stated Pain Goal: 0 (01/30/23 0737)  Complications: No notable events documented.

## 2023-02-02 NOTE — Anesthesia Postprocedure Evaluation (Signed)
Anesthesia Post Note  Patient: Curtis Sherman  Procedure(s) Performed: EXCISION LIPOMA (Abdomen) EXCISION MASS (Left: Arm Upper)  Patient location during evaluation: PACU Anesthesia Type: General Level of consciousness: awake and alert Pain management: pain level controlled Vital Signs Assessment: post-procedure vital signs reviewed and stable Respiratory status: spontaneous breathing, nonlabored ventilation, respiratory function stable and patient connected to nasal cannula oxygen Cardiovascular status: blood pressure returned to baseline and stable Postop Assessment: no apparent nausea or vomiting Anesthetic complications: no   There were no known notable events for this encounter.   Last Vitals:  Vitals:   01/30/23 0945 01/30/23 1013  BP: 128/68 136/78  Pulse: 66 60  Resp: 13 16  Temp:  (!) 36.4 C  SpO2: 91% 95%    Last Pain:  Vitals:   01/30/23 1013  TempSrc: Temporal  PainSc: 0-No pain                 Yevette Edwards

## 2023-02-13 ENCOUNTER — Encounter: Payer: Self-pay | Admitting: Surgery

## 2023-03-05 NOTE — Unmapped (Signed)
[facility] Refill Coordination Note    Dale Webb, DOB: [birth year]  Phone: [phone] (home)       All above HIPAA information was verified with patient.         03/04/2023     6:38 PM   Specialty Rx Medication Refill Questionnaire   Which Medications would you like refilled and shipped? Repatha 140mg    Please list all current allergies: n/a   Have you missed any doses in the last 30 days? No   Have you had any changes to your medication(s) since your last refill? No   How many days remaining of each medication do you have at home? 30   If receiving an injectable medication, next injection date is 03/12/2023   Have you experienced any side effects in the last 30 days? No   Please enter the full address (street address, city, state, zip code) where you would like your medication(s) to be delivered to. [address] [place], [place]. [address]   Please specify on which day you would like your medication(s) to arrive. Note: if you need your medication(s) within 3 days, please call the pharmacy to schedule your order at [phone]  03/19/2023   Has your insurance changed since your last refill? No   Would you like a pharmacist to call you to discuss your medication(s)? No   Do you require a signature for your package? (Note: if we are billing Medicare Part B or your order contains a controlled substance, we will require a signature) No         Completed refill call assessment today to schedule patient's medication shipment from the [facility] [phone]).  All relevant notes have been reviewed.       Confirmed patient received a Conservation officer, historic buildings and a Surveyor, mining with first shipment. The patient will receive a drug information handout for each medication shipped and additional FDA Medication Guides as required.         REFERRAL TO PHARMACIST     Referral to the pharmacist: Not needed      [facility]     Shipping address confirmed in Epic.     Delivery Scheduled: Yes, Expected medication delivery date: 03/19/23.     Medication will be delivered via UPS to the prescription address in Epic WAM.    Moshe Salisbury   [facility] Specialty Technician

## 2023-03-18 MED FILL — REPATHA SURECLICK 140 MG/ML SUBCUTANEOUS PEN INJECTOR: SUBCUTANEOUS | 84 days supply | Qty: 6 | Fill #1

## 2023-03-18 MED FILL — EMPTY CONTAINER: 120 days supply | Qty: 1 | Fill #2

## 2023-04-23 ENCOUNTER — Ambulatory Visit
Admit: 2023-04-23 | Discharge: 2023-04-24 | Attending: Student in an Organized Health Care Education/Training Program | Primary: Student in an Organized Health Care Education/Training Program

## 2023-04-23 DIAGNOSIS — I739 Peripheral vascular disease, unspecified: Principal | ICD-10-CM

## 2023-04-23 DIAGNOSIS — Z9861 Coronary angioplasty status: Principal | ICD-10-CM

## 2023-04-23 DIAGNOSIS — I2510 Atherosclerotic heart disease of native coronary artery without angina pectoris: Principal | ICD-10-CM

## 2023-04-23 DIAGNOSIS — I10 Essential (primary) hypertension: Principal | ICD-10-CM

## 2023-04-23 DIAGNOSIS — E782 Mixed hyperlipidemia: Principal | ICD-10-CM

## 2023-04-23 DIAGNOSIS — Z789 Other specified health status: Principal | ICD-10-CM

## 2023-04-23 DIAGNOSIS — I771 Stricture of artery: Principal | ICD-10-CM

## 2023-04-23 NOTE — Unmapped (Signed)
DIVISION OF CARDIOLOGY  [employer] [place]        Date of Service: 04/23/2023    Return Patient Clinic Note    PCP: Referring Provider:   [name]  [address] [place] [address]  Phone: [phone]  Fax: [facility], Gavin Potters  [address]  [place],  [place] [address]  Phone: [phone]  Fax:      Assessment and Plan:      Dale Webb is a 67 y.o. male w/PMHx of coronary artery disease and a history of inferior STEMI in 2018, s/p four-vessel CABG LIMA-LAD, SVG-diagonal, SVG-OM1, SVG-RCA on 06/19/2017 (all 3 SVGs known to be occluded), essential hypertension, hyperlipidemia and OSA, underwent left heart catheterization on April 19, 2018, received 2X DES to RCA, b/l iliac stenting on 08/02/2018.  PCI to LAD and RCA in 10/2021 with improvement of symptoms, presents today to follow up.    Coronary artery disease  Patient initially presented to [facility] on December 2018 for chest pain, was found to have inferior STEMI and subsequently underwent CABG x 4 (LIMA-LAD, SVG-DIAG, SVG-OM, SVG-DIST RCA). He received successful PCI of mid and distal RCA with placement of 2 DES in 2019. Most recently, underwent PCI 10/2021 due to unstable angina (balloon angioplasty to RCA and LAD for in-stent restenosis). TTE 10/2021 with mild abnormalities but EF normal. Today, denies any anginal symptoms.  --Continue aspirin 81 mg daily  --continue clopidogrel (CYP2C19 *1/*17).    Peripheral vascular disease s/p iliac stents on Feb 2020    Angiography on 04/19/18 showed moderate right common iliac lesion (50%) with approximately a gradient and severe left common iliac disease with an 80% ostial stenosis. Underwent b/l stenting to iliac arteries on 08/02/2018. Symptoms improved after procedure, and continues to remain asymptomatic.   - Encouraged pt to continue exercise. Advised on 150 min/week or 30 min 5 days a week with set pace/exertional level.      Essential hypertension: BP today 138/74. States at home more consistently 120s systolic.    - continue home with lorsartan 100 mg daily, amlodipine 10 mg daily    Hyperlipidemia: LDL 186 10/2021, repeat 11/2021 LDL 81. Has been off statins due to prior issues with associated hyperglycemia and hesitant to restart. Repeat labs showing LDL 100, HgA1c improved off statin to 6.6. Evolocumab started and tolerating well.   - Continue ezetimibe 10 mg, evolocumab  - Repeat lipid panel next visit    DM- Most recent A1c 6.6 12/2022. Managed with pioglitazone, improved off statin. Though does not have history of HF, would likely benefit from alternative drug therapy for diabetes.     Former tobacco use - quit 07/30/13      Return in about 6 months (around 10/22/2023).      Subjective:        Reason for Visit: Follow-up    History of Present Illness: Dale Webb is a 67 y.o. with a past medical history of  inferior STEMI in 2018, s/p four-vessel CABG LIMA-LAD, SVG-diagonal, SVG-OM1, SVG-RCA on 06/19/2017, essential hypertension, hyperlipidemia and OSA, with multiple PCIs since that presents for follow-up.    Since last visit patient is doing well.   Patient s/p foot surgery 07/2022, and has recovered well, out of cast and crutches. Has not increased his exercise regiment since last visit. He denies LE edema, PND, orthopnea, CP, SOB. He continues to tolerate repatha.     Cardiovascular History:  CAD  CABG  PCI  HTN  HLD  OSA  Prior tobacco use     Cardiovascular Studies Date Comments     ECG     Echo 06/17/17 - Left ventricle: Poor image quality possibl inferolateral  hypokinesis on apical views overall EF normal range. The cavity  size was normal. Wall thickness was increased in a pattern of  mild LVH. Systolic function was normal. The estimated ejection  fraction was in the range of 55% to 60%. Wall motion was normal;  there were no regional wall motion abnormalities.  - Mitral valve: Calcified annulus.  - Atrial septum: No defect or patent foramen ovale was identified.     Stress test     Cardiac catheterization   11/06/2021                                                                04/19/18                                     Findings:  Mild inferior and lateral hypokinesis with preserved LV systolic function  Elevated LVEDP at 24 mm Hg  Coronary artery disease including 60% mid-RCA instent restenosis, 70% distal RCA instent restenosis (FFR = 0.82), 80% proximal LAD stenosis (FFR = 0.82), 95% D1 stenosis, occluded mid-LAD (fills via LIMA graft) and occluded OM1 (fills via collaterals from the LIMA to LAD graft).    Successful balloon angioplasty of mid RCA instent restenosis   Successful balloon angioplasty of distal RCA instent restenosis  Successful balloon angioplasty of proximal LAD stenosis (stent not used because of proximity to origin of D1)  Prior coronary bypass surgery with occluded SVG to RCA, occluded SVG to OM, occluded SVG to first diagonal and patent LIMA to LAD  Patent stents in right and left common iliac arteries         Coronary artery disease including subtotally occluded right coronary artery   Successful PCI of mid and distal RCA with placement of two Xience DES with excellent angiographic result and TIMI III flow.   Prior coronary bypass surgery with occluded SVG to RCA, occluded SVG to OM, occluded SVG to first diagonal and patent LIMA to LAD  Moderate right common iliac lesion (50%) with approximately a gradient.    Severe left common iliac disease with an 80% ostial stenosis  Low normal left ventricular filling pressures (LVEDP = 10 mm Hg).  Septal hypokinesis with preserved LV systolic function (estimated LVEF = 50%).          CYP2C19 Genotype 04/19/2018 *1/*19   Electrophysiology      Cardiovascular Surgery 06/19/2017 CABG x 4 (LIMA-LAD, SVG-DIAG, SVG-OM, SVG-DIST RCA) with EVH from RIGHT GREATER SAPHENOUS VEIN and LEFT INTERNAL MAMMARY ARTERY HARVEST by Dr. Tyrone Sage     Peripheral Vascular Studies     Anginal Equivalent       Relevant Past Medical History:    Patient's Medications   New Prescriptions    No medications on file   Previous Medications    ACETAMINOPHEN (TYLENOL) 325 MG TABLET    Take by mouth every four (4) hours as needed.    AMLODIPINE (NORVASC) 10 MG TABLET  Take 1 tablet by mouth once daily    B COMPLEX C 11/CALCIUM/DHA/Q10 (BRAIN MIGHT-DHA-CO Q10 ORAL)    Take 1 tablet by mouth in the morning.    CLOPIDOGREL (PLAVIX) 75 MG TABLET    Take 1 tablet (75 mg total) by mouth daily.    COFFEE XT-PHOSPHATIDYL SERINE (NEURIVA ORIGINAL) 100-100 MG CAP    Take 1 capsule by mouth in the morning.    CYCLOBENZAPRINE (FLEXERIL) 5 MG TABLET    Take 1 tablet (5 mg total) by mouth Three (3) times a day as needed for muscle spasms.    EMPTY CONTAINER (SHARPS-A-GATOR DISPOSAL SYSTEM) MISC    Use as directed for sharps disposal    EVOLOCUMAB (REPATHA SURECLICK) 140 MG/ML PNIJ    Inject the contents of one pen (140 mg) under the skin every fourteen (14) days.    EZETIMIBE (ZETIA) 10 MG TABLET    Take 1 tablet by mouth once daily    LACTOBACILLUS RHAMNOSUS GG (CULTURELLE) 10 BILLION CELL CAPSULE    Take 1 capsule by mouth daily.    LOSARTAN (COZAAR) 50 MG TABLET    Take 2 tablets (100 mg total) by mouth daily.    OXYCODONE (ROXICODONE) 5 MG IMMEDIATE RELEASE TABLET    Take 1 tablet (5 mg total) by mouth every four (4) hours as needed for pain for up to 30 doses.    OXYCODONE-ACETAMINOPHEN (PERCOCET) 5-325 MG PER TABLET    Take 1 tablet by mouth every six (6) hours as needed.    PANTOPRAZOLE (PROTONIX) 40 MG TABLET    Take 1 tablet (40 mg total) by mouth two (2) times a day.    PIOGLITAZONE (ACTOS) 30 MG TABLET    Take 1 tablet (30 mg total) by mouth daily.   Modified Medications    No medications on file   Discontinued Medications    No medications on file       Allergies:  Allergies   Allergen Reactions    Lisinopril Cough    Codeine Nausea Only       Social History:  He  reports that he has quit smoking. His smoking use included cigars and cigarettes. He has never used smokeless tobacco. He reports that he does not drink alcohol and does not use drugs.    Family History:  His family history is not on file.    Review of Systems  10 systems were reviewed and negative except as noted in HPI.      Objective:       Physical Exam  BP 138/71 (BP Site: R Arm, BP Position: Sitting, BP Cuff Size: X-Large)  - Pulse 50  - Wt 92.1 kg (203 lb)  - SpO2 95%  - BMI 27.53 kg/m??    Wt Readings from Last 3 Encounters:   04/23/23 92.1 kg (203 lb)   07/23/22 89.7 kg (197 lb 12 oz)   06/18/22 88.5 kg (195 lb 3.2 oz)       General:  Alert, no distress.   HEENT:  EOMI, sclerae anicteric, MMM.   Neck: Supple, no carotid bruit. JVP   Lungs:   CTAB bilaterally with normal WOB.   Heart:   Regular rate and rhythm, no murmurs, S1/S2 heard   Abdomen:   Soft, non-tender, non-distended.   Extremities: RLE in foot cast. No edema bilaterally.   Skin: No lesions/rashes.   Neurologic: No focal deficits.     Most recent labs     Lab Results  Component Value Date    WBC 4.6 11/06/2021    RBC 4.78 11/06/2021    HGB 14.9 11/06/2021    HCT 43.8 11/06/2021    MCV 91.7 11/06/2021    MCH 31.1 11/06/2021    MCHC 33.9 11/06/2021    RDW 13.5 11/06/2021    PLT 142 (L) 11/06/2021    MPV 10.3 11/06/2021       Lab Results   Component Value Date    NA 142 11/06/2021    K 4.5 11/06/2021    CL 110 (H) 11/06/2021    CO2 24.0 11/06/2021    BUN 8 (L) 11/06/2021    CREATININE 0.71 11/06/2021    GLU 117 (H) 11/06/2021    CALCIUM 9.9 11/06/2021       Lab Results   Component Value Date    CHOL 261 (H) 11/06/2021     Lab Results   Component Value Date    HDL 61 (H) 11/06/2021     Lab Results   Component Value Date    LDL 186 (H) 11/06/2021     Lab Results   Component Value Date    TRIG 71 11/06/2021     No components found for: [facility]    Lab Results   Component Value Date    PT 11.1 04/13/2018    INR 0.97 04/13/2018       Lavonna Monarch, MD  Assistant Professor of Medicine  Division of Cardiology

## 2023-05-02 MED ORDER — LOSARTAN 50 MG TABLET
ORAL_TABLET | Freq: Every day | ORAL | 0 refills | 0 days
Start: 2023-05-02 — End: ?

## 2023-05-04 MED ORDER — LOSARTAN 50 MG TABLET
ORAL_TABLET | Freq: Every day | ORAL | 0 refills | 90 days | Status: CP
Start: 2023-05-04 — End: ?

## 2023-05-04 NOTE — Unmapped (Signed)
Refill request received for patient.      Medication Requested: losartan  Last Office Visit: 04/23/23-skaf  Next Office Visit: Visit date not found  Last Prescriber: stouffer    Nurse refill requirements met? No  If not met, why: provider change    Sent to: Provider for signing  If sent to provider, which provider?: skaf

## 2023-06-05 DIAGNOSIS — I2510 Atherosclerotic heart disease of native coronary artery without angina pectoris: Principal | ICD-10-CM

## 2023-06-05 DIAGNOSIS — Z789 Other specified health status: Principal | ICD-10-CM

## 2023-06-05 DIAGNOSIS — E785 Hyperlipidemia, unspecified: Principal | ICD-10-CM

## 2023-06-05 MED ORDER — REPATHA SURECLICK 140 MG/ML SUBCUTANEOUS PEN INJECTOR
SUBCUTANEOUS | 1 refills | 84 days
Start: 2023-06-05 — End: ?

## 2023-06-08 MED ORDER — REPATHA SURECLICK 140 MG/ML SUBCUTANEOUS PEN INJECTOR
SUBCUTANEOUS | 1 refills | 84.00 days | Status: CP
Start: 2023-06-08 — End: ?
  Filled 2023-06-10: qty 6, 84d supply, fill #0

## 2023-06-08 NOTE — Unmapped (Signed)
Refill request received for patient.      Medication Requested: repatha  Last Office Visit: 04/23/23-skaf 09/04/22-stouffer  Next Office Visit: Visit date not found  Last Prescriber: stouffer    Nurse refill requirements met? No  If not met, why: provider change    Sent to: Provider for signing  If sent to provider, which provider?: skaf

## 2023-06-10 NOTE — Unmapped (Signed)
Dale Webb requested a refill of their Repatha via IVR/Web. The [facility] has scheduled delivery per the patients request via UPS to be delivered to their prescription address on 06/11/23.

## 2023-07-03 MED ORDER — CLOPIDOGREL 75 MG TABLET
ORAL_TABLET | Freq: Every day | ORAL | 0 refills | 90.00 days | Status: CP
Start: 2023-07-03 — End: ?

## 2023-07-03 NOTE — Unmapped (Signed)
Refill request received for patient.      Medication Requested: clopidogrel  Last Office Visit: Visit date not found   Next Office Visit: Visit date not found  Last Prescriber: Willene Hatchet    Nurse refill requirements met? No  If not met, why: needs office visit    Sent to: Provider for signing  If sent to provider, which provider?: Willene Hatchet

## 2023-08-02 MED ORDER — LOSARTAN 50 MG TABLET
ORAL_TABLET | Freq: Every day | ORAL | 0 refills | 0.00 days
Start: 2023-08-02 — End: ?

## 2023-08-03 MED ORDER — LOSARTAN 50 MG TABLET
ORAL_TABLET | Freq: Every day | ORAL | 0 refills | 90 days | Status: CP
Start: 2023-08-03 — End: ?

## 2023-08-03 NOTE — Unmapped (Signed)
Refill request received for patient.      Medication Requested: Losartan  Last Office Visit: Visit date not found   Next Office Visit: Visit date not found  Last Prescriber: [email or web address]    Nurse refill requirements met? No  If not met, why: Prescribing physician is not in this clinic    Sent to: Provider for signing  If sent to provider, which provider?: [facility]

## 2023-09-02 NOTE — Unmapped (Signed)
 Dale Webb requested a refill of their Repatha via IVR/Web. The [facility] has scheduled delivery per the patients request via UPS to be delivered to their prescription address on 09/04/23.

## 2023-09-03 MED FILL — REPATHA SURECLICK 140 MG/ML SUBCUTANEOUS PEN INJECTOR: SUBCUTANEOUS | 84 days supply | Qty: 6 | Fill #1

## 2023-09-29 MED ORDER — CLOPIDOGREL 75 MG TABLET
ORAL_TABLET | Freq: Every day | ORAL | 0 refills | 0 days
Start: 2023-09-29 — End: ?

## 2023-09-29 NOTE — Unmapped (Signed)
 Refill request received for patient.      Medication Requested: Plavix  Last Office Visit: Visit date not found   Next Office Visit: Visit date not found  Last Prescriber: Jaymes Graff    Nurse refill requirements met? No  If not met, why: Not patient in this clinic    Sent to: Provider for signing  If sent to provider, which provider?: [facility]

## 2023-09-30 MED ORDER — CLOPIDOGREL 75 MG TABLET
ORAL_TABLET | Freq: Every day | ORAL | 0 refills | 90 days | Status: CP
Start: 2023-09-30 — End: ?

## 2023-10-08 ENCOUNTER — Ambulatory Visit
Admit: 2023-10-08 | Discharge: 2023-10-09 | Attending: Student in an Organized Health Care Education/Training Program | Primary: Student in an Organized Health Care Education/Training Program

## 2023-10-08 DIAGNOSIS — I10 Essential (primary) hypertension: Principal | ICD-10-CM

## 2023-10-08 DIAGNOSIS — E782 Mixed hyperlipidemia: Principal | ICD-10-CM

## 2023-10-08 DIAGNOSIS — Z9861 Coronary angioplasty status: Principal | ICD-10-CM

## 2023-10-08 DIAGNOSIS — Z789 Other specified health status: Principal | ICD-10-CM

## 2023-10-08 DIAGNOSIS — I2510 Atherosclerotic heart disease of native coronary artery without angina pectoris: Principal | ICD-10-CM

## 2023-10-08 DIAGNOSIS — I739 Peripheral vascular disease, unspecified: Principal | ICD-10-CM

## 2023-10-08 MED ORDER — NITROGLYCERIN 0.4 MG SUBLINGUAL TABLET
ORAL_TABLET | SUBLINGUAL | 0 refills | 1.00 days | Status: CP | PRN
Start: 2023-10-08 — End: 2024-10-07

## 2023-10-08 NOTE — Unmapped (Signed)
 VISIT SUMMARY:    During your follow-up visit, we discussed your recent health concerns, including chest pain, headaches, and your ongoing management of hyperlipidemia, hypertension, and diabetes. We reviewed your current medications and made some adjustments to better manage your symptoms and overall health.    YOUR PLAN:    -CHEST PAIN: You experienced chest pain similar to a previous heart attack, which could be angina. Angina is chest pain caused by reduced blood flow to the heart. We prescribed sublingual nitroglycerin to use if the chest pain lasts more than 15 seconds and discussed its potential side effects, including headaches. Please monitor for any recurrence or increase in frequency of the chest pain.    -CORONARY ARTERY DISEASE: You have coronary artery disease and have had bypass grafts. This condition involves the narrowing or blockage of the coronary arteries. You should continue taking clopidogrel as a lifelong therapy to prevent blood clots. We discussed the possibility of future catheterization if your symptoms worsen.    -HYPERLIPIDEMIA: Hyperlipidemia means you have high levels of lipids (fats) in your blood. We need to check your current cholesterol levels, so we have ordered a cholesterol panel to assess this.    -HYPERTENSION: Your blood pressure is well-controlled with your current medication regimen. Hypertension is high blood pressure, which can lead to serious health issues if not managed properly.    -DIABETES MELLITUS: Your diabetes has improved after stopping statin therapy and starting Repatha. Diabetes is a condition where your blood sugar levels are too high. No changes in your diabetes management were discussed today.    -TENSION HEADACHE: You have been experiencing tension headaches, likely due to stress, fatigue, or vision strain. Tension headaches are usually mild to moderate pain that feels like a tight band around your head. We recommend continuing to use Tylenol for relief and avoiding ibuprofen due to potential interactions. If the headaches persist, please follow up with your primary care provider. We also discussed lifestyle changes that may help reduce the frequency of your headaches.    INSTRUCTIONS:    Please use the prescribed sublingual nitroglycerin if you experience chest pain lasting more than 15 seconds. Monitor your symptoms and report any recurrence or increase in frequency. Continue taking clopidogrel as directed. We have ordered a cholesterol panel to check your current lipid levels. If your tension headaches persist, follow up with your primary care provider. Make lifestyle changes to help reduce headache frequency, such as managing stress and ensuring adequate rest.

## 2023-10-08 NOTE — Unmapped (Signed)
 DIVISION OF CARDIOLOGY  [facility] [employer] , [facility]        Date of Service: 10/08/2023    Return Patient Clinic Note    PCP: Referring Provider:   Jeana Michaels  [address] [place] [address]  Phone: [phone]  Fax: None Percival Brace, MD  [address]  [facility]  [place],  [place] [address]  Phone: [phone]  Fax:      Assessment and Plan:      Dale Webb is a 68 y.o. male w/PMHx of coronary artery disease and a history of inferior STEMI in 2018, s/p four-vessel CABG LIMA-LAD, SVG-diagonal, SVG-OM1, SVG-RCA on 06/19/2017 (all 3 SVGs known to be occluded), essential hypertension, hyperlipidemia and OSA, underwent left heart catheterization on April 19, 2018, received 2X DES to RCA, b/l iliac stenting on 08/02/2018.  PCI to LAD and RCA in 10/2021 with improvement of symptoms, presents today to follow up.    Coronary artery disease  Patient initially presented to [facility] on December 2018 for chest pain, was found to have inferior STEMI and subsequently underwent CABG x 4 (LIMA-LAD, SVG-DIAG, SVG-OM, SVG-DIST RCA). He received successful PCI of mid and distal RCA with placement of 2 DES in 2019. Most recently, underwent PCI 10/2021 due to unstable angina (balloon angioplasty to RCA and LAD for in-stent restenosis). TTE 10/2021 with mild abnormalities but EF normal. Patient reports one 15-20 second episode of non exertional chest pain similar to previous myocardial infarction pain, possible angina. No recurrence or exertional symptoms.  --discontinue ASA due to bruising. No recent exertional symptoms.   --continue clopidogrel (CYP2C19 *1/*17).  - Prescribe sublingual nitroglycerin for chest pain >15 seconds.  - Educate on nitroglycerin side effects, including headache.  - Monitor for recurrence or increased frequency. Discussed potential future catheterization if symptoms increase..     Peripheral vascular disease s/p iliac stents on Feb 2020    Angiography on 04/19/18 showed moderate right common iliac lesion (50%) with approximately a gradient and severe left common iliac disease with an 80% ostial stenosis. Underwent b/l stenting to iliac arteries on 08/02/2018. Symptoms improved after procedure, and continues to remain asymptomatic.   - Encouraged pt to continue exercise. Advised on 150 min/week or 30 min 5 days a week with set pace/exertional level.      Essential hypertension: BP today 132/68. States at home more consistently 120s systolic.    - continue home with lorsartan 100 mg daily, amlodipine 10 mg daily    Hyperlipidemia: LDL 186 10/2021, repeat 11/2021 LDL 81. Has been off statins due to prior issues with associated hyperglycemia and hesitant to restart. Repeat labs showing LDL 100, HgA1c improved off statin to 6.6. Evolocumab started and tolerating well.   - Continue ezetimibe 10 mg, evolocumab  - Repeat lipid panel today    DM- Most recent A1c 6.6 12/2022. Managed with pioglitazone, improved off statin. Though does not have history of HF, would likely benefit from alternative drug therapy for diabetes.     Former tobacco use - quit 07/30/13    Tension Headache  Tension-type headaches likely due to stress, fatigue, or vision strain. Tylenol provides relief. Discussed exacerbating factors.  - Recommend Tylenol for relief.  - Advise against ibuprofen due to interactions.  - Suggest follow-up with primary care if persistent.  - Advise on lifestyle modifications to reduce headaches.    Return in about 6 months (around 04/08/2024).      Subjective:  Reason for Visit: Follow-up      History of Present Illness  Dale Webb, a patient with a history of of  inferior STEMI in 2018, s/p four-vessel CABG LIMA-LAD, SVG-diagonal, SVG-OM1, SVG-RCA on 06/19/2017, essential hypertension, hyperlipidemia and OSA, with multiple PCIs presents for a follow-up visit.     He reports that his diabetes improved after discontinuing statin therapy and starting Repatha. He denies any side effects from Repatha. However, he is unsure if a recent cholesterol panel was completed during a physical in February. The last known cholesterol panel from July showed an LDL of 100, which was improving.    Dale Webb also reports an episode of chest pain that occurred about three to four weeks ago, which he describes as similar to a previous heart attack. The pain lasted about 15-20 seconds and then resolved on its own. He has not experienced another episode since. Dale Webb also reports new onset headaches over the past three to four months, which are not typical for him. He describes the headaches as across the forehead and sometimes extending to the back of the head. Over-the-counter Tylenol provides temporary relief, but the headaches return the next day.    Despite these symptoms, Dale Webb remains active. He recently played a full 18-hole round of golf without any chest pain or discomfort. He also reports trying to walk regularly, although recent family issues have made it more challenging to maintain this routine.       Cardiovascular History:  CAD   CABG  PCI  HTN  HLD  OSA  Prior tobacco use     Cardiovascular Studies Date Comments     ECG     Echo 06/17/17 - Left ventricle: Poor image quality possibl inferolateral  hypokinesis on apical views overall EF normal range. The cavity  size was normal. Wall thickness was increased in a pattern of  mild LVH. Systolic function was normal. The estimated ejection  fraction was in the range of 55% to 60%. Wall motion was normal;  there were no regional wall motion abnormalities.  - Mitral valve: Calcified annulus.  - Atrial septum: No defect or patent foramen ovale was identified.     Stress test     Cardiac catheterization   11/06/2021                                                                04/19/18                                     Findings:  Mild inferior and lateral hypokinesis with preserved LV systolic function  Elevated LVEDP at 24 mm Hg  Coronary artery disease including 60% mid-RCA instent restenosis, 70% distal RCA instent restenosis (FFR = 0.82), 80% proximal LAD stenosis (FFR = 0.82), 95% D1 stenosis, occluded mid-LAD (fills via LIMA graft) and occluded OM1 (fills via collaterals from the LIMA to LAD graft).    Successful balloon angioplasty of mid RCA instent restenosis   Successful balloon angioplasty of distal RCA instent restenosis  Successful balloon angioplasty of proximal LAD stenosis (stent not used because of proximity to origin of D1)  Prior coronary bypass surgery with occluded SVG to RCA, occluded SVG to  OM, occluded SVG to first diagonal and patent LIMA to LAD  Patent stents in right and left common iliac arteries         Coronary artery disease including subtotally occluded right coronary artery   Successful PCI of mid and distal RCA with placement of two Xience DES with excellent angiographic result and TIMI III flow.   Prior coronary bypass surgery with occluded SVG to RCA, occluded SVG to OM, occluded SVG to first diagonal and patent LIMA to LAD  Moderate right common iliac lesion (50%) with approximately a gradient.    Severe left common iliac disease with an 80% ostial stenosis  Low normal left ventricular filling pressures (LVEDP = 10 mm Hg).  Septal hypokinesis with preserved LV systolic function (estimated LVEF = 50%).          CYP2C19 Genotype 04/19/2018 *1/*19   Electrophysiology      Cardiovascular Surgery 06/19/2017 CABG x 4 (LIMA-LAD, SVG-DIAG, SVG-OM, SVG-DIST RCA) with EVH from RIGHT GREATER SAPHENOUS VEIN and LEFT INTERNAL MAMMARY ARTERY HARVEST by Dr. Nicanor Barge     Peripheral Vascular Studies     Anginal Equivalent       Relevant Past Medical History:    Patient's Medications   New Prescriptions    NITROGLYCERIN (NITROSTAT) 0.4 MG SL TABLET    Place 1 tablet (0.4 mg total) under the tongue every five (5) minutes as needed for chest pain. Maximum of 3 doses in 15 minutes.   Previous Medications ACETAMINOPHEN (TYLENOL) 325 MG TABLET    Take by mouth every four (4) hours as needed.    AMLODIPINE (NORVASC) 10 MG TABLET    Take 1 tablet by mouth once daily    B COMPLEX C 11/CALCIUM/DHA/Q10 (BRAIN MIGHT-DHA-CO Q10 ORAL)    Take 1 tablet by mouth in the morning.    CLOPIDOGREL (PLAVIX) 75 MG TABLET    Take 1 tablet by mouth once daily    COFFEE XT-PHOSPHATIDYL SERINE (NEURIVA ORIGINAL) 100-100 MG CAP    Take 1 capsule by mouth in the morning.    CYCLOBENZAPRINE (FLEXERIL) 5 MG TABLET    Take 1 tablet (5 mg total) by mouth Three (3) times a day as needed for muscle spasms.    EMPTY CONTAINER (SHARPS-A-GATOR DISPOSAL SYSTEM) MISC    Use as directed for sharps disposal    EVOLOCUMAB (REPATHA SURECLICK) 140 MG/ML PNIJ    Inject the contents of one pen (140 mg) under the skin every fourteen (14) days.    EZETIMIBE (ZETIA) 10 MG TABLET    Take 1 tablet by mouth once daily    LACTOBACILLUS RHAMNOSUS GG (CULTURELLE) 10 BILLION CELL CAPSULE    Take 1 capsule by mouth daily.    LOSARTAN (COZAAR) 50 MG TABLET    Take 2 tablets by mouth once daily    MULTIVIT-MIN/FOLIC ACID/LUTEIN (CENTRUM SILVER ORAL)    Take 1 tablet by mouth in the morning.    OXYCODONE (ROXICODONE) 5 MG IMMEDIATE RELEASE TABLET    Take 1 tablet (5 mg total) by mouth every four (4) hours as needed for pain for up to 30 doses.    OXYCODONE-ACETAMINOPHEN (PERCOCET) 5-325 MG PER TABLET    Take 1 tablet by mouth every six (6) hours as needed.    PANTOPRAZOLE (PROTONIX) 40 MG TABLET    Take 1 tablet (40 mg total) by mouth two (2) times a day.    PIOGLITAZONE (ACTOS) 30 MG TABLET    Take 1 tablet (30 mg total)  by mouth daily.   Modified Medications    No medications on file   Discontinued Medications    No medications on file       Allergies:  Allergies   Allergen Reactions    Lisinopril Cough    Codeine Nausea Only       Social History:  He  reports that he has quit smoking. His smoking use included cigars and cigarettes. He has never used smokeless tobacco. He reports that he does not drink alcohol and does not use drugs.    Family History:  His family history is not on file.    Review of Systems  10 systems were reviewed and negative except as noted in HPI.      Objective:       Physical Exam  BP 132/68 (BP Site: R Arm, BP Position: Sitting, BP Cuff Size: X-Large)  - Pulse 57  - Wt 93.4 kg (206 lb)  - SpO2 97%  - BMI 27.94 kg/m??    Wt Readings from Last 3 Encounters:   10/08/23 93.4 kg (206 lb)   04/23/23 92.1 kg (203 lb)   07/23/22 89.7 kg (197 lb 12 oz)       General:  Alert, no distress.   HEENT:  EOMI, sclerae anicteric, MMM.   Neck: Supple, no carotid bruit. JVP   Lungs:   CTAB bilaterally with normal WOB.   Heart:   Regular rate and rhythm, no murmurs, S1/S2 heard   Abdomen:   Soft, non-tender, non-distended.   Extremities: RLE in foot cast. No edema bilaterally.   Skin: No lesions/rashes.   Neurologic: No focal deficits.     Most recent labs     Lab Results   Component Value Date    WBC 4.6 11/06/2021    RBC 4.78 11/06/2021    HGB 14.9 11/06/2021    HCT 43.8 11/06/2021    MCV 91.7 11/06/2021    MCH 31.1 11/06/2021    MCHC 33.9 11/06/2021    RDW 13.5 11/06/2021    PLT 142 (L) 11/06/2021    MPV 10.3 11/06/2021       Lab Results   Component Value Date    NA 142 11/06/2021    K 4.5 11/06/2021    CL 110 (H) 11/06/2021    CO2 24.0 11/06/2021    BUN 8 (L) 11/06/2021    CREATININE 0.71 11/06/2021    GLU 117 (H) 11/06/2021    CALCIUM 9.9 11/06/2021       Lab Results   Component Value Date    CHOL 261 (H) 11/06/2021     Lab Results   Component Value Date    HDL 61 (H) 11/06/2021     Lab Results   Component Value Date    LDL 186 (H) 11/06/2021     Lab Results   Component Value Date    TRIG 71 11/06/2021     No components found for: [facility]    Lab Results   Component Value Date    PT 11.1 04/13/2018    INR 0.97 04/13/2018       Valery Gaucher, MD  Assistant Professor of Medicine  Division of Cardiology

## 2023-10-09 MED ORDER — EZETIMIBE 10 MG TABLET
ORAL_TABLET | Freq: Every day | ORAL | 0 refills | 90.00 days | Status: CP
Start: 2023-10-09 — End: ?

## 2023-10-09 NOTE — Unmapped (Signed)
 Refill request received for patient.      Medication Requested: Zetia  10 mg tablet  Last Office Visit: 10/08/2023  Next Office Visit: Visit date not found  Last Prescriber: Deadra Everts    Nurse refill requirements met? Yes  If not met, why:     Sent to: Pharmacy per protocol  If sent to provider, which provider?:

## 2023-10-28 MED ORDER — AMLODIPINE 10 MG TABLET
ORAL_TABLET | Freq: Every day | ORAL | 0 refills | 90.00000 days | Status: CP
Start: 2023-10-28 — End: ?

## 2023-10-28 NOTE — Unmapped (Signed)
 Refill request received for patient.      Medication Requested: Amlodipine  Last Office Visit: Visit date not found   Next Office Visit: Visit date not found  Last Prescriber: Dr Kari Otto    Nurse refill requirements met? No  If not met, why: No visit date found    Sent to: Provider for signing  If sent to provider, which provider?: Dr Kari Otto

## 2023-10-29 MED ORDER — LOSARTAN 50 MG TABLET
ORAL_TABLET | Freq: Every day | ORAL | 3 refills | 90.00000 days | Status: CP
Start: 2023-10-29 — End: ?

## 2023-10-29 NOTE — Unmapped (Signed)
 Refill request received by [facility] for patient.      Medication Requested: losartan  Last Office Visit: 10/08/2023 Pauline Bos)  Next Office Visit: Visit date not found  Last Prescriber: Valery Gaucher    Nurse refill requirements met? No  If not met, why: Pt followed by [facility] Johney Nageotte to: Provider for signing  If sent to provider, which provider?: Valery Gaucher

## 2023-11-02 ENCOUNTER — Other Ambulatory Visit: Payer: Self-pay

## 2023-11-02 ENCOUNTER — Emergency Department (HOSPITAL_COMMUNITY)
Admission: EM | Admit: 2023-11-02 | Discharge: 2023-11-02 | Disposition: A | Discharge status: Home / Self Care | Attending: Emergency Medicine | Admitting: Emergency Medicine

## 2023-11-02 ENCOUNTER — Emergency Department (HOSPITAL_COMMUNITY)

## 2023-11-02 ENCOUNTER — Encounter (HOSPITAL_COMMUNITY): Payer: Self-pay

## 2023-11-02 DIAGNOSIS — J432 Centrilobular emphysema: Secondary | ICD-10-CM | POA: Insufficient documentation

## 2023-11-02 DIAGNOSIS — I2510 Atherosclerotic heart disease of native coronary artery without angina pectoris: Secondary | ICD-10-CM | POA: Insufficient documentation

## 2023-11-02 DIAGNOSIS — M542 Cervicalgia: Secondary | ICD-10-CM | POA: Insufficient documentation

## 2023-11-02 DIAGNOSIS — Y92410 Unspecified street and highway as the place of occurrence of the external cause: Secondary | ICD-10-CM | POA: Diagnosis not present

## 2023-11-02 DIAGNOSIS — I10 Essential (primary) hypertension: Secondary | ICD-10-CM | POA: Insufficient documentation

## 2023-11-02 DIAGNOSIS — I700 Atherosclerosis of aorta: Secondary | ICD-10-CM | POA: Insufficient documentation

## 2023-11-02 DIAGNOSIS — S0990XA Unspecified injury of head, initial encounter: Secondary | ICD-10-CM | POA: Insufficient documentation

## 2023-11-02 DIAGNOSIS — R0789 Other chest pain: Secondary | ICD-10-CM | POA: Diagnosis not present

## 2023-11-02 DIAGNOSIS — Z79899 Other long term (current) drug therapy: Secondary | ICD-10-CM | POA: Insufficient documentation

## 2023-11-02 DIAGNOSIS — M25531 Pain in right wrist: Secondary | ICD-10-CM | POA: Insufficient documentation

## 2023-11-02 DIAGNOSIS — E119 Type 2 diabetes mellitus without complications: Secondary | ICD-10-CM | POA: Diagnosis not present

## 2023-11-02 DIAGNOSIS — Z7902 Long term (current) use of antithrombotics/antiplatelets: Secondary | ICD-10-CM | POA: Insufficient documentation

## 2023-11-02 DIAGNOSIS — K4020 Bilateral inguinal hernia, without obstruction or gangrene, not specified as recurrent: Secondary | ICD-10-CM | POA: Insufficient documentation

## 2023-11-02 LAB — CBC
HCT: 46.6 % (ref 39.0–52.0)
Hemoglobin: 15.7 g/dL (ref 13.0–17.0)
MCH: 31.8 pg (ref 26.0–34.0)
MCHC: 33.7 g/dL (ref 30.0–36.0)
MCV: 94.3 fL (ref 80.0–100.0)
Platelets: 179 10*3/uL (ref 150–400)
RBC: 4.94 MIL/uL (ref 4.22–5.81)
RDW: 13.1 % (ref 11.5–15.5)
WBC: 7.5 10*3/uL (ref 4.0–10.5)
nRBC: 0 % (ref 0.0–0.2)

## 2023-11-02 LAB — COMPREHENSIVE METABOLIC PANEL WITH GFR
ALT: 26 U/L (ref 0–44)
AST: 22 U/L (ref 15–41)
Albumin: 4.1 g/dL (ref 3.5–5.0)
Alkaline Phosphatase: 79 U/L (ref 38–126)
Anion gap: 11 (ref 5–15)
BUN: 17 mg/dL (ref 8–23)
CO2: 22 mmol/L (ref 22–32)
Calcium: 10 mg/dL (ref 8.9–10.3)
Chloride: 104 mmol/L (ref 98–111)
Creatinine, Ser: 0.88 mg/dL (ref 0.61–1.24)
GFR, Estimated: >60 mL/min (ref >60)
Glucose, Bld: 115 mg/dL — ABNORMAL HIGH (ref 70–99)
Potassium: 3.8 mmol/L (ref 3.5–5.1)
Sodium: 137 mmol/L (ref 135–145)
Total Bilirubin: 0.6 mg/dL (ref 0.0–1.2)
Total Protein: 6.7 g/dL (ref 6.5–8.1)

## 2023-11-02 LAB — URINALYSIS, ROUTINE W REFLEX MICROSCOPIC
Bilirubin Urine: NEGATIVE
Glucose, UA: NEGATIVE mg/dL
Hgb urine dipstick: NEGATIVE
Ketones, ur: NEGATIVE mg/dL
Leukocytes,Ua: NEGATIVE
Nitrite: NEGATIVE
Protein, ur: NEGATIVE mg/dL
Specific Gravity, Urine: >1.046 — ABNORMAL HIGH (ref 1.005–1.030)
pH: 6 (ref 5.0–8.0)

## 2023-11-02 LAB — PROTIME-INR
INR: 0.9 (ref 0.8–1.2)
Prothrombin Time: 12.6 s (ref 11.4–15.2)

## 2023-11-02 LAB — MAGNESIUM: Magnesium: 2 mg/dL (ref 1.7–2.4)

## 2023-11-02 MED ORDER — IOHEXOL 300 MG/ML  SOLN
100.0000 mL | Freq: Once | INTRAMUSCULAR | Status: AC | PRN
  Administered 2023-11-02: 100 mL via INTRAVENOUS

## 2023-11-02 MED ORDER — ACETAMINOPHEN 325 MG PO TABS
650.0000 mg | ORAL_TABLET | Freq: Once | ORAL | Status: AC
  Administered 2023-11-02: 650 mg via ORAL
  Filled 2023-11-02: qty 2
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
## 2023-11-02 NOTE — ED Notes (Signed)
 Pt asked if he would be able to provide a urine sample. Pt was unsure. This nurse told him that to press the red button when he thinks he could go.

## 2023-11-02 NOTE — Discharge Instructions (Signed)
 Your test results today are reassuring.  Your imaging studies did not show any major injuries.  Take ibuprofen and Tylenol  as needed for pain and soreness.
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
## 2023-11-02 NOTE — ED Notes (Signed)
 Patient transported to CT

## 2023-11-21 DIAGNOSIS — E785 Hyperlipidemia, unspecified: Principal | ICD-10-CM

## 2023-11-21 DIAGNOSIS — Z789 Other specified health status: Principal | ICD-10-CM

## 2023-11-21 DIAGNOSIS — I2510 Atherosclerotic heart disease of native coronary artery without angina pectoris: Principal | ICD-10-CM

## 2023-11-21 MED ORDER — REPATHA SURECLICK 140 MG/ML SUBCUTANEOUS PEN INJECTOR
SUBCUTANEOUS | 1 refills | 84.00000 days
Start: 2023-11-21 — End: ?

## 2023-11-24 NOTE — Unmapped (Signed)
 Refill request received for patient.      Medication Requested: evolocumab  Last Office Visit: 10/08/23-skaf [place]  Next Office Visit: Visit date not found  Last Prescriber: skaf    Nurse refill requirements met? No  If not met, why:     Sent to: Provider for signing  If sent to provider, which provider?: skaf

## 2023-11-25 MED ORDER — REPATHA SURECLICK 140 MG/ML SUBCUTANEOUS PEN INJECTOR
SUBCUTANEOUS | 1 refills | 84.00000 days | Status: CP
Start: 2023-11-25 — End: ?
  Filled 2023-11-25: qty 6, 84d supply, fill #0

## 2023-12-27 MED ORDER — CLOPIDOGREL 75 MG TABLET
ORAL_TABLET | Freq: Every day | ORAL | 0 refills | 0.00000 days
Start: 2023-12-27 — End: ?

## 2023-12-28 MED ORDER — CLOPIDOGREL 75 MG TABLET
ORAL_TABLET | Freq: Every day | ORAL | 3 refills | 90.00000 days | Status: CP
Start: 2023-12-28 — End: ?

## 2023-12-28 NOTE — Unmapped (Signed)
 Refill request received by [facility] for patient.      Medication Requested: clopidogrel   Last Office Visit: Visit date not found   Next Office Visit: Visit date not found  Last Prescriber: Zachary Car    Nurse refill requirements met? No  If not met, why: Pt followed by [facility]iology Linn Spear to: Provider for signing  If sent to provider, which provider?: Toni Cool

## 2024-01-11 MED ORDER — EZETIMIBE 10 MG TABLET
ORAL_TABLET | Freq: Every day | ORAL | 2 refills | 90.00000 days | Status: CP
Start: 2024-01-11 — End: ?

## 2024-01-11 NOTE — Unmapped (Signed)
 Refill request received for patient.      Medication Requested: ezetimibe   Last Office Visit: Visit date not found   Next Office Visit: Visit date not found  Last Prescriber: Toni Cool     Nurse refill requirements met? No  If not met, why: Pt followed by [facility] Linn Spear to: Provider for signing  If sent to provider, which provider?: Zachary Car

## 2024-01-24 MED ORDER — AMLODIPINE 10 MG TABLET
ORAL_TABLET | Freq: Every day | ORAL | 0 refills | 0.00000 days
Start: 2024-01-24 — End: ?

## 2024-01-25 MED ORDER — AMLODIPINE 10 MG TABLET
ORAL_TABLET | Freq: Every day | ORAL | 2 refills | 90.00000 days | Status: CP
Start: 2024-01-25 — End: ?

## 2024-01-25 NOTE — Unmapped (Signed)
 Electronic refill request received by [facility] Cardiology for patient.      Medication Requested: amlodipine   Last Office Visit: Visit date not found   Next Office Visit: 04/07/2024 Genette Patricia Childes)  Last Prescriber: Zachary Patricia    Nurse refill requirements met? No  If not met, why: Pt followed by [facility]iology Childes Spear to: Provider for signing  If sent to provider, which provider?: Zachary Patricia

## 2024-02-17 MED FILL — REPATHA SURECLICK 140 MG/ML SUBCUTANEOUS PEN INJECTOR: SUBCUTANEOUS | 84 days supply | Qty: 6 | Fill #1

## 2024-03-23 NOTE — Unmapped (Addendum)
 DIVISION OF CARDIOLOGY  [facility] [employer] [facility] , Gen[facility] [place]        Date of Service: 03/24/2024    Return Patient Clinic Note    PCP: Referring Provider:   Nadeen Glenn  [address] [place] [address]  Phone: [phone]  Fax: [facility], Glenn  [address]  [place],  [place] [address]  Phone: [phone]  Fax:      Assessment and Plan:      Dale Webb is a 68 y.o. male w/ PMHx of coronary artery disease and a history of inferior STEMI in 2018, s/p four-vessel CABG LIMA-LAD, SVG-diagonal, SVG-OM1, SVG-RCA on 06/19/2017 (all 3 SVGs known to be occluded), essential hypertension, hyperlipidemia and OSA, underwent left heart catheterization on April 19, 2018, received 2X DES to RCA, b/l iliac stenting on 08/02/2018.  PCI to LAD and RCA in 10/2021 with improvement of symptoms, presents today to follow up.    Coronary artery disease  Patient initially presented to [facility] on December 2018 for chest pain, was found to have inferior STEMI and subsequently underwent CABG x 4 (LIMA-LAD, SVG-DIAG, SVG-OM, SVG-DIST RCA). He received successful PCI of mid and distal RCA with placement of 2 DES in 2019. Most recently, underwent PCI 10/2021 due to unstable angina (balloon angioplasty to RCA and LAD for in-stent restenosis). TTE 10/2021 with mild abnormalities but EF normal. No recurrence or exertional symptoms. Tolerating daily activities well. Has not had to use PRN SL nitro.  --continue clopidogrel  (CYP2C19 *1/*17).  - PRN sublingual nitroglycerin  for chest pain >15 seconds.  - Monitor for recurrence or increased frequency. Discussed potential future catheterization if symptoms increase..     Peripheral vascular disease s/p iliac stents on Feb 2020    Angiography on 04/19/18 showed moderate right common iliac lesion (50%) with approximately a gradient and severe left common iliac disease with an 80% ostial stenosis. Underwent b/l stenting to iliac arteries on 08/02/2018. Symptoms improved after procedure, and continues to remain asymptomatic. Denies any leg fatigue, pain, or cramping.   - Encouraged pt to continue exercise. Advised on 150 min/week or 30 min 5 days a week with set pace/exertional level.      Essential hypertension:   BP today 123/67.  - continue losartan  100 mg daily, amlodipine  10 mg daily    Hyperlipidemia:   Most recent LDL 110 on repatha  and zetia . Has been off statins due to prior issues with associated hyperglycemia and hesitant to restart. Would like to repeat lipids next visit, if LDL remains above goal, may consider retrialing statin versus bempadoic acid.   - Continue ezetimibe  10 mg, evolocumab   [ ]  repeat lipids next visit    DM- Most recent A1c 6.6 12/2022. Managed with pioglitazone, improved off statin. Though does not have history of HF, would likely benefit from alternative drug therapy for diabetes.     Former tobacco use    Quit 07/30/13    Return in about 6 months (around 09/21/2024).      Subjective:        Reason for Visit: Follow-up    History of Present Illness  Dale Webb, a patient with a history of of  inferior STEMI in 2018, s/p four-vessel CABG LIMA-LAD, SVG-diagonal, SVG-OM1, SVG-RCA on 06/19/2017, essential hypertension, hyperlipidemia and OSA, with multiple PCIs presents for a follow-up visit.     Interval history:  Since his last visit, Dale Webb has been doing well. He maintains an active  lifestyle, including playing golf and babysitting his two-year-old grandchild. He doesn't experience significant shortness of breath unless he engages in strenuous exertion, such as carrying bottles of water up stairs. No chest pain, palpitations, or leg swelling. He has not had to use his PRN nitro. He is not limited in any of his activities.     He is on losartan  and amlodipine  for hypertension, with no issues of hypotension or dizziness.    He has a history of diabetes, with a recent hemoglobin A1c of 6.4, indicating well-controlled diabetes. He is on pioglitazone 30 mg and has previously had issues with metformin affecting his kidneys. He is on Repatha  and Zetia  for hyperlipidemia. Recent LDL 110. Previously hesitant about statins with concern it was worsening his diabetes.        Cardiovascular History:  CAD   CABG  PCI  HTN  HLD  OSA  Prior tobacco use     Cardiovascular Studies Date Comments     ECG     Echo 10/2021 Summary    1. The left ventricle is normal in size with mildly increased wall  thickness.    2. The left ventricular systolic function is normal, LVEF is visually  estimated at > 55%.    3. There is grade I diastolic dysfunction (impaired relaxation).    4. Mitral annular calcification is present (mild).    5. The aortic valve is trileaflet with mildly thickened leaflets with normal  excursion.    6. The left atrium is mildly dilated in size.    7. The right ventricle is normal in size, with normal systolic function.    8. The right atrium is mildly dilated  in size.    9. Dilated pulmonary artery - mild.     Stress test     Cardiac catheterization   11/06/2021                                                                04/19/18                                     Findings:  Mild inferior and lateral hypokinesis with preserved LV systolic function  Elevated LVEDP at 24 mm Hg  Coronary artery disease including 60% mid-RCA instent restenosis, 70% distal RCA instent restenosis (FFR = 0.82), 80% proximal LAD stenosis (FFR = 0.82), 95% D1 stenosis, occluded mid-LAD (fills via LIMA graft) and occluded OM1 (fills via collaterals from the LIMA to LAD graft).    Successful balloon angioplasty of mid RCA instent restenosis   Successful balloon angioplasty of distal RCA instent restenosis  Successful balloon angioplasty of proximal LAD stenosis (stent not used because of proximity to origin of D1)  Prior coronary bypass surgery with occluded SVG to RCA, occluded SVG to OM, occluded SVG to first diagonal and patent LIMA to LAD  Patent stents in right and left common iliac arteries         Coronary artery disease including subtotally occluded right coronary artery   Successful PCI of mid and distal RCA with placement of two Xience DES with excellent angiographic result and TIMI III flow.   Prior coronary bypass surgery with  occluded SVG to RCA, occluded SVG to OM, occluded SVG to first diagonal and patent LIMA to LAD  Moderate right common iliac lesion (50%) with approximately a gradient.    Severe left common iliac disease with an 80% ostial stenosis  Low normal left ventricular filling pressures (LVEDP = 10 mm Hg).  Septal hypokinesis with preserved LV systolic function (estimated LVEF = 50%).          CYP2C19 Genotype 04/19/2018 *1/*19   Electrophysiology      Cardiovascular Surgery 06/19/2017 CABG x 4 (LIMA-LAD, SVG-DIAG, SVG-OM, SVG-DIST RCA) with EVH from RIGHT GREATER SAPHENOUS VEIN and LEFT INTERNAL MAMMARY ARTERY HARVEST by Dr. Army     Peripheral Vascular Studies     Anginal Equivalent       Relevant Past Medical History:    Patient's Medications   New Prescriptions    No medications on file   Previous Medications    ACETAMINOPHEN  (TYLENOL ) 325 MG TABLET    Take by mouth every four (4) hours as needed.    AMLODIPINE  (NORVASC ) 10 MG TABLET    Take 1 tablet by mouth once daily    B COMPLEX C 11/CALCIUM/DHA/Q10 (BRAIN MIGHT-DHA-CO Q10 ORAL)    Take 1 tablet by mouth in the morning.    CLOPIDOGREL  (PLAVIX ) 75 MG TABLET    Take 1 tablet by mouth once daily    COFFEE XT-PHOSPHATIDYL SERINE (NEURIVA ORIGINAL) 100-100 MG CAP    Take 1 capsule by mouth in the morning.    CYCLOBENZAPRINE (FLEXERIL) 5 MG TABLET    Take 1 tablet (5 mg total) by mouth Three (3) times a day as needed for muscle spasms.    EMPTY CONTAINER (SHARPS-A-GATOR DISPOSAL SYSTEM) MISC    Use as directed for sharps disposal    EVOLOCUMAB  (REPATHA  SURECLICK) 140 MG/ML PNIJ    Inject the contents of one pen (140 mg) under the skin every fourteen (14) days.    EZETIMIBE  (ZETIA ) 10 MG TABLET    Take 1 tablet (10 mg total) by mouth daily.    LACTOBACILLUS RHAMNOSUS GG (CULTURELLE) 10 BILLION CELL CAPSULE    Take 1 capsule by mouth daily.    LOSARTAN  (COZAAR ) 50 MG TABLET    Take 2 tablets by mouth once daily    MULTIVIT-MIN/FOLIC ACID/LUTEIN (CENTRUM SILVER ORAL)    Take 1 tablet by mouth in the morning.    NITROGLYCERIN  (NITROSTAT ) 0.4 MG SL TABLET    Place 1 tablet (0.4 mg total) under the tongue every five (5) minutes as needed for chest pain. Maximum of 3 doses in 15 minutes.    OXYCODONE  (ROXICODONE ) 5 MG IMMEDIATE RELEASE TABLET    Take 1 tablet (5 mg total) by mouth every four (4) hours as needed for pain for up to 30 doses.    OXYCODONE -ACETAMINOPHEN  (PERCOCET) 5-325 MG PER TABLET    Take 1 tablet by mouth every six (6) hours as needed.    PANTOPRAZOLE (PROTONIX) 40 MG TABLET    Take 1 tablet (40 mg total) by mouth two (2) times a day.    PIOGLITAZONE (ACTOS) 30 MG TABLET    Take 1 tablet (30 mg total) by mouth daily.   Modified Medications    No medications on file   Discontinued Medications    No medications on file       Allergies:  Allergies   Allergen Reactions    Lisinopril Cough    Codeine Nausea Only       Social  History:  He  reports that he has quit smoking. His smoking use included cigars and cigarettes. He has never used smokeless tobacco. He reports that he does not drink alcohol and does not use drugs.    Family History:  His family history is not on file.    Review of Systems  10 systems were reviewed and negative except as noted in HPI.      Objective:       Physical Exam  BP 123/67 (BP Site: R Arm, BP Position: Sitting, BP Cuff Size: X-Large)  - Pulse 60  - Wt 90.9 kg (200 lb 6.4 oz)  - SpO2 94%  - BMI 27.18 kg/m??    Wt Readings from Last 3 Encounters:   03/24/24 90.9 kg (200 lb 6.4 oz)   10/08/23 93.4 kg (206 lb)   04/23/23 92.1 kg (203 lb)       General:  Alert, no distress.   HEENT:  EOMI, sclerae anicteric, MMM.   Neck: Supple.   Lungs:   CTAB bilaterally with normal WOB.   Heart:   Regular rate and rhythm, no murmurs, S1/S2 heard   Abdomen:   Soft, non-tender, non-distended.   Extremities: No edema bilaterally.   Skin: No lesions/rashes.   Neurologic: No focal deficits.     Most recent labs     Lab Results   Component Value Date    WBC 4.6 11/06/2021    RBC 4.78 11/06/2021    HGB 14.9 11/06/2021    HCT 43.8 11/06/2021    MCV 91.7 11/06/2021    MCH 31.1 11/06/2021    MCHC 33.9 11/06/2021    RDW 13.5 11/06/2021    PLT 142 (L) 11/06/2021    MPV 10.3 11/06/2021       Lab Results   Component Value Date    NA 142 11/06/2021    K 4.5 11/06/2021    CL 110 (H) 11/06/2021    CO2 24.0 11/06/2021    BUN 8 (L) 11/06/2021    CREATININE 0.71 11/06/2021    GLU 117 (H) 11/06/2021    CALCIUM 9.9 11/06/2021       Lab Results   Component Value Date    CHOL 261 (H) 11/06/2021     Lab Results   Component Value Date    HDL 61 (H) 11/06/2021     Lab Results   Component Value Date    LDL 186 (H) 11/06/2021     Lab Results   Component Value Date    TRIG 71 11/06/2021     No components found for: [facility]    Lab Results   Component Value Date    PT 11.1 04/13/2018    INR 0.97 04/13/2018       Ileana Daring, MD  Cardiology fellow PGY-4    Teaching Physician Note  I saw the patient with the Resident. I discussed the findings, assessment and plan with the Resident and agree with the findings and plan as documented in the Resident's note.    Zachary Car MD

## 2024-03-24 DIAGNOSIS — I2510 Atherosclerotic heart disease of native coronary artery without angina pectoris: Principal | ICD-10-CM

## 2024-03-24 DIAGNOSIS — I739 Peripheral vascular disease, unspecified: Principal | ICD-10-CM

## 2024-03-24 DIAGNOSIS — I10 Essential (primary) hypertension: Principal | ICD-10-CM

## 2024-03-24 DIAGNOSIS — I771 Stricture of artery: Principal | ICD-10-CM

## 2024-03-24 DIAGNOSIS — E782 Mixed hyperlipidemia: Principal | ICD-10-CM

## 2024-05-05 DIAGNOSIS — E785 Hyperlipidemia, unspecified: Principal | ICD-10-CM

## 2024-05-05 DIAGNOSIS — Z789 Other specified health status: Principal | ICD-10-CM

## 2024-05-05 DIAGNOSIS — I2510 Atherosclerotic heart disease of native coronary artery without angina pectoris: Principal | ICD-10-CM

## 2024-05-05 MED ORDER — REPATHA SURECLICK 140 MG/ML SUBCUTANEOUS PEN INJECTOR
SUBCUTANEOUS | 1 refills | 84.00000 days
Start: 2024-05-05 — End: ?

## 2024-05-05 NOTE — Telephone Encounter (Signed)
 Refill request received for patient.      Medication Requested: evolocumab   Last Office Visit: 03/24/24-[facility]  Next Office Visit: Visit date not found  Last Prescriber: skaf    Nurse refill requirements met? No  If not met, why: Patient comment: used last one today 05-05-2024     Sent to: Provider for signing  If sent to provider, which provider?: stouffer

## 2024-05-07 MED ORDER — REPATHA SURECLICK 140 MG/ML SUBCUTANEOUS PEN INJECTOR
SUBCUTANEOUS | 1 refills | 84.00000 days | Status: CP
Start: 2024-05-07 — End: ?
  Filled 2024-05-10: qty 6, 84d supply, fill #0
